# Patient Record
Sex: Female | Born: 1935
Health system: Southern US, Community
[De-identification: ages and names within clinical notes are randomized; demographics above are authoritative.]

## PROBLEM LIST (undated history)

## (undated) DIAGNOSIS — C50919 Malignant neoplasm of unspecified site of unspecified female breast: Secondary | ICD-10-CM

## (undated) DIAGNOSIS — I10 Essential (primary) hypertension: Secondary | ICD-10-CM

## (undated) DIAGNOSIS — E039 Hypothyroidism, unspecified: Secondary | ICD-10-CM

## (undated) DIAGNOSIS — E785 Hyperlipidemia, unspecified: Secondary | ICD-10-CM

## (undated) DIAGNOSIS — M1990 Unspecified osteoarthritis, unspecified site: Secondary | ICD-10-CM

## (undated) DIAGNOSIS — I219 Acute myocardial infarction, unspecified: Secondary | ICD-10-CM

## (undated) DIAGNOSIS — K5730 Diverticulosis of large intestine without perforation or abscess without bleeding: Secondary | ICD-10-CM

## (undated) DIAGNOSIS — K635 Polyp of colon: Secondary | ICD-10-CM

## (undated) DIAGNOSIS — I2510 Atherosclerotic heart disease of native coronary artery without angina pectoris: Secondary | ICD-10-CM

## (undated) DIAGNOSIS — D649 Anemia, unspecified: Secondary | ICD-10-CM

## (undated) HISTORY — DX: Atherosclerotic heart disease of native coronary artery without angina pectoris: I25.10

## (undated) HISTORY — DX: Anemia, unspecified: D64.9

## (undated) HISTORY — PX: HEMORRHOID SURGERY: SHX153

## (undated) HISTORY — PX: CHOLECYSTECTOMY: SHX55

## (undated) HISTORY — PX: TRANSTHORACIC ECHOCARDIOGRAM: SHX275

## (undated) HISTORY — DX: Hypothyroidism, unspecified: E03.9

## (undated) HISTORY — DX: Malignant neoplasm of unspecified site of unspecified female breast: C50.919

## (undated) HISTORY — PX: ABDOMINAL HYSTERECTOMY: SHX81

## (undated) HISTORY — DX: Hyperlipidemia, unspecified: E78.5

## (undated) HISTORY — DX: Polyp of colon: K63.5

## (undated) HISTORY — DX: Essential (primary) hypertension: I10

## (undated) HISTORY — DX: Acute myocardial infarction, unspecified: I21.9

## (undated) HISTORY — PX: COLONOSCOPY: SHX174

---

## 1971-06-12 DIAGNOSIS — I219 Acute myocardial infarction, unspecified: Secondary | ICD-10-CM

## 1971-06-12 HISTORY — DX: Acute myocardial infarction, unspecified: I21.9

## 2001-06-18 ENCOUNTER — Other Ambulatory Visit: Admission: RE | Admit: 2001-06-18 | Discharge: 2001-06-18 | Payer: Self-pay | Admitting: Family Medicine

## 2001-06-24 ENCOUNTER — Ambulatory Visit (HOSPITAL_COMMUNITY): Admission: RE | Admit: 2001-06-24 | Discharge: 2001-06-24 | Payer: Self-pay | Admitting: Family Medicine

## 2001-06-24 ENCOUNTER — Encounter: Payer: Self-pay | Admitting: Family Medicine

## 2001-07-08 ENCOUNTER — Encounter: Payer: Self-pay | Admitting: Family Medicine

## 2001-07-08 ENCOUNTER — Ambulatory Visit (HOSPITAL_COMMUNITY): Admission: RE | Admit: 2001-07-08 | Discharge: 2001-07-08 | Payer: Self-pay | Admitting: Family Medicine

## 2001-07-15 ENCOUNTER — Encounter: Payer: Self-pay | Admitting: Otolaryngology

## 2001-07-15 ENCOUNTER — Encounter: Admission: RE | Admit: 2001-07-15 | Discharge: 2001-07-15 | Payer: Self-pay | Admitting: Otolaryngology

## 2001-08-05 ENCOUNTER — Ambulatory Visit (HOSPITAL_COMMUNITY): Admission: RE | Admit: 2001-08-05 | Discharge: 2001-08-05 | Payer: Self-pay | Admitting: Cardiovascular Disease

## 2001-08-05 ENCOUNTER — Ambulatory Visit (HOSPITAL_COMMUNITY): Admission: RE | Admit: 2001-08-05 | Discharge: 2001-08-05 | Payer: Self-pay | Admitting: Cardiology

## 2001-08-05 ENCOUNTER — Encounter: Payer: Self-pay | Admitting: Cardiovascular Disease

## 2002-04-28 ENCOUNTER — Emergency Department (HOSPITAL_COMMUNITY): Admission: EM | Admit: 2002-04-28 | Discharge: 2002-04-28 | Payer: Self-pay | Admitting: *Deleted

## 2002-04-28 ENCOUNTER — Encounter: Payer: Self-pay | Admitting: *Deleted

## 2002-07-01 ENCOUNTER — Ambulatory Visit (HOSPITAL_COMMUNITY): Admission: RE | Admit: 2002-07-01 | Discharge: 2002-07-01 | Payer: Self-pay | Admitting: Internal Medicine

## 2002-07-01 ENCOUNTER — Encounter: Payer: Self-pay | Admitting: Internal Medicine

## 2002-07-16 ENCOUNTER — Ambulatory Visit (HOSPITAL_COMMUNITY): Admission: RE | Admit: 2002-07-16 | Discharge: 2002-07-16 | Payer: Self-pay | Admitting: Internal Medicine

## 2002-07-16 ENCOUNTER — Encounter: Payer: Self-pay | Admitting: Internal Medicine

## 2003-06-14 ENCOUNTER — Ambulatory Visit (HOSPITAL_COMMUNITY): Admission: RE | Admit: 2003-06-14 | Discharge: 2003-06-14 | Payer: Self-pay | Admitting: Internal Medicine

## 2003-07-07 ENCOUNTER — Inpatient Hospital Stay (HOSPITAL_COMMUNITY): Admission: RE | Admit: 2003-07-07 | Discharge: 2003-07-09 | Payer: Self-pay | Admitting: General Surgery

## 2004-04-12 ENCOUNTER — Ambulatory Visit (HOSPITAL_COMMUNITY): Admission: RE | Admit: 2004-04-12 | Discharge: 2004-04-12 | Payer: Self-pay | Admitting: Internal Medicine

## 2004-04-17 ENCOUNTER — Ambulatory Visit (HOSPITAL_COMMUNITY): Admission: RE | Admit: 2004-04-17 | Discharge: 2004-04-17 | Payer: Self-pay | Admitting: Internal Medicine

## 2004-05-11 ENCOUNTER — Ambulatory Visit (HOSPITAL_COMMUNITY): Admission: RE | Admit: 2004-05-11 | Discharge: 2004-05-11 | Payer: Self-pay | Admitting: General Surgery

## 2004-06-11 DIAGNOSIS — I2510 Atherosclerotic heart disease of native coronary artery without angina pectoris: Secondary | ICD-10-CM

## 2004-06-11 HISTORY — PX: CARDIAC CATHETERIZATION: SHX172

## 2004-06-11 HISTORY — DX: Atherosclerotic heart disease of native coronary artery without angina pectoris: I25.10

## 2004-07-18 ENCOUNTER — Observation Stay (HOSPITAL_COMMUNITY): Admission: RE | Admit: 2004-07-18 | Discharge: 2004-07-19 | Payer: Self-pay | Admitting: General Surgery

## 2005-01-12 ENCOUNTER — Ambulatory Visit (HOSPITAL_COMMUNITY): Admission: RE | Admit: 2005-01-12 | Discharge: 2005-01-12 | Payer: Self-pay | Admitting: Internal Medicine

## 2005-01-22 ENCOUNTER — Ambulatory Visit (HOSPITAL_COMMUNITY): Admission: RE | Admit: 2005-01-22 | Discharge: 2005-01-22 | Payer: Self-pay | Admitting: Cardiovascular Disease

## 2005-01-24 ENCOUNTER — Ambulatory Visit (HOSPITAL_COMMUNITY): Admission: RE | Admit: 2005-01-24 | Discharge: 2005-01-24 | Payer: Self-pay | Admitting: Cardiovascular Disease

## 2005-10-04 ENCOUNTER — Ambulatory Visit (HOSPITAL_COMMUNITY): Admission: RE | Admit: 2005-10-04 | Discharge: 2005-10-04 | Payer: Self-pay | Admitting: Internal Medicine

## 2006-06-11 HISTORY — PX: LAPAROSCOPIC LYSIS OF ADHESIONS: SHX5905

## 2006-10-16 ENCOUNTER — Ambulatory Visit (HOSPITAL_COMMUNITY): Admission: RE | Admit: 2006-10-16 | Discharge: 2006-10-16 | Payer: Self-pay | Admitting: Internal Medicine

## 2007-01-01 ENCOUNTER — Ambulatory Visit (HOSPITAL_COMMUNITY): Admission: RE | Admit: 2007-01-01 | Discharge: 2007-01-02 | Payer: Self-pay | Admitting: General Surgery

## 2007-06-16 ENCOUNTER — Ambulatory Visit (HOSPITAL_COMMUNITY): Admission: RE | Admit: 2007-06-16 | Discharge: 2007-06-16 | Payer: Self-pay | Admitting: Internal Medicine

## 2008-01-28 ENCOUNTER — Ambulatory Visit (HOSPITAL_COMMUNITY): Admission: RE | Admit: 2008-01-28 | Discharge: 2008-01-28 | Payer: Self-pay | Admitting: Internal Medicine

## 2008-02-03 ENCOUNTER — Ambulatory Visit (HOSPITAL_COMMUNITY): Admission: RE | Admit: 2008-02-03 | Discharge: 2008-02-03 | Payer: Self-pay | Admitting: Internal Medicine

## 2008-03-01 ENCOUNTER — Ambulatory Visit (HOSPITAL_COMMUNITY): Admission: RE | Admit: 2008-03-01 | Discharge: 2008-03-01 | Payer: Self-pay | Admitting: Internal Medicine

## 2008-06-21 ENCOUNTER — Ambulatory Visit (HOSPITAL_COMMUNITY): Admission: RE | Admit: 2008-06-21 | Discharge: 2008-06-21 | Payer: Self-pay | Admitting: Family Medicine

## 2009-02-16 ENCOUNTER — Ambulatory Visit (HOSPITAL_COMMUNITY): Admission: RE | Admit: 2009-02-16 | Discharge: 2009-02-16 | Payer: Self-pay | Admitting: Internal Medicine

## 2009-06-11 DIAGNOSIS — C50919 Malignant neoplasm of unspecified site of unspecified female breast: Secondary | ICD-10-CM

## 2009-06-11 HISTORY — PX: MASTECTOMY: SHX3

## 2009-06-11 HISTORY — DX: Malignant neoplasm of unspecified site of unspecified female breast: C50.919

## 2009-10-06 ENCOUNTER — Ambulatory Visit (HOSPITAL_COMMUNITY): Admission: RE | Admit: 2009-10-06 | Discharge: 2009-10-06 | Payer: Self-pay | Admitting: Family Medicine

## 2010-07-02 ENCOUNTER — Encounter: Payer: Self-pay | Admitting: Internal Medicine

## 2010-07-03 ENCOUNTER — Encounter: Payer: Self-pay | Admitting: Internal Medicine

## 2010-10-24 NOTE — Op Note (Signed)
NAMEMICHELENA, [name]                 ACCOUNT NO.:  [id number]   MEDICAL RECORD NO.:  [id number]          PATIENT TYPE:  OIB   LOCATION:  1529                         FACILITY:  [facility]   PHYSICIAN:  Adolph Pollack, M.D.DATE OF BIRTH:  August 05, 1932   DATE OF PROCEDURE:  01/01/2007  DATE OF DISCHARGE:                               OPERATIVE REPORT   PREOPERATIVE DIAGNOSIS:  Ventral incisional hernia.   POSTOPERATIVE DIAGNOSIS:  Ventral incisional hernia, extensive intra-  abdominal adhesions.   PROCEDURE:  Laparoscopic lysis of adhesions (2 hours) and repair of  ventral incisional hernia with mesh.   SURGEON:  Adolph Pollack, M.D.   ASSISTANT:  Karie Soda, MD   ANESTHESIA:  General.   INDICATIONS:  Angelica [name] is a 77 year old female who is undergoing work-  up for some pelvic floor issues and CT scan demonstrated ventral hernia  containing intestine.  She now presents for repair of this.  We have  gone over the procedure and risks preoperatively.   TECHNIQUE:  She is seen in the holding area and brought to the operating  room, placed supine on the operating table and general anesthetic was  administered.  Foley catheter was inserted and abdominal wall was  sterilely prepped and draped.  In the left lateral abdomen an incision  was made through the skin, subcutaneous tissue, fascial layers and the  peritoneal cavity was entered.  A pursestring suture of 0-0 Vicryl was  placed around the fascial edges.  A Hassan trocar was introduced into  the peritoneal cavity and pneumoperitoneum created by insufflation of  CO2 gas.  Laparoscope was introduced and extensive adhesions between the  omentum and abdominal wall were noted.  A 5 mm trocar was placed in the  left lower quadrant using cold scissors.  I lysed multiple adhesions to  abdominal wall from the left-side and approaching the midline.  At this  point I identified intestine which appeared to be up in the hernia.  There is  also small intestine densely adherent to the anterior abdominal  wall near the inferior aspect of the hernia.  Two 5 mm trocars were  replaced in the right mid lateral abdomen.  For two hours I subsequently  carefully lysed adhesions between the omentum and the abdominal wall and  the small intestine and the abdominal wall.  I was able to identify the  hernia sac and divided this and allow the transverse colon to drop back  into the anterior abdominal wall.  Last adhesions divided and small  bowel adhesions to the anterior abdominal wall allowing me to have an  adequate overlap space.  The adhesiolysis was performed for two hours.  I carefully inspected the bowel a number of times after this.  There is  no evidence of an enterotomy/bowel injury.  There was no significant  bleeding.   Following this I noted that the hernia defect just inferior to the  umbilicus and to the right of midline as well as a small defect superior  to this and inferior to it.  Using a spinal needle I marked the  edges of  the defect and measured 3-4 cm away from these.  The piece of mesh  needed was measured 15 x 13 cm and a brought a 20 x 15 cm piece of mesh  into the field which was Parietex with a nonadherent barrier.  I placed  four anchoring sutures of #1 Novofil in four quadrants of the mesh and  hydrated it and inserted into the peritoneal cavity.  The mesh was then  unrolled with a nonadherent barrier facing the viscera.  I then placed  four stab wounds around the four quadrant areas of the hernia and then  brought up each of the anchoring sutures across the fascial bridge, tied  these down anchoring the mesh to the anterior abdominal wall.  I then  further anchored the mesh to the anterior abdominal wall with a tacking  device with both an outer and inner rim circular layer of tacks.  This  provided more than adequate coverage of the hernia with adequate  overlap.   I once again inspected the area and no  bleeding was noted, no intestinal  injury was noted.  I irrigated out the abdominal cavity and evacuated  the fluid.  I then removed the [facility] trocar and closed the fascial  defect under laparoscopic vision by tightening up and tying down the  pursestring suture.  The remaining trocars were removed and the  pneumoperitoneum was released.   The skin incisions were closed with 4-0 Monocryl subcuticular stitches.  Steri-Strips and sterile dressings were applied.  She tolerated the  procedure well without any apparent complications and was taken to  recovery room in satisfactory condition.      Adolph Pollack, M.D.  Electronically Signed     TJR/MEDQ  D:  01/01/2007  T:  01/02/2007  Job:  [id number]   cc:   Bertram Millard. Dahlstedt, M.D.  Fax: [phone]   Madelin Rear. Sherwood Gambler, MD  Fax: [phone]

## 2010-10-27 NOTE — H&P (Signed)
NAME:  Angelica [name], Angelica [name]                 ACCOUNT NO.:  [id number]   MEDICAL RECORD NO.:  [id number]          PATIENT TYPE:  AMB   LOCATION:  DAY                           FACILITY:  [facility]   PHYSICIAN:  Leroy C. Katrinka Blazing, M.D.   DATE OF BIRTH:  1932/08/31   DATE OF ADMISSION:  DATE OF DISCHARGE:  LH                                HISTORY & PHYSICAL   HISTORY:  A 77 year old female with history of recurrent lower abdominal  pain with recurrent diarrhea.  She has had blood stains after bowel  movements.  She is scheduled for colonoscopy.   PAST HISTORY:  1.  Hypertension.  2.  Hyperlipidemia.  3.  Hypothyroidism.   SURGERY:  1.  Cholecystectomy.  2.  Bilateral breast biopsy.  3.  Hysterectomy.  4.  Partial thyroidectomy.   MEDICATIONS:  1.  Lipitor 10 mg q.h.s.  2.  Adalat 30 mg daily.  3.  Levoxyl 88 mcg daily.  4.  Atenolol 25 mg daily.  5.  Demadex 10 mg daily.  6.  KCl 10 mEq daily.   ALLERGIES:  1.  CIPRO.  2.  SULFA.   PHYSICAL EXAMINATION:  VITAL SIGNS:  Blood pressure 135/80, pulse 76,  respirations 20, weight 196 pounds.  HEENT:  Unremarkable.  NECK:  Supple without JVD or bruits.  CHEST:  Clear to auscultation.  HEART:  Regular rate and rhythm without murmur, gallop, or rub.  ABDOMEN:  Mild hypogastric tenderness.  No masses.  Normoactive bowel  sounds.  RECTAL:  Enlarged internal and external hemorrhoids.  No masses.  Stool is  guaiac positive.  EXTREMITIES:  No clubbing, cyanosis, or edema.  NEUROLOGIC:  No focal motor, sensory, or cerebellar deficit.   IMPRESSION:  1.  Chronic diarrhea.  2.  Internal and external hemorrhoids.  3.  Hypertension.  4.  Hyperlipidemia.  5.  Hypothyroidism.   PLAN:  Colonoscopy.     Lero   LCS/MEDQ  D:  05/10/2004  T:  05/10/2004  Job:  [id number]

## 2010-10-27 NOTE — Discharge Summary (Signed)
NAME:  Angelica [name], Angelica [name]                           ACCOUNT NO.:  [id number]   MEDICAL RECORD NO.:  [id number]                   PATIENT TYPE:  INP   LOCATION:  A329                                 FACILITY:  [facility]   PHYSICIAN:  Jerolyn Shin C. Katrinka Blazing, M.D.                DATE OF BIRTH:  06/24/1932   DATE OF ADMISSION:  07/06/2003  DATE OF DISCHARGE:  07/09/2003                                 DISCHARGE SUMMARY   DISCHARGE DIAGNOSES:  1. Cholelithiasis, cholecystitis.  2. Hypothyroidism.  3. Hypertension.   SPECIAL PROCEDURE:  Laparoscopic cholecystectomy on July 06, 2003.   DISPOSITION:  The patient is discharged home in stable and satisfactory  condition.   DISCHARGE MEDICATIONS:  1. Levaquin 500 mg daily.  2. Tylox 1 or 2 every 4 hours as needed for pain.  3. Lipitor 10 mg q.h.s.  4. Adalat __________ mg daily.  5. Levoxyl 88 mcg daily.  6. Atenolol 25 mg daily.  7. Demadex 10 mg daily.  8. Potassium chloride 10 mEq daily.   FOLLOW-UP:  The patient is scheduled to be seen in the office in 2 weeks.  She will have routine follow-up with Dr. Syliva Overman.   SUMMARY:  This is [name] 76 year old female with [name] history of right upper  quadrant with nausea, vomiting, and diarrhea.  She had intolerance to greasy  and fatty foods.  She had recurrent episodes of vomiting for the 2 days  prior to admission.  She had been symptomatic for over 5 years.  She was  noted to have gallstones in 2001 but delayed having surgery.  She consented  to surgery because of increasing pain.  The patient is followed primarily by  Dr. Syliva Overman.  Other problems include hypertension and  hypothyroidism.  On exam, abdominal exam was benign.  The patient was  admitted through day surgery and underwent laparoscopic cholecystectomy on  July 06, 2003, without complications.  She had mild ileus in the postoperative period  which delayed her discharge.  Her white count, however, remained normal.  She had  good return of bowel function by July 09, 2003, and it was  determined that she was stable enough to be discharged.  She was discharged  home in satisfactory condition.     ___________________________________________                                         Dirk Dress Katrinka Blazing, M.D.   LCS/MEDQ  D:  08/08/2003  T:  08/08/2003  Job:  [id number]   cc:   Milus Mallick. Lodema Hong, M.D.  [address]  [place], [place] [address]  Fax: [phone]

## 2010-10-27 NOTE — H&P (Signed)
NAME:  Angelica [name]                           ACCOUNT NO.:  [id number]   MEDICAL RECORD NO.:  [id number]                   PATIENT TYPE:  AMB   LOCATION:  DAY                                  FACILITY:  [facility]   PHYSICIAN:  Leroy C. Katrinka Blazing, M.D.                DATE OF BIRTH:  [birth year]   DATE OF ADMISSION:  DATE OF DISCHARGE:                                HISTORY & PHYSICAL   HISTORY OF PRESENT ILLNESS:  [age]-year-old female with history of  right upper quadrant pain with nausea, vomiting, increased diarrhea.  She  has intolerance to all greasy and fatty foods.  She has had recurrent  episodes of vomiting with her last episode of vomiting being 2 days prior to  admission.  The patient has been symptomatic for over 5 years.  She was  noted to have gallstones in 2001 but delayed having surgery.  She is now  having the surgery because of increasing symptoms.   PAST HISTORY:  She has hypertension, hypothyroidism and seasonal allergies.   PAST SURGICAL HISTORY:  Surgery includes hysterectomy, attempted thyroid  surgery; she had intraoperative cardiopulmonary problems which required  resuscitation; she never had thyroid surgery done.  She has had right and  left breast biopsy.   ALLERGIES:  Allergies are to CIPRO and SULFA.   MEDICATIONS:  1. Lipitor 10 mg nightly.  2. Adalat 30 mg daily.  3. Levoxyl 88 mcg daily.  4. Atenolol 25 mg daily.  5. Demadex 10 mg daily.  6. Potassium chloride 10 mEq daily.   PHYSICAL EXAMINATION:  VITAL SIGNS:  On examination, blood pressure 140/80,  pulse 68, respirations 20, weight 200 pounds.  HEENT:  Unremarkable.  NECK:  Neck is supple.  There is [name] well-healed incision.  Thyroid appears to  be normal without masses.  There is no adenopathy.  CHEST:  Chest clear to auscultation.  HEART:  Regular rate and rhythm without murmur, gallop or rub.  ABDOMEN:  Abdomen soft, nontender, no masses.  No right upper quadrant or  epigastric tenderness  noted.  EXTREMITIES:  Edema of the feet and ankles, 2+.  NEUROLOGIC:  No focal motor, sensory or cerebellar deficit.   IMPRESSION:  1. Cholelithiasis and cholecystitis.  2. Hypothyroidism.  3. Hypertension.  4. History of anesthetic complications, etiology unknown.   PLAN:  Laparoscopic cholecystectomy.     ___________________________________________                                         Dirk Dress Katrinka Blazing, M.D.   LCS/MEDQ  D:  07/06/2003  T:  07/06/2003  Job:  [id number]

## 2010-10-27 NOTE — Procedures (Signed)
[name], Angelica [name]                 ACCOUNT NO.:  [id number]   MEDICAL RECORD NO.:  [id number]          PATIENT TYPE:  OUT   LOCATION:  RAD                           FACILITY:  [facility]   PHYSICIAN:  Darlin Priestly, MD  DATE OF BIRTH:  Aug 23, 1932   DATE OF PROCEDURE:  01/12/2005  DATE OF DISCHARGE:                                  ECHOCARDIOGRAM   INDICATIONS FOR PROCEDURE:  Ms. Rahe is a 77 year old, female patient of  Dr. Sherwood Gambler with a history of hypertension, history of syncope, recent history  of CVA.  The patient now referred for 2D echocardiogram to evaluate LV  function, valve and structures.   M-MODE:  The aorta is within normal limits at 2.5 cm.   Left atrium is enlarged at 4.8 cm.  The patient in sinus rhythm during the  procedure.   IVS and LVPW are concentrically thickened at 1.4 cm and 1.4 cm respectively.   The aortic valve was minimally thickened with no evidence of significant  aortic stenosis or regurgitation.   There is mild thickening in the mitral valve leaflets with mild mitral  regurgitation.   Tricuspid valve with mild tricuspid regurgitation.   Mild pulmonic regurgitation.   Left ventricular internal dimensions are upper limits of normal at 5.1 cm  and 3.6 cm respectively.  There appears to be normal EF at approximately 50-  55% with no segmental wall motion abnormalities visualized.   Normal RV size and systolic function.   CONCLUSION:  1.  Concentric left ventricular hypertrophy with normal left ventricular      systolic function with estimated ejection fraction of 50-55%.  2.  Minimally thickened aortic valve with no evidence of significant aortic      stenosis or regurgitation.  3.  Mildly thickened mitral valve leaflets with mild mitral regurgitation.  4.  Normal tricuspid valve with mild tricuspid regurgitation.  5.  Mild pulmonic regurgitation.  6.  Mild left atrial enlargement.  7.  Normal left ventricular size and systolic function.  8.   No evidence of intracardiac mass or thrombus identified.      Darlin Priestly, MD  Electronically Signed     RHM/MEDQ  D:  01/12/2005  T:  01/12/2005  Job:  [id number]

## 2010-10-27 NOTE — Op Note (Signed)
NAMEMARGARETH, Angelica [name]                 ACCOUNT NO.:  [id number]   MEDICAL RECORD NO.:  [id number]          PATIENT TYPE:  OBV   LOCATION:  A337                          FACILITY:  [facility]   PHYSICIAN:  Dirk Dress. Katrinka Blazing, M.D.   DATE OF BIRTH:  May 05, 1933   DATE OF PROCEDURE:  07/18/2004  DATE OF DISCHARGE:  07/19/2004                                 OPERATIVE REPORT   PREOPERATIVE DIAGNOSIS:  Internal and external hemorrhoids.   POSTOPERATIVE DIAGNOSIS:  Internal and external hemorrhoids.   PROCEDURE:  Hemorrhoidectomy using the procedure for prolapsing hemorrhoids.   SURGEON:  Dirk Dress. Katrinka Blazing, M.D.   DESCRIPTION:  Under spinal anesthesia, the patient was placed in the prone  jackknife position.  The perianal area and anal canal was prepped and draped  in a sterile field.  Digital examination of anal canal was carried out. The  short anoscope was placed initially and the volume of the hemorrhoids was  assessed.  Local infiltration of the perianal area and the intersphincteric  area was locally infiltrated with 60 mL of 0.5% Marcaine with epinephrine.  The operating anoscope was introduced.  At about 8 cm from the anal verge, a  circumferential submucosal pursestring suture of 2-0 Prolene was placed.  The circular stapler was opened and then placed through the pursestring  suture.  The pursestring suture was then tied over the anvil of the stapler.  Being satisfied that there was complete at ring of tissue close to the  central portion of the handle, the stapler was closed.  It was held closed  for about two minutes.  It was then fired uneventfully.  It was opened and  then removed.  Inspection revealed a complete ring of tissue had been  removed.  Inspection of the anal canal revealed a complete circumferential  staple line about 3.5-4 cm above the dentate line.  The hemorrhoidal tissue  had been repositioned high in the anal canal.  There was no bleeding from  the staple line.  The procedure  was terminated.  A perineal pad was placed.  The patient was transferred to a stretcher and taken to the postanesthetic  care unit in satisfactory condition.      LCS/MEDQ  D:  08/27/2004  T:  08/28/2004  Job:  [id number]

## 2010-10-27 NOTE — Cardiovascular Report (Signed)
NAME:  Angelica [name], Angelica [name]                 ACCOUNT NO.:  [id number]   MEDICAL RECORD NO.:  [id number]          PATIENT TYPE:  OIB   LOCATION:  NA                           FACILITY:  [facility]   PHYSICIAN:  Nanetta Batty, M.D.   DATE OF BIRTH:  November 28, 1932   DATE OF PROCEDURE:  01/24/2005  DATE OF DISCHARGE:                              CARDIAC CATHETERIZATION   [name] is a pleasant 77 year old moderately overweight married African  American female, mother of two, grandmother of three, who was referred by  Dr. Effie Berkshire for evaluation of a positive Cardiolite stress test.  Her risk  factors include hypertension, hyperlipidemia, and family history.  She is a  retired Engineer, civil (consulting).  She was cathed back in the 80s.  She has noticed chest  discomfort occurring several times a week over the last several months,  occasionally awakening her from sleep.  Cardiolite stress test performed  January 17, 2005, showed anterior ischemia plus/minus breast attenuation  artifact.  She presents now for outpatient diagnostic coronary arteriography  to rule out ischemic etiology.   DESCRIPTION OF PROCEDURE:  The patient was brought to the second floor [facility] in the postabsorptive state.  She was  premedicated with p.o. Valium.  Her right groin was prepped and shaved in  the usual sterile fashion.  1% Xylocaine was used for local anesthesia.  A 6  French sheath was inserted into the right femoral artery using standard  Seldinger technique.  A 6 French right and left Judkins diagnostic catheters  as well as 6 French pigtail catheter were used for selective coronary  angiography, left ventriculography, distal abdominal aortography.  Visipaque  dye was used for the entirety of the case.  Retrograde aorta and left  ventricular pull back pressures were recorded.   HEMODYNAMIC DATA:  1.  Aortic systolic pressure 185, diastolic pressure 92.  2.  Left ventricular systolic pressure 183 and diastolic  pressure 9.   SELECTIVE CORONARY ANGIOGRAPHY:  1.  Left main normal.  2.  LAD normal.  3.  Left circumflex normal.  4.  Ramus intermedius branch normal.  5.  Right coronary artery was codominant with, at most, 40% stenosis in the      mid portion.   LEFT VENTRICULOGRAPHY:  RAO left ventriculogram was performed using 20 mL of  Visipaque dye at 10 mL per second.  The overall LVEF was greater than 50%  without focal wall motion abnormalities.   DISTAL ABDOMINAL AORTOGRAPHY:  Performed using 20 mL of Visipaque dye at 20  mL per second.  The renal arteries were widely patent.  The infrarenal  abdominal aorta and iliac bifurcation appears normal without changes.   IMPRESSION:  [name] has noncritical CAD with a less than 50% lesion in  the mid codominant right with normal LV function.  I believe her chest pain  is, most likely, noncardiac and the Cardiolite was false positive secondary  to breast attenuation artifact.  Continued medical therapy will be  recommended.   The sheath was removed and pressure was held on the groin to  achieve  hemostasis.  The patient left the lab in stable condition.  She will be  discharged home later today after remaining recumbent for five hours and I  will see her back in the office in approximately one week for follow up.  She left the lab in stable condition.      Nanetta Batty, M.D.  Electronically Signed     JB/MEDQ  D:  01/24/2005  T:  01/24/2005  Job:  [id number]

## 2010-10-27 NOTE — H&P (Signed)
NAMEZahara, Angelica [name]                 ACCOUNT NO.:  [id number]   MEDICAL RECORD NO.:  [id number]          PATIENT TYPE:  AMB   LOCATION:  DAY                           FACILITY:  [facility]   PHYSICIAN:  Leroy C. Katrinka Blazing, M.D.   DATE OF BIRTH:  1932-09-22   DATE OF ADMISSION:  DATE OF DISCHARGE:  LH                                HISTORY & PHYSICAL   HISTORY OF PRESENT ILLNESS:  A 77 year old female with a history of  recurrent rectal bleeding dating back to 2001.  She had a colonoscopy at  that time, and it showed internal and external hemorrhoids.  She has had  intermittent bleeding since then.  She presented with bloody diarrhea in  November of 2005.  It was noted on physical exam that she had large internal  and external hemorrhoids.  A colonoscopy revealed diverticulosis and  internal and external hemorrhoids.  The patient was advised to have surgery.  She has finally consented, and will have a hemorrhoidectomy.   PAST HISTORY:  1.  History of hypertension.  2.  Hypothyroidism.  3.  Hyperlipidemia.   SURGICAL HISTORY:  1.  Partial thyroidectomy.  2.  Hysterectomy.  3.  Bilateral breast biopsy.  4.  Cholecystectomy.   MEDICATIONS:  The most recent medications are not known, but as of December  2005, her medications were -  1.  Lipitor 10 mg q.h.s.  2.  Levoxyl 88 mcg daily.  3.  Adalat 20 mg daily.  4.  Atenolol 25 mg daily.  5.  Demadex 10 mg daily.  6.  Potassium chloride 10 mEq daily.   ALLERGIES:  1.  SULFA.  2.  CIPRO.   PHYSICAL EXAMINATION:  GENERAL:  The patient is in no acute distress.  VITAL SIGNS:  Blood pressure 140/80, pulse 92, respirations 20, weight 190  pounds.  HEENT:  Unremarkable.  NECK:  Supple without JVD, bruit, adenopathy.  She has diffuse thyromegaly.  CHEST:  Clear to auscultation.  HEART:  Regular rate and rhythm.  No murmur, gallop, or rub.  ABDOMEN:  Soft, nontender.  No masses.  Normoactive bowel sounds.  RECTAL:  Large internal and external  hemorrhoids.  No anorectal masses.  Stool was guaiac positive.  EXTREMITIES:  No joint deformity.  No clubbing, cyanosis, or edema.  NEUROLOGIC:  Cranial nerves II-XII are intact.  Deep tendon reflexes are  symmetric.  No focal motor, sensory, or cerebellar deficit.   IMPRESSION:  1.  Internal and external hemorrhoids.  2.  Hypertension.  3.  Hypothyroidism.  4.  Hyperlipidemia.   PLAN:  Hemorrhoidectomy.      LCS/MEDQ  D:  07/17/2004  T:  07/18/2004  Job:  [id number]

## 2010-10-27 NOTE — Op Note (Signed)
NAME:  Angelica [name], Angelica [name]                           ACCOUNT NO.:  [id number]   MEDICAL RECORD NO.:  [id number]                   PATIENT TYPE:  AMB   LOCATION:  DAY                                  FACILITY:  [facility]   PHYSICIAN:  Leroy C. Katrinka Blazing, M.D.                DATE OF BIRTH:  1932-09-20   DATE OF PROCEDURE:  DATE OF DISCHARGE:                                 OPERATIVE REPORT   PREOPERATIVE DIAGNOSES:  Cholelithiasis, cholecystitis.   POSTOPERATIVE DIAGNOSES:  Cholelithiasis, cholecystitis.   PROCEDURE:  Laparoscopic cholecystectomy.   SURGEON:  Dirk Dress. Katrinka Blazing, M.D.   DESCRIPTION OF PROCEDURE:  Under general endotracheal anesthesia, the  patient's abdomen was prepped and draped in [name] sterile field.  [name]  supraumbilical incision was made. [name] Veress needle was inserted uneventfully.  The abdomen was insufflated with 2.5 liters of CO2.  Using [name] Visiport guide,  [name] 10 mm port was placed.  The laparoscope was placed and gallbladder was  visualized.  Under videoscopic guidance, [name] 10 mm port and two 5 mm ports  were placed.  These were placed in the right subcostal region.  The  gallbladder was grasped and positioned.  The cystic artery was isolated,  dissected, clipped with three clips and divided. The cystic duct was  isolated, clipped close to the gallbladder with five clips and divided.  Using electrocautery, the gallbladder was then separated from the  infrahepatic bed without difficulty.  It was placed in an EndoCatch device  and retrieved. Irrigation was carried out.  There was no evidence of bile  leak.  The patient tolerated the procedure well.  Copious irrigation was  carried out.  CO2 was allowed to escape from the abdomen and the ports were  removed.  The fascia of the two larger incisions were closed with #0 Dexon.  The skin was closed with staples.  The patient tolerated the procedure well.  Sterile dressings were placed. She was awakened from anesthesia uneventfully  and  transferred to [name] bed and taken to the post anesthesia care unit for  further monitoring.      ___________________________________________                                            Dirk Dress. Katrinka Blazing, M.D.   LCS/MEDQ  D:  07/06/2003  T:  07/06/2003  Job:  [id number]

## 2011-03-07 ENCOUNTER — Other Ambulatory Visit (HOSPITAL_COMMUNITY): Payer: Self-pay | Admitting: Internal Medicine

## 2011-03-07 DIAGNOSIS — Z139 Encounter for screening, unspecified: Secondary | ICD-10-CM

## 2011-03-14 ENCOUNTER — Ambulatory Visit (INDEPENDENT_AMBULATORY_CARE_PROVIDER_SITE_OTHER): Payer: Medicare Other | Admitting: Gastroenterology

## 2011-03-14 ENCOUNTER — Encounter: Payer: Self-pay | Admitting: Gastroenterology

## 2011-03-14 VITALS — BP 135/70 | HR 67 | Temp 98.1°F | Ht 60.0 in | Wt 168.2 lb

## 2011-03-14 DIAGNOSIS — R1032 Left lower quadrant pain: Secondary | ICD-10-CM | POA: Insufficient documentation

## 2011-03-14 DIAGNOSIS — C50919 Malignant neoplasm of unspecified site of unspecified female breast: Secondary | ICD-10-CM | POA: Insufficient documentation

## 2011-03-14 DIAGNOSIS — K5730 Diverticulosis of large intestine without perforation or abscess without bleeding: Secondary | ICD-10-CM | POA: Insufficient documentation

## 2011-03-14 DIAGNOSIS — K625 Hemorrhage of anus and rectum: Secondary | ICD-10-CM

## 2011-03-14 DIAGNOSIS — Z860100 Personal history of colon polyps, unspecified: Secondary | ICD-10-CM | POA: Insufficient documentation

## 2011-03-14 DIAGNOSIS — K5732 Diverticulitis of large intestine without perforation or abscess without bleeding: Secondary | ICD-10-CM

## 2011-03-14 DIAGNOSIS — R197 Diarrhea, unspecified: Secondary | ICD-10-CM

## 2011-03-14 NOTE — Progress Notes (Signed)
Primary Care Physician:  Cassell Smiles., MD  Primary Gastroenterologist:  Jonette Odell, MD   Chief Complaint  Patient presents with  . Colonoscopy    HPI:  Angelica [name] is a 77 y.o. female here for consideration of a colonoscopy. She states that Dr. Cleone Slim and Dr. Sherwood Gambler both want her to have one. She's been putting it off for several months. She states she has recurrent episodes of diverticulitis, the last one was in April. She has been treated with multiple rounds of antibiotics, empirically. Treated with antibiotics four times over the past 1-2 years. About five episodes all together. Never been hospitalized for it.   LLQ pain begins and will last for several days until she takes antibiotics. Seems to happen more with increased activity and walking[place]. Once LLQ begins she usually develops diarrhea. Afraid to travel. BRBPR. Used to be just a spot on tissue. Now more. No recent CT, last one 5-6 years ago. Lactose intolerant.   Pain back today. Just started this morning. No fever. BM daily with intermittent diarrhea. No constipation. Some nocturnal diarrhea. No melena. Occasional heartburn. No dysphagia. No odynophagia. Weight down some, has to have synthroid adjusted. Appointment to see Dr. Patrecia Pace soon.   H/O colon polyps (Dr. Elpidio Anis).  Current Outpatient Prescriptions  Medication Sig Dispense Refill  . allopurinol (ZYLOPRIM) 300 MG tablet Take 300 mg by mouth daily.        Marland Kitchen amLODipine (NORVASC) 2.5 MG tablet Take 2.5 mg by mouth daily.        Marland Kitchen aspirin 81 MG tablet Take 81 mg by mouth daily.        Marland Kitchen atorvastatin (LIPITOR) 10 MG tablet Take 10 mg by mouth daily.        . enalapril-hydrochlorothiazide (VASERETIC) 10-25 MG per tablet Take 1 tablet by mouth daily.        . furosemide (LASIX) 20 MG tablet Take 20 mg by mouth 2 (two) times daily.        Marland Kitchen levothyroxine (SYNTHROID, LEVOTHROID) 50 MCG tablet Take 50 mcg by mouth daily.        .  metoprolol (LOPRESSOR) 50 MG tablet Take 50 mg by mouth 2 (two) times daily.          Allergies as of 03/14/2011 - Review Complete 03/14/2011  Allergen Reaction Noted  . Ciprofloxacin Nausea And Vomiting 03/14/2011  . Cleocin  03/14/2011  . Flagyl (metronidazole hcl) Nausea And Vomiting 03/14/2011  . Sulfa antibiotics Nausea Only 03/14/2011    Past Medical History  Diagnosis Date  . MI (myocardial infarction)   . HTN (hypertension)   . Hypothyroid   . Hyperlipidemia   . Anemia   . Gout   . Breast cancer 2011    no adjuvent therapy, Dr. Cleone Slim  . Colon polyps     Previous colonoscopy by Dr. Elpidio Anis    Past Surgical History  Procedure Date  . Abdominal hysterectomy   . Cholecystectomy   . Hemorrhoid surgery   . Mastectomy 2011    left  . Colonoscopy      ?3 years ago. Per pt, diverticulosis, hemorrhoids    Family History  Problem Relation Age of Onset  . Prostate cancer Father   . Breast cancer Sister   . Colon cancer Neg Hx     History   Social History  . Marital Status: Married    Spouse Name: N/A    Number of Children:  2  . Years of Education: N/A   Occupational History  . retired    Social History Main Topics  . Smoking status: Never Smoker   . Smokeless tobacco: Not on file  . Alcohol Use: No  . Drug Use: No  . Sexually Active: Not on file   Other Topics Concern  . Not on file   Social History Narrative  . No narrative on file      ROS:  General: Negative for anorexia, fever, chills, fatigue, weakness. Eyes: Negative for vision changes.  ENT: Negative for hoarseness, difficulty swallowing , nasal congestion. CV: Negative for chest pain, angina, palpitations, dyspnea on exertion, peripheral edema.  Respiratory: Negative for dyspnea at rest, dyspnea on exertion, cough, sputum, wheezing.  GI: See history of present illness. GU:  Negative for dysuria, hematuria, urinary incontinence, urinary frequency, nocturnal urination.  MS: Negative  for joint pain, low back pain.  Derm: Negative for rash or itching.  Neuro: Negative for weakness, abnormal sensation, seizure, frequent headaches, memory loss, confusion.  Psych: Negative for anxiety, depression, suicidal ideation, hallucinations.  Endo: See HPI.  Heme: Negative for bruising or bleeding. Allergy: Negative for rash or hives.    Physical Examination:  BP 135/70  Pulse 67  Temp(Src) 98.1 F (36.7 C) (Temporal)  Ht 5' (1.524 m)  Wt 168 lb 3.2 oz (76.295 kg)  BMI 32.85 kg/m2   General: Well-nourished, well-developed in no acute distress.  Head: Normocephalic, atraumatic.   Eyes: Conjunctiva pink, no icterus. Mouth: Oropharyngeal mucosa moist and pink , no lesions erythema or exudate. Neck: Supple without thyromegaly, masses, or lymphadenopathy.  Lungs: Clear to auscultation bilaterally.  Heart: Regular rate and rhythm, no murmurs rubs or gallops.  Abdomen: Bowel sounds are normal, Minimal LLQ tenderness, nondistended, no hepatosplenomegaly or masses, no abdominal bruits or hernia , no rebound or guarding.   Rectal: Defer to time of procedure. Extremities: No lower extremity edema. No clubbing or deformities.  Neuro: Alert and oriented x 4 , grossly normal neurologically.  Skin: Warm and dry, no rash or jaundice.   Psych: Alert and cooperative, normal mood and affect.

## 2011-03-14 NOTE — Progress Notes (Signed)
Cc to PCP 

## 2011-03-14 NOTE — Assessment & Plan Note (Signed)
History of recurrent left lower quadrant abdominal pain associated with diarrhea and rectal bleeding. She has a history of multiple bouts of diverticulitis treated empirically with antibiotics. She denies any CT evidence of diverticulitis and has never been hospitalized for it. Last colonoscopy several years ago. Prior colonoscopy history of colon polyps. Personal history of breast cancer last year treated with mastectomy.  She may have recurrent diverticulitis however cannot rule out other etiology such as IBD, IBS with hemorrhoid bleeding, microscopic colitis, malignancy. Recommend colonoscopy.  I have discussed the risks, alternatives, benefits with regards to but not limited to the risk of reaction to medication, bleeding, infection, perforation and the patient is agreeable to proceed. Written consent to be obtained.  She started having more left lower quadrant pain today. I have advised her to call us tomorrow if pain persist and/or progresses. If it does we will pursue a CT of the abdomen and pelvis with IV and oral contrast in order to try to document any possibilities of diverticulitis. She understands that if her abdominal pain persist she needs to let us know as we would not want to pursue colonoscopy during active diverticulitis.

## 2011-03-14 NOTE — Patient Instructions (Addendum)
Please call us in the morning if you continue to have left-sided abdominal pain. We have scheduled you for a colonoscopy with Dr. Darrick Penna. Please see separate instructions. We will request a copy of your recent lab work from Dr. Sherwood Gambler.

## 2011-03-15 ENCOUNTER — Telehealth: Payer: Self-pay | Admitting: Gastroenterology

## 2011-03-15 DIAGNOSIS — R1032 Left lower quadrant pain: Secondary | ICD-10-CM

## 2011-03-15 NOTE — Telephone Encounter (Signed)
Pt is scheduled for 03/19/11 @ 1:45- she is aware

## 2011-03-15 NOTE — Telephone Encounter (Signed)
Patient an called and stated she is still having a lot of pain in the left side of her abdomin and lower abdomin and having diarrhea. Please advise!

## 2011-03-15 NOTE — Telephone Encounter (Signed)
Called and spoke with pt. She said her abdominal pain on the left side was worse yesterday afternoon and the diarrhea was worse yesterday afternoon. She is still having some pain this morning, but nothing like it was yesterday. Also, some loose stool, but not much today. She wanted to let Tana Coast, PA know, and find out what she advises.

## 2011-03-15 NOTE — Telephone Encounter (Signed)
Pt aware CT will be scheduled.

## 2011-03-15 NOTE — Telephone Encounter (Signed)
Let's go ahead and get at CT of abd/pelvis with IV and oral contrast for LLQ pain and h/o diverticulitis.

## 2011-03-16 ENCOUNTER — Ambulatory Visit (HOSPITAL_COMMUNITY)
Admission: RE | Admit: 2011-03-16 | Discharge: 2011-03-16 | Disposition: A | Discharge status: Home / Self Care | Payer: Medicare Other | Source: Ambulatory Visit | Attending: Internal Medicine | Admitting: Internal Medicine

## 2011-03-16 DIAGNOSIS — Z780 Asymptomatic menopausal state: Secondary | ICD-10-CM | POA: Insufficient documentation

## 2011-03-16 DIAGNOSIS — M899 Disorder of bone, unspecified: Secondary | ICD-10-CM | POA: Insufficient documentation

## 2011-03-16 DIAGNOSIS — Z13820 Encounter for screening for osteoporosis: Secondary | ICD-10-CM | POA: Insufficient documentation

## 2011-03-16 DIAGNOSIS — M949 Disorder of cartilage, unspecified: Secondary | ICD-10-CM | POA: Insufficient documentation

## 2011-03-16 DIAGNOSIS — Z139 Encounter for screening, unspecified: Secondary | ICD-10-CM

## 2011-03-19 ENCOUNTER — Ambulatory Visit (HOSPITAL_COMMUNITY): Payer: Self-pay

## 2011-03-19 ENCOUNTER — Ambulatory Visit (HOSPITAL_COMMUNITY)
Admission: RE | Admit: 2011-03-19 | Discharge: 2011-03-19 | Disposition: A | Discharge status: Home / Self Care | Payer: Medicare Other | Source: Ambulatory Visit | Attending: Gastroenterology | Admitting: Gastroenterology

## 2011-03-19 DIAGNOSIS — K5730 Diverticulosis of large intestine without perforation or abscess without bleeding: Secondary | ICD-10-CM | POA: Insufficient documentation

## 2011-03-19 DIAGNOSIS — R1032 Left lower quadrant pain: Secondary | ICD-10-CM

## 2011-03-19 MED ORDER — IOHEXOL 300 MG/ML  SOLN: 100.0000 mL | Freq: Once | INTRAMUSCULAR | Status: AC | PRN

## 2011-03-20 NOTE — Progress Notes (Signed)
REVIEWED. AGREE. 

## 2011-03-20 NOTE — Progress Notes (Signed)
NOTED

## 2011-03-20 NOTE — Progress Notes (Signed)
Quick Note:  Please let pt know. No CT evidence of diverticulitis. Proceed with scheduled colonoscopy. ______

## 2011-03-20 NOTE — Progress Notes (Signed)
Quick Note:  Pt informed ______ 

## 2011-03-26 LAB — CBC
HCT: 37.8
Hemoglobin: 12.8
MCHC: 34
MCV: 91.9
RBC: 4.11
WBC: 9.3

## 2011-03-26 LAB — DIFFERENTIAL
Basophils Relative: 0
Eosinophils Absolute: 0.2
Monocytes Absolute: 1 — ABNORMAL HIGH
Monocytes Relative: 11

## 2011-03-26 LAB — COMPREHENSIVE METABOLIC PANEL
AST: 40 — ABNORMAL HIGH
BUN: 24 — ABNORMAL HIGH
CO2: 33 — ABNORMAL HIGH
Calcium: 9.7
Chloride: 98
Creatinine, Ser: 1.26 — ABNORMAL HIGH
Glucose, Bld: 112 — ABNORMAL HIGH

## 2011-04-05 MED ORDER — SODIUM CHLORIDE 0.45 % IV SOLN
Freq: Once | INTRAVENOUS | Status: AC
  Administered 2011-04-06: 09:00:00 via INTRAVENOUS

## 2011-04-06 ENCOUNTER — Ambulatory Visit (HOSPITAL_COMMUNITY)
Admission: RE | Admit: 2011-04-06 | Discharge: 2011-04-06 | Disposition: A | Discharge status: Home / Self Care | Payer: Medicare Other | Source: Ambulatory Visit | Attending: Gastroenterology | Admitting: Gastroenterology

## 2011-04-06 ENCOUNTER — Other Ambulatory Visit: Payer: Self-pay | Admitting: Gastroenterology

## 2011-04-06 ENCOUNTER — Encounter (HOSPITAL_COMMUNITY): Payer: Self-pay | Admitting: *Deleted

## 2011-04-06 ENCOUNTER — Encounter (HOSPITAL_COMMUNITY)
Admission: RE | Disposition: A | Discharge status: Home / Self Care | Payer: Self-pay | Source: Ambulatory Visit | Attending: Gastroenterology

## 2011-04-06 DIAGNOSIS — K648 Other hemorrhoids: Secondary | ICD-10-CM

## 2011-04-06 DIAGNOSIS — D126 Benign neoplasm of colon, unspecified: Secondary | ICD-10-CM

## 2011-04-06 DIAGNOSIS — K5730 Diverticulosis of large intestine without perforation or abscess without bleeding: Secondary | ICD-10-CM

## 2011-04-06 DIAGNOSIS — R1032 Left lower quadrant pain: Secondary | ICD-10-CM

## 2011-04-06 DIAGNOSIS — R197 Diarrhea, unspecified: Secondary | ICD-10-CM

## 2011-04-06 DIAGNOSIS — Z86010 Personal history of colonic polyps: Secondary | ICD-10-CM

## 2011-04-06 DIAGNOSIS — K625 Hemorrhage of anus and rectum: Secondary | ICD-10-CM

## 2011-04-06 HISTORY — PX: COLONOSCOPY: SHX5424

## 2011-04-06 SURGERY — COLONOSCOPY
Anesthesia: Moderate Sedation

## 2011-04-06 MED ORDER — MEPERIDINE HCL 100 MG/ML IJ SOLN
INTRAMUSCULAR | Status: AC
  Filled 2011-04-06: qty 1

## 2011-04-06 MED ORDER — MEPERIDINE HCL 100 MG/ML IJ SOLN
INTRAMUSCULAR | Status: DC | PRN
  Administered 2011-04-06: 25 mg via INTRAVENOUS

## 2011-04-06 MED ORDER — MIDAZOLAM HCL 5 MG/5ML IJ SOLN
INTRAMUSCULAR | Status: DC | PRN
  Administered 2011-04-06 (×4): 1 mg via INTRAVENOUS

## 2011-04-06 MED ORDER — MIDAZOLAM HCL 5 MG/5ML IJ SOLN
INTRAMUSCULAR | Status: AC
  Filled 2011-04-06: qty 10

## 2011-04-06 NOTE — Interval H&P Note (Signed)
History and Physical Interval Note:   04/06/2011   9:15 AM   Angelica [name]  has presented today for surgery, with the diagnosis of RECTAL BLEEDING DIARRHEA  AND LLQ PAIN  The various methods of treatment have been discussed with the patient and family. After consideration of risks, benefits and other options for treatment, the patient has consented to  Procedure(s): COLONOSCOPY as a surgical intervention .  The patients' history has been reviewed, patient examined, no change in status, stable for surgery.  I have reviewed the patients' chart and labs.  Questions were answered to the patient's satisfaction.     [name]  MD  THE PATIENT WAS EXAMINED AND THERE IS NO CHANGE IN THE PATIENT'S CONDITION SINCE THE ORIGINAL H&P WAS COMPLETED.

## 2011-04-06 NOTE — H&P (Signed)
Reason for Visit     Colonoscopy        Vitals - Last Recorded       BP Pulse Temp(Src) Ht Wt BMI    135/70  67  98.1 F (36.7 C) (Temporal)  5' (1.524 m)  168 lb 3.2 oz (76.295 kg)  32.85 kg/m2       Progress Notes     Tana Coast, PA  03/14/2011 10:52 AM  Signed Primary Care Physician:  Cassell Smiles., MD   Primary Gastroenterologist:  Jonette Kandis, MD      Chief Complaint   Patient presents with   .  Colonoscopy      HPI:  Angelica [name] is a 77 y.o. female here for consideration of a colonoscopy. She states that Dr. Cleone Slim and Dr. Sherwood Gambler both want her to have one. She's been putting it off for several months. She states she has recurrent episodes of diverticulitis, the last one was in April. She has been treated with multiple rounds of antibiotics, empirically. Treated with antibiotics four times over the past 1-2 years. About five episodes all together. Never been hospitalized for it.    LLQ pain begins and will last for several days until she takes antibiotics. Seems to happen more with increased activity and walking. [place] and is very [place]. Once LLQ begins she usually develops diarrhea. Afraid to travel. BRBPR. Used to be just a spot on tissue. Now more. No recent CT, last one 5-6 years ago. Lactose intolerant.    Pain back today. Just started this morning. No fever. BM daily with intermittent diarrhea. No constipation. Some nocturnal diarrhea. No melena. Occasional heartburn. No dysphagia. No odynophagia. Weight down some, has to have synthroid adjusted. Appointment to see Dr. Patrecia Pace soon.    H/O colon polyps (Dr. Elpidio Anis).    Current Outpatient Prescriptions   Medication  Sig  Dispense  Refill   .  allopurinol (ZYLOPRIM) 300 MG tablet  Take 300 mg by mouth daily.           Marland Kitchen  amLODipine (NORVASC) 2.5 MG tablet  Take 2.5 mg by mouth daily.           Marland Kitchen  aspirin 81 MG tablet  Take 81 mg by mouth daily.           Marland Kitchen  atorvastatin (LIPITOR) 10 MG  tablet  Take 10 mg by mouth daily.           .  enalapril-hydrochlorothiazide (VASERETIC) 10-25 MG per tablet  Take 1 tablet by mouth daily.           .  furosemide (LASIX) 20 MG tablet  Take 20 mg by mouth 2 (two) times daily.           Marland Kitchen  levothyroxine (SYNTHROID, LEVOTHROID) 50 MCG tablet  Take 50 mcg by mouth daily.           .  metoprolol (LOPRESSOR) 50 MG tablet  Take 50 mg by mouth 2 (two) times daily.               Allergies as of 03/14/2011 - Review Complete 03/14/2011   Allergen  Reaction  Noted   .  Ciprofloxacin  Nausea And Vomiting  03/14/2011   .  Cleocin    03/14/2011   .  Flagyl (metronidazole hcl)  Nausea And Vomiting  03/14/2011   .  Sulfa antibiotics  Nausea Only  03/14/2011  Past Medical History   Diagnosis  Date   .  MI (myocardial infarction)     .  HTN (hypertension)     .  Hypothyroid     .  Hyperlipidemia     .  Anemia     .  Gout     .  Breast cancer  2011       no adjuvent therapy, Dr. Cleone Slim   .  Colon polyps         Previous colonoscopy by Dr. Elpidio Anis       Past Surgical History   Procedure  Date   .  Abdominal hysterectomy     .  Cholecystectomy     .  Hemorrhoid surgery     .  Mastectomy  2011       left   .  Colonoscopy          ?3 years ago. Per pt, diverticulosis, hemorrhoids       Family History   Problem  Relation  Age of Onset   .  Prostate cancer  Father     .  Breast cancer  Sister     .  Colon cancer  Neg Hx         History       Social History   .  Marital Status:  Married       Spouse Name:  N/A       Number of Children:  2   .  Years of Education:  N/A       Occupational History   .  retired         Social History Main Topics   .  Smoking status:  Never Smoker    .  Smokeless tobacco:  Not on file   .  Alcohol Use:  No   .  Drug Use:  No   .  Sexually Active:  Not on file       Other Topics  Concern   .  Not on file       Social History Narrative   .  No narrative on file         ROS:   General: Negative for anorexia, fever, chills, fatigue, weakness. Eyes: Negative for vision changes.   ENT: Negative for hoarseness, difficulty swallowing , nasal congestion. CV: Negative for chest pain, angina, palpitations, dyspnea on exertion, peripheral edema.   Respiratory: Negative for dyspnea at rest, dyspnea on exertion, cough, sputum, wheezing.   GI: See history of present illness. GU:  Negative for dysuria, hematuria, urinary incontinence, urinary frequency, nocturnal urination.   MS: Negative for joint pain, low back pain.   Derm: Negative for rash or itching.   Neuro: Negative for weakness, abnormal sensation, seizure, frequent headaches, memory loss, confusion.   Psych: Negative for anxiety, depression, suicidal ideation, hallucinations.   Endo: See HPI.   Heme: Negative for bruising or bleeding. Allergy: Negative for rash or hives.     Physical Examination:   BP 135/70  Pulse 67  Temp(Src) 98.1 F (36.7 C) (Temporal)  Ht 5' (1.524 m)  Wt 168 lb 3.2 oz (76.295 kg)  BMI 32.85 kg/m2    General: Well-nourished, well-developed in no acute distress.   Head: Normocephalic, atraumatic.    Eyes: Conjunctiva pink, no icterus. Mouth: Oropharyngeal mucosa moist and pink , no lesions erythema or exudate. Neck: Supple without thyromegaly, masses, or lymphadenopathy.   Lungs: Clear to auscultation bilaterally.  Heart: Regular rate and rhythm, no murmurs rubs or gallops.   Abdomen: Bowel sounds are normal, Minimal LLQ tenderness, nondistended, no hepatosplenomegaly or masses, no abdominal bruits or hernia , no rebound or guarding.   Rectal: Defer to time of procedure. Extremities: No lower extremity edema. No clubbing or deformities.   Neuro: Alert and oriented x 4 , grossly normal neurologically.   Skin: Warm and dry, no rash or jaundice.    Psych: Alert and cooperative, normal mood and affect.   Glendora Score  03/14/2011 11:42 AM  Signed Cc to PCP  Jonette Bleu, MD  03/20/2011  9:58 AM  Signed REVIEWED. AGREE.      LLQ pain - Tana Coast, PA  03/14/2011 10:52 AM  Signed History of recurrent left lower quadrant abdominal pain associated with diarrhea and rectal bleeding. She has a history of multiple bouts of diverticulitis treated empirically with antibiotics. She denies any CT evidence of diverticulitis and has never been hospitalized for it. Last colonoscopy several years ago. Prior colonoscopy history of colon polyps. Personal history of breast cancer last year treated with mastectomy.   She may have recurrent diverticulitis however cannot rule out other etiology such as IBD, IBS with hemorrhoid bleeding, microscopic colitis, malignancy. Recommend colonoscopy.  I have discussed the risks, alternatives, benefits with regards to but not limited to the risk of reaction to medication, bleeding, infection, perforation and the patient is agreeable to proceed. Written consent to be obtained.   She started having more left lower quadrant pain today. I have advised her to call us tomorrow if pain persist and/or progresses. If it does we will pursue a CT of the abdomen and pelvis with IV and oral contrast in order to try to document any possibilities of diverticulitis. She understands that if her abdominal pain persist she needs to let us know as we would not want to pursue colonoscopy during active diverticulitis.

## 2011-04-09 ENCOUNTER — Telehealth: Payer: Self-pay

## 2011-04-09 NOTE — Telephone Encounter (Signed)
Called pt. Having problems with pain in her across the bottom & left side of her abdomen. Went all night and then saw Dr. Sherwood Gambler on SAT. Pt was told she had diverticulitis. Explained diverticulitis is not usually related to having a colonoscopy and I did not appreciate any inflammation on her exam. I would follow Dr. Sharyon Medicus recommendations. Pt's Sx improved after Cephalexin. She had simple adenomas removed from her colon. High fiber diet after she completes her Abx.

## 2011-04-09 NOTE — Telephone Encounter (Signed)
Pt called and said she tried to call over the week-end. We were closed and she went to see Dr. Sherwood Gambler, PCP. He gave her antibiotics, Cephalexin 500 mg to take one tablet qid  For 7 days. (she said she was diagnosed with diverticulitis when Dr. Darrick Penna did procedure)

## 2011-04-11 ENCOUNTER — Encounter (HOSPITAL_COMMUNITY): Payer: Self-pay | Admitting: Gastroenterology

## 2011-05-22 LAB — CREATININE, SERUM: Creat: 0.76 mg/dL (ref 0.50–1.10)

## 2011-06-26 ENCOUNTER — Encounter (HOSPITAL_COMMUNITY): Payer: Self-pay | Admitting: Pharmacy Technician

## 2011-06-26 DIAGNOSIS — H25019 Cortical age-related cataract, unspecified eye: Secondary | ICD-10-CM | POA: Diagnosis not present

## 2011-06-26 DIAGNOSIS — H2510 Age-related nuclear cataract, unspecified eye: Secondary | ICD-10-CM | POA: Diagnosis not present

## 2011-06-26 DIAGNOSIS — H538 Other visual disturbances: Secondary | ICD-10-CM | POA: Diagnosis not present

## 2011-06-28 ENCOUNTER — Other Ambulatory Visit: Payer: Self-pay

## 2011-06-28 ENCOUNTER — Encounter (HOSPITAL_COMMUNITY)
Admission: RE | Admit: 2011-06-28 | Discharge: 2011-06-28 | Disposition: A | Discharge status: Home / Self Care | Payer: Medicare Other | Source: Ambulatory Visit | Attending: Ophthalmology | Admitting: Ophthalmology

## 2011-06-28 ENCOUNTER — Encounter (HOSPITAL_COMMUNITY): Payer: Self-pay

## 2011-06-28 DIAGNOSIS — I10 Essential (primary) hypertension: Secondary | ICD-10-CM | POA: Diagnosis not present

## 2011-06-28 DIAGNOSIS — Z79899 Other long term (current) drug therapy: Secondary | ICD-10-CM | POA: Diagnosis not present

## 2011-06-28 DIAGNOSIS — Z01812 Encounter for preprocedural laboratory examination: Secondary | ICD-10-CM | POA: Diagnosis not present

## 2011-06-28 DIAGNOSIS — H2510 Age-related nuclear cataract, unspecified eye: Secondary | ICD-10-CM | POA: Diagnosis not present

## 2011-06-28 HISTORY — DX: Unspecified osteoarthritis, unspecified site: M19.90

## 2011-06-28 HISTORY — DX: Diverticulosis of large intestine without perforation or abscess without bleeding: K57.30

## 2011-06-28 LAB — BASIC METABOLIC PANEL
BUN: 20 mg/dL (ref 6–23)
Chloride: 98 mEq/L (ref 96–112)
GFR calc Af Amer: >90 mL/min (ref >90)
Potassium: 4.3 mEq/L (ref 3.5–5.1)
Sodium: 139 mEq/L (ref 135–145)

## 2011-06-28 LAB — HEMOGLOBIN AND HEMATOCRIT, BLOOD: Hemoglobin: 12.8 g/dL (ref 12.0–15.0)

## 2011-06-28 MED ORDER — CYCLOPENTOLATE-PHENYLEPHRINE 0.2-1 % OP SOLN
OPHTHALMIC | Status: AC
  Filled 2011-06-28: qty 2

## 2011-06-28 NOTE — Patient Instructions (Addendum)
[address] SANSKRITI GREENLAW  06/28/2011   Your procedure is scheduled on:  Monday, 07/02/11  Report to [facility] at 1000 AM.  Call this number if you have problems the morning of surgery: [phone]   Remember:   Do not eat food:After Midnight.  May have clear liquids:until Midnight .  Clear liquids include soda, tea, black coffee, apple or grape juice, broth.  Take these medicines the morning of surgery with A SIP OF WATER: norvasc (amlodipine), vaserteric (enalapril-hydrochlorothiazide), levothyroxine, metoprolol   Do not wear jewelry, make-up or nail polish.  Do not wear lotions, powders, or perfumes. You may wear deodorant.  Do not bring valuables to the hospital.  Contacts, dentures or bridgework may not be worn into surgery.     Patients discharged the day of surgery will not be allowed to drive home.  Name and phone number of your driver: driver  Special Instructions: Use eye drops as instructed.   Please read over the following fact sheets that you were given: Care and Recovery After Surgery    Cataract A cataract is a clouding of the lens of the eye. It is most often related to aging. A cataract is not a "film" over the surface of the eye. The lens is inside the eye and changes size of the pupil. The lens can enlarge to let more light enter the eye in dark environments and contract the size of the pupil to let in bright light. The lens is the part of the eye that helps focus light on the retina. The retina is the eye's light-sensitive layer. It is in the back of the eye that sends visual signals to the brain. In a normal eye, light passes through the lens and gets focused on the retina. To help produce a sharp image, the lens must remain clear. When a lens becomes cloudy, vision is compromised by the degree and nature of the clouding. Certain cataracts make people more near-sighted as they develop, others increase glare, and all reduce vision to some degree or another. A cataract that is so  dense that it becomes milky white and a white opacity can be seen through the pupil. When the white color is seen, it is called a "mature" or "hyper-mature cataract." Such cataracts cause total blindness in the affected eye. The cataract must be removed to prevent damage to the eye itself. Some types of cataracts can cause a secondary disease of the eye, such as certain types of glaucoma. In the early stages, better lighting and eyeglasses may lessen vision problems caused by cataracts. At a certain point, surgery may be needed to improve vision. CAUSES   Aging. However, cataracts may occur at any age, even in newborns.   Certain drugs.   Trauma to the eye.   Certain diseases (such as diabetes).   Inherited or acquired medical syndromes.  SYMPTOMS   Gradual, progressive drop in vision in the affected eye. Cataracts may develop at different rates in each eye. Cataracts may even be in just one eye with the other unaffected.   Cataracts due to trauma may develop quickly, sometimes over a matter or days or even hours. The result is severe and rapid visual loss.  DIAGNOSIS  To detect a cataract, an eye doctor examines the lens. A well developed cataract can be diagnosed without dilating the pupil. Early cataracts and others of a specific nature are best diagnosed with an exam of the eyes with the pupils dilated by drops. TREATMENT  For an early cataract, vision may improve by using different eyeglasses or stronger lighting.   If the above measures do not help, surgery is the only effective treatment. This treatment removes the cloudy lens and replaces it with a substitute lens (Intraocular lens, or IOL). Newly developed IOL technology allows the implanted lens to improve vision both at a distance and up close. Discuss with your eye surgeon about the possibility of still needing glasses. Also discuss how visual coordination between both eyes will be affected.  A cataract needs to be removed only  when vision loss interferes with your everyday activities such as driving, reading or watching TV. You and your eye doctor can make that decision together. In most cases, waiting until you are ready to have cataract surgery will not harm your eye. If you have cataracts in both eyes, only one should be removed at a time. This allows the operated eye to heal and be out of danger from serious problems (such as infection or poor wound healing) before having the other eye undergo surgery.  Sometimes, a cataract should be removed even if it does not cause problems with your vision. For example, a cataract should be removed if it prevents examination or treatment of another eye problem. Just as you cannot see out of the affected eye well, your doctor cannot see into your eye well through a cataract. The vast majority of people who have cataract surgery have better vision afterward. CATARACT REMOVAL There are two primary ways to remove a cataract. Your doctor can explain the differences and help determine which is best for you:  Phacoemulsification (small incision cataract surgery). This involves making a small cut (incision) on the edge of the clear, dome-shaped surface that covers the front of the eye (the cornea). An injection behind the eye or eye drops are given to make this a painless procedure. The doctor then inserts a tiny probe into the eye. This device emits ultrasound waves that soften and break up the cloudy center of the lens so it can be removed by suction. Most cataract surgery is done this way. The cuts are usually so small and performed in such a manner that often no sutures are needed to keep it closed.   Extracapsular surgery. Your doctor makes a slightly longer incision on the side of the cornea. The doctor removes the hard center of the lens. The remainder of the lens is then removed by suction. In some cases, extremely fine sutures are needed which the doctor may, or may not remove in the  office after the surgery.  When an IOL is implanted, it needs no care. It becomes a permanent part of your eye and cannot be seen or felt.  Some people cannot have an IOL. They may have problems during surgery, or maybe they have another eye disease. For these people, a soft contact lens may be suggested. If an IOL or contact lens cannot be used, very powerful and thick glasses are required after surgery. Since vision is very different through such thick glasses, it is important to have your doctor discuss the impact on your vision after any cataract surgery where there is no plan to implant an IOL. The normal lens of the eye is covered by a clear capsule. Both phacoemulsification and extracapsular surgery require that the back surface of this lens capsule be left in place. This helps support IOLs and prevents the IOL from dislocating and falling back into the deeper interior of the eye.  Right after surgery, and often permanently this "posterior capsule" remains clear. In some cases however, it can become cloudy, presenting the same type of visual compromise that the original cataract did since light is again obstructed as it passes through the clear IOL. This condition is often referred to as an "after-cataract." Fortunately, after-cataracts are easily treated using a painless and very fast laser treatment that is performed without anesthesia or incisions. It is done in a matter of minutes in an outpatient environment. Visual improvement is often immediate.  HOME CARE INSTRUCTIONS   Your surgeon will discuss pre and post operative care with you prior to surgery. The majority of people are able to do almost all normal activities right away. Although, it is often advised to avoid strenuous activity for a period of time.   Postoperative drops and careful avoidance of infection will be needed. Many surgeons suggest the use of a protective shield during the first few days after surgery.   There is a very small  incidence of complication from modern cataract surgery, but it can happen. Infection that spreads to the inside of the eye (endophthalmitis) can result in total visual loss and even loss of the eye itself. In extremely rare instances, the inflammation of endophthalmitis can spread to both eyes (sympathetic ophthalmia). Appropriate post-operative care under the close observation of your surgeon is essential to a successful outcome.  SEEK IMMEDIATE MEDICAL CARE IF:   You have any sudden drop of vision in the operated eye.   You have pain in the operated eye.   You see a large number of floating dots in the field of vision in the operated eye.   You see flashing lights, or if a portion of your side vision in any direction appears black (like a curtain being drawn into your field of vision) in the operated eye.  Document Released: 05/28/2005 Document Revised: 02/07/2011 Document Reviewed: 07/14/2007 [facility] Patient Information 2012 [place], [place].

## 2011-07-02 ENCOUNTER — Ambulatory Visit (HOSPITAL_COMMUNITY): Payer: Medicare Other | Admitting: Anesthesiology

## 2011-07-02 ENCOUNTER — Encounter (HOSPITAL_COMMUNITY)
Admission: RE | Disposition: A | Discharge status: Home / Self Care | Payer: Self-pay | Source: Ambulatory Visit | Attending: Ophthalmology

## 2011-07-02 ENCOUNTER — Ambulatory Visit (HOSPITAL_COMMUNITY)
Admission: RE | Admit: 2011-07-02 | Discharge: 2011-07-02 | Disposition: A | Discharge status: Home / Self Care | Payer: Medicare Other | Source: Ambulatory Visit | Attending: Ophthalmology | Admitting: Ophthalmology

## 2011-07-02 ENCOUNTER — Encounter (HOSPITAL_COMMUNITY): Payer: Self-pay | Admitting: Anesthesiology

## 2011-07-02 ENCOUNTER — Encounter (HOSPITAL_COMMUNITY): Payer: Self-pay | Admitting: *Deleted

## 2011-07-02 DIAGNOSIS — H538 Other visual disturbances: Secondary | ICD-10-CM | POA: Diagnosis not present

## 2011-07-02 DIAGNOSIS — Z01812 Encounter for preprocedural laboratory examination: Secondary | ICD-10-CM | POA: Diagnosis not present

## 2011-07-02 DIAGNOSIS — H269 Unspecified cataract: Secondary | ICD-10-CM | POA: Diagnosis not present

## 2011-07-02 DIAGNOSIS — I10 Essential (primary) hypertension: Secondary | ICD-10-CM | POA: Diagnosis not present

## 2011-07-02 DIAGNOSIS — H2510 Age-related nuclear cataract, unspecified eye: Secondary | ICD-10-CM | POA: Insufficient documentation

## 2011-07-02 DIAGNOSIS — Z79899 Other long term (current) drug therapy: Secondary | ICD-10-CM | POA: Diagnosis not present

## 2011-07-02 HISTORY — PX: CATARACT EXTRACTION W/PHACO: SHX586

## 2011-07-02 SURGERY — PHACOEMULSIFICATION, CATARACT, WITH IOL INSERTION
Anesthesia: Monitor Anesthesia Care | Site: Eye | Laterality: Left | Wound class: Clean

## 2011-07-02 MED ORDER — LIDOCAINE 3.5 % OP GEL OPTIME - NO CHARGE
OPHTHALMIC | Status: DC | PRN
  Administered 2011-07-02: 1 [drp] via OPHTHALMIC

## 2011-07-02 MED ORDER — EPINEPHRINE HCL 1 MG/ML IJ SOLN
INTRAMUSCULAR | Status: AC
  Filled 2011-07-02: qty 1

## 2011-07-02 MED ORDER — MIDAZOLAM HCL 2 MG/2ML IJ SOLN
1.0000 mg | INTRAMUSCULAR | Status: DC | PRN
  Administered 2011-07-02: 2 mg via INTRAVENOUS

## 2011-07-02 MED ORDER — LACTATED RINGERS IV SOLN
INTRAVENOUS | Status: DC
  Administered 2011-07-02: 1000 mL via INTRAVENOUS

## 2011-07-02 MED ORDER — EPINEPHRINE HCL 1 MG/ML IJ SOLN
INTRAOCULAR | Status: DC | PRN
  Administered 2011-07-02: 11:00:00

## 2011-07-02 MED ORDER — CYCLOPENTOLATE-PHENYLEPHRINE 0.2-1 % OP SOLN
1.0000 [drp] | Freq: Once | OPHTHALMIC | Status: AC
  Administered 2011-07-02: 1 [drp] via OPHTHALMIC

## 2011-07-02 MED ORDER — LIDOCAINE HCL 3.5 % OP GEL: 1.0000 "application " | Freq: Once | OPHTHALMIC | Status: DC

## 2011-07-02 MED ORDER — BSS IO SOLN
INTRAOCULAR | Status: DC | PRN
  Administered 2011-07-02: 15 mL via INTRAOCULAR

## 2011-07-02 MED ORDER — LIDOCAINE HCL 3.5 % OP GEL
OPHTHALMIC | Status: AC
  Filled 2011-07-02: qty 5

## 2011-07-02 MED ORDER — TETRACAINE HCL 0.5 % OP SOLN
OPHTHALMIC | Status: AC
  Administered 2011-07-02: 1 [drp] via OPHTHALMIC
  Filled 2011-07-02: qty 2

## 2011-07-02 MED ORDER — TETRACAINE 0.5 % OP SOLN OPTIME - NO CHARGE
OPHTHALMIC | Status: DC | PRN
  Administered 2011-07-02: 2 [drp] via OPHTHALMIC

## 2011-07-02 MED ORDER — GATIFLOXACIN 0.5 % OP SOLN
1.0000 [drp] | Freq: Once | OPHTHALMIC | Status: AC
  Administered 2011-07-02: 1 [drp] via OPHTHALMIC

## 2011-07-02 MED ORDER — NA HYALUR & NA CHOND-NA HYALUR 0.55-0.5 ML IO KIT
PACK | INTRAOCULAR | Status: DC | PRN
  Administered 2011-07-02: 1 via OPHTHALMIC

## 2011-07-02 MED ORDER — GATIFLOXACIN 0.5 % OP SOLN OPTIME - NO CHARGE
OPHTHALMIC | Status: DC | PRN
  Administered 2011-07-02: 1 [drp] via OPHTHALMIC

## 2011-07-02 MED ORDER — TETRACAINE HCL 0.5 % OP SOLN
1.0000 [drp] | Freq: Once | OPHTHALMIC | Status: AC
  Administered 2011-07-02: 1 [drp] via OPHTHALMIC

## 2011-07-02 MED ORDER — KETOROLAC TROMETHAMINE 0.5 % OP SOLN
1.0000 [drp] | Freq: Once | OPHTHALMIC | Status: AC
  Administered 2011-07-02: 1 [drp] via OPHTHALMIC

## 2011-07-02 MED ORDER — MIDAZOLAM HCL 2 MG/2ML IJ SOLN
INTRAMUSCULAR | Status: AC
  Administered 2011-07-02: 2 mg via INTRAVENOUS
  Filled 2011-07-02: qty 2

## 2011-07-02 SURGICAL SUPPLY — 27 items
CAPSULAR TENSION RING-AMO (OPHTHALMIC RELATED) IMPLANT
CLOTH BEACON ORANGE TIMEOUT ST (SAFETY) ×1 IMPLANT
GLOVE BIO SURGEON STRL SZ7.5 (GLOVE) IMPLANT
GLOVE BIOGEL M 6.5 STRL (GLOVE) IMPLANT
GLOVE BIOGEL PI IND STRL 6.5 (GLOVE) IMPLANT
GLOVE BIOGEL PI IND STRL 7.0 (GLOVE) IMPLANT
GLOVE BIOGEL PI INDICATOR 6.5 (GLOVE) ×1
GLOVE BIOGEL PI INDICATOR 7.0 (GLOVE)
GLOVE ECLIPSE 6.5 STRL STRAW (GLOVE) IMPLANT
GLOVE ECLIPSE 7.5 STRL STRAW (GLOVE) IMPLANT
GLOVE EXAM NITRILE LRG STRL (GLOVE) IMPLANT
GLOVE EXAM NITRILE MD LF STRL (GLOVE) ×1 IMPLANT
GLOVE SKINSENSE NS SZ6.5 (GLOVE)
GLOVE SKINSENSE NS SZ7.0 (GLOVE)
GLOVE SKINSENSE STRL SZ6.5 (GLOVE) IMPLANT
GLOVE SKINSENSE STRL SZ7.0 (GLOVE) IMPLANT
INST SET CATARACT ~~LOC~~ (KITS) ×2 IMPLANT
KIT VITRECTOMY (OPHTHALMIC RELATED) IMPLANT
LENS IOL ACRYSOF IQ TORIC 15.0 ×1 IMPLANT
PAD ARMBOARD 7.5X6 YLW CONV (MISCELLANEOUS) ×1 IMPLANT
PROC W NO LENS (INTRAOCULAR LENS)
PROC W SPEC LENS (INTRAOCULAR LENS) ×2
PROCESS W NO LENS (INTRAOCULAR LENS) IMPLANT
PROCESS W SPEC LENS (INTRAOCULAR LENS) IMPLANT
RING MALYGIN (MISCELLANEOUS) IMPLANT
VISCOELASTIC ADDITIONAL (OPHTHALMIC RELATED) IMPLANT
WATER STERILE IRR 250ML POUR (IV SOLUTION) ×1 IMPLANT

## 2011-07-02 NOTE — H&P (Signed)
Pt was interviewed and examined without changes.

## 2011-07-02 NOTE — Transfer of Care (Signed)
Immediate Anesthesia Transfer of Care Note  Patient: Angelica [name]  Procedure(s) Performed:  CATARACT EXTRACTION PHACO AND INTRAOCULAR LENS PLACEMENT (IOC) - CDE:3.45  Patient Location: Shortstay  Anesthesia Type: MAC  Level of Consciousness: awake  Airway & Oxygen Therapy: Patient Spontanous Breathing   Post-op Assessment: Report given to PACU RN, Post -op Vital signs reviewed and stable and Patient moving all extremities  Post vital signs: Reviewed and stable  Complications: No apparent anesthesia complications

## 2011-07-02 NOTE — Brief Op Note (Signed)
Error in requested lens correct lens: 20.0 diopter Alson [id number]  Exp 04/2016 CDE:3.45

## 2011-07-02 NOTE — Anesthesia Postprocedure Evaluation (Signed)
  Anesthesia Post-op Note  Patient: Angelica [name]  Procedure(s) Performed:  CATARACT EXTRACTION PHACO AND INTRAOCULAR LENS PLACEMENT (IOC) - CDE:3.45  Patient Location:  Short Stay  Anesthesia Type: MAC  Level of Consciousness: awake  Airway and Oxygen Therapy: Patient Spontanous Breathing  Post-op Pain: none  Post-op Assessment: Post-op Vital signs reviewed, Patient's Cardiovascular Status Stable, Respiratory Function Stable, Patent Airway, No signs of Nausea or vomiting and Pain level controlled  Post-op Vital Signs: Reviewed and stable  Complications: No apparent anesthesia complications

## 2011-07-02 NOTE — Op Note (Signed)
See scanned note.

## 2011-07-02 NOTE — Anesthesia Preprocedure Evaluation (Addendum)
Anesthesia Evaluation  Patient identified by MRN, date of birth, ID band Patient awake    Reviewed: Allergy & Precautions, H&P , NPO status , Patient's Chart, lab work & pertinent test results, reviewed documented beta blocker date and time   History of Anesthesia Complications Negative for: history of anesthetic complications  Airway Mallampati: III      Dental  (+) Teeth Intact   Pulmonary neg pulmonary ROS,  clear to auscultation        Cardiovascular hypertension, Pt. on medications + CAD (no CP now, no syncope) and + Past MI Regular Normal    Neuro/Psych    GI/Hepatic   Endo/Other    Renal/GU      Musculoskeletal   Abdominal   Peds  Hematology   Anesthesia Other Findings   Reproductive/Obstetrics                           Anesthesia Physical Anesthesia Plan  ASA: III  Anesthesia Plan: MAC   Post-op Pain Management:    Induction: Intravenous  Airway Management Planned: Nasal Cannula  Additional Equipment:   Intra-op Plan:   Post-operative Plan:   Informed Consent: I have reviewed the patients History and Physical, chart, labs and discussed the procedure including the risks, benefits and alternatives for the proposed anesthesia with the patient or authorized representative who has indicated his/her understanding and acceptance.     Plan Discussed with:   Anesthesia Plan Comments:         Anesthesia Quick Evaluation

## 2011-07-02 NOTE — Anesthesia Procedure Notes (Signed)
Procedure Name: MAC Date/Time: 07/02/2011 11:25 AM Performed by: Minerva Areola Pre-anesthesia Checklist: Patient identified, Patient being monitored, Timeout performed, Suction available and Emergency Drugs available Patient Re-evaluated:Patient Re-evaluated prior to inductionOxygen Delivery Method: Nasal Cannula

## 2011-07-02 NOTE — Brief Op Note (Signed)
07/02/2011  1:57 PM  PATIENT:  Terrilee Croak Gabrys  78 y.o. female  PRE-OPERATIVE DIAGNOSIS:  nuclear cataract left eye  POST-OPERATIVE DIAGNOSIS:  nuclear cataract left eye  PROCEDURE:  Procedure(s): CATARACT EXTRACTION PHACO AND INTRAOCULAR LENS PLACEMENT (IOC)  SURGEON:  Surgeon(s): Susa Simmonds, MD  ASSISTANTS: Trenton Founds, CST   ANESTHESIA STAFF: Minerva Areola, CRNA - CRNA Laurene Footman, MD - Anesthesiologist  ANESTHESIA:   topical/MAC  REQUESTED LENS POWER: 21.0  LENS IMPLANT INFORMATION: Alcon [id number] s/n [id number] exp 12/16  CUMULATIVE DISSIPATED ENERGY:3.61  INDICATIONS:see H&P  OP FINDINGS:dense NS  COMPLICATIONS:None  DICTATION #: see scanned note  PLAN OF CARE: see H&P  PATIENT DISPOSITION:  Short Stay

## 2011-07-05 ENCOUNTER — Encounter (HOSPITAL_COMMUNITY): Payer: Self-pay | Admitting: Ophthalmology

## 2011-10-04 DIAGNOSIS — Z9010 Acquired absence of unspecified breast and nipple: Secondary | ICD-10-CM | POA: Diagnosis not present

## 2011-10-04 DIAGNOSIS — Z1231 Encounter for screening mammogram for malignant neoplasm of breast: Secondary | ICD-10-CM | POA: Diagnosis not present

## 2011-10-22 ENCOUNTER — Encounter (HOSPITAL_COMMUNITY): Payer: Self-pay | Admitting: *Deleted

## 2011-10-22 ENCOUNTER — Emergency Department (HOSPITAL_COMMUNITY): Payer: Medicare Other

## 2011-10-22 ENCOUNTER — Other Ambulatory Visit: Payer: Self-pay

## 2011-10-22 ENCOUNTER — Observation Stay (HOSPITAL_COMMUNITY)
Admission: EM | Admit: 2011-10-22 | Discharge: 2011-10-23 | Disposition: A | Discharge status: Home / Self Care | Payer: Medicare Other | Attending: Internal Medicine | Admitting: Internal Medicine

## 2011-10-22 DIAGNOSIS — R079 Chest pain, unspecified: Secondary | ICD-10-CM

## 2011-10-22 DIAGNOSIS — E785 Hyperlipidemia, unspecified: Secondary | ICD-10-CM | POA: Diagnosis not present

## 2011-10-22 DIAGNOSIS — I119 Hypertensive heart disease without heart failure: Secondary | ICD-10-CM | POA: Diagnosis present

## 2011-10-22 DIAGNOSIS — I10 Essential (primary) hypertension: Secondary | ICD-10-CM

## 2011-10-22 DIAGNOSIS — Z860100 Personal history of colon polyps, unspecified: Secondary | ICD-10-CM

## 2011-10-22 DIAGNOSIS — R0602 Shortness of breath: Secondary | ICD-10-CM | POA: Insufficient documentation

## 2011-10-22 DIAGNOSIS — E876 Hypokalemia: Secondary | ICD-10-CM

## 2011-10-22 DIAGNOSIS — R1032 Left lower quadrant pain: Secondary | ICD-10-CM

## 2011-10-22 DIAGNOSIS — Z79899 Other long term (current) drug therapy: Secondary | ICD-10-CM | POA: Insufficient documentation

## 2011-10-22 DIAGNOSIS — K625 Hemorrhage of anus and rectum: Secondary | ICD-10-CM

## 2011-10-22 DIAGNOSIS — E039 Hypothyroidism, unspecified: Secondary | ICD-10-CM

## 2011-10-22 DIAGNOSIS — R197 Diarrhea, unspecified: Secondary | ICD-10-CM

## 2011-10-22 DIAGNOSIS — C50919 Malignant neoplasm of unspecified site of unspecified female breast: Secondary | ICD-10-CM | POA: Diagnosis not present

## 2011-10-22 DIAGNOSIS — E032 Hypothyroidism due to medicaments and other exogenous substances: Secondary | ICD-10-CM

## 2011-10-22 DIAGNOSIS — Z853 Personal history of malignant neoplasm of breast: Secondary | ICD-10-CM | POA: Diagnosis not present

## 2011-10-22 DIAGNOSIS — K5730 Diverticulosis of large intestine without perforation or abscess without bleeding: Secondary | ICD-10-CM

## 2011-10-22 DIAGNOSIS — Z6832 Body mass index (BMI) 32.0-32.9, adult: Secondary | ICD-10-CM | POA: Diagnosis not present

## 2011-10-22 DIAGNOSIS — K5732 Diverticulitis of large intestine without perforation or abscess without bleeding: Secondary | ICD-10-CM

## 2011-10-22 LAB — BASIC METABOLIC PANEL
BUN: 19 mg/dL (ref 6–23)
CO2: 33 mEq/L — ABNORMAL HIGH (ref 19–32)
Chloride: 95 mEq/L — ABNORMAL LOW (ref 96–112)
Creatinine, Ser: 0.78 mg/dL (ref 0.50–1.10)
Glucose, Bld: 101 mg/dL — ABNORMAL HIGH (ref 70–99)

## 2011-10-22 LAB — CBC
HCT: 40.7 % (ref 36.0–46.0)
Hemoglobin: 13.2 g/dL (ref 12.0–15.0)
MCV: 97.1 fL (ref 78.0–100.0)
WBC: 7.8 10*3/uL (ref 4.0–10.5)

## 2011-10-22 LAB — HEPATIC FUNCTION PANEL
ALT: 14 U/L (ref 0–35)
AST: 20 U/L (ref 0–37)
Albumin: 4.3 g/dL (ref 3.5–5.2)
Alkaline Phosphatase: 54 U/L (ref 39–117)
Total Protein: 8.2 g/dL (ref 6.0–8.3)

## 2011-10-22 LAB — DIFFERENTIAL
Eosinophils Relative: 2 % (ref 0–5)
Lymphocytes Relative: 33 % (ref 12–46)
Lymphs Abs: 2.5 10*3/uL (ref 0.7–4.0)
Monocytes Absolute: 0.4 10*3/uL (ref 0.1–1.0)
Monocytes Relative: 5 % (ref 3–12)
Neutro Abs: 4.7 10*3/uL (ref 1.7–7.7)

## 2011-10-22 LAB — CARDIAC PANEL(CRET KIN+CKTOT+MB+TROPI)
CK, MB: 2.5 ng/mL (ref 0.3–4.0)
Relative Index: 1.3 (ref 0.0–2.5)
Troponin I: <0.3 ng/mL (ref <0.30)

## 2011-10-22 LAB — POCT I-STAT TROPONIN I: Troponin i, poc: 0.01 ng/mL (ref 0.00–0.08)

## 2011-10-22 MED ORDER — ACETAMINOPHEN 325 MG PO TABS
650.0000 mg | ORAL_TABLET | Freq: Four times a day (QID) | ORAL | Status: DC | PRN
  Administered 2011-10-23: 650 mg via ORAL
  Filled 2011-10-22: qty 2

## 2011-10-22 MED ORDER — SODIUM CHLORIDE 0.9 % IJ SOLN
3.0000 mL | Freq: Two times a day (BID) | INTRAMUSCULAR | Status: DC
  Administered 2011-10-22 – 2011-10-23 (×2): 3 mL via INTRAVENOUS
  Filled 2011-10-22 (×2): qty 3

## 2011-10-22 MED ORDER — HYDROCHLOROTHIAZIDE 25 MG PO TABS
25.0000 mg | ORAL_TABLET | Freq: Every day | ORAL | Status: DC
  Administered 2011-10-23: 25 mg via ORAL
  Filled 2011-10-22: qty 1

## 2011-10-22 MED ORDER — ENALAPRIL MALEATE 5 MG PO TABS
10.0000 mg | ORAL_TABLET | Freq: Every day | ORAL | Status: DC
  Administered 2011-10-23: 10 mg via ORAL
  Filled 2011-10-22: qty 2

## 2011-10-22 MED ORDER — MORPHINE SULFATE 2 MG/ML IJ SOLN: 1.0000 mg | INTRAMUSCULAR | Status: DC | PRN

## 2011-10-22 MED ORDER — ASPIRIN 81 MG PO CHEW
81.0000 mg | CHEWABLE_TABLET | Freq: Every day | ORAL | Status: DC
  Administered 2011-10-23: 81 mg via ORAL
  Filled 2011-10-22: qty 1

## 2011-10-22 MED ORDER — ALLOPURINOL 300 MG PO TABS
300.0000 mg | ORAL_TABLET | Freq: Every day | ORAL | Status: DC
  Administered 2011-10-23: 300 mg via ORAL
  Filled 2011-10-22: qty 1

## 2011-10-22 MED ORDER — METOPROLOL SUCCINATE ER 50 MG PO TB24
50.0000 mg | ORAL_TABLET | Freq: Every day | ORAL | Status: DC
  Administered 2011-10-23: 50 mg via ORAL
  Filled 2011-10-22: qty 1

## 2011-10-22 MED ORDER — LEVOTHYROXINE SODIUM 50 MCG PO TABS
50.0000 ug | ORAL_TABLET | Freq: Every day | ORAL | Status: DC
  Administered 2011-10-23: 50 ug via ORAL
  Filled 2011-10-22: qty 1

## 2011-10-22 MED ORDER — ACETAMINOPHEN 650 MG RE SUPP: 650.0000 mg | Freq: Four times a day (QID) | RECTAL | Status: DC | PRN

## 2011-10-22 MED ORDER — ENALAPRIL-HYDROCHLOROTHIAZIDE 10-25 MG PO TABS: 1.0000 | ORAL_TABLET | Freq: Every day | ORAL | Status: DC

## 2011-10-22 MED ORDER — ALUM & MAG HYDROXIDE-SIMETH 200-200-20 MG/5ML PO SUSP: 30.0000 mL | Freq: Four times a day (QID) | ORAL | Status: DC | PRN

## 2011-10-22 MED ORDER — ENOXAPARIN SODIUM 40 MG/0.4ML ~~LOC~~ SOLN
22.0000 mg | SUBCUTANEOUS | Status: DC
  Administered 2011-10-22: 20 mg via SUBCUTANEOUS
  Filled 2011-10-22: qty 0.4

## 2011-10-22 MED ORDER — ATORVASTATIN CALCIUM 10 MG PO TABS
10.0000 mg | ORAL_TABLET | Freq: Every day | ORAL | Status: DC
  Administered 2011-10-22: 10 mg via ORAL
  Filled 2011-10-22: qty 1

## 2011-10-22 MED ORDER — AMLODIPINE BESYLATE 5 MG PO TABS
2.5000 mg | ORAL_TABLET | Freq: Every day | ORAL | Status: DC
  Administered 2011-10-22 – 2011-10-23 (×2): 2.5 mg via ORAL
  Filled 2011-10-22 (×2): qty 1

## 2011-10-22 NOTE — ED Notes (Signed)
Patient ambulatory to restroom, family member with patient. No other needs voiced at this time.

## 2011-10-22 NOTE — ED Provider Notes (Signed)
History  This chart was scribed for Geoffery Lyons, MD by Cherlynn Perches. The patient was seen in room APA04/APA04. Patient's care was started at 1253.  CSN: [id number]  Arrival date & time 10/22/11  1253   First MD Initiated Contact with Patient 10/22/11 1501      Chief Complaint  Patient presents with  . Chest Pain    (Consider location/radiation/quality/duration/timing/severity/associated sxs/prior treatment) The history is provided by the patient. No language interpreter was used.    Angelica [name] is a 78 y.o. female with a h/o HTN, hyperlipidemia, and MI who presents to the Emergency Department complaining of 2-3 weeks of waxing and waning, moderate chest pain localized to the left side of the chest and radiating to the left upper back with associated shortness of breath. Pt states that pain is usually mild during the day, but gets worse at night. Pt reports that pain is sometimes so severe that it wakes her up. Pt visited Dr. Sherwood Gambler today. He performed an EKG and pt was referred to the ED for an evaluation of an abnormal result. Pt denies nausea, diaphoresis, fever, and cough. Pt denies smoking and alcohol use.   Past Medical History  Diagnosis Date  . HTN (hypertension)   . Hypothyroid   . Hyperlipidemia   . Anemia   . Gout   . Colon polyps     Previous colonoscopy by Dr. Elpidio Anis  . Breast cancer 2011    left; no adjuvent therapy, Dr. Cleone Slim  . MI (myocardial infarction)   . Arthritis   . Chronic bronchitis   . Diverticulosis of colon   . Hemorrhoids     Past Surgical History  Procedure Date  . Abdominal hysterectomy   . Cholecystectomy   . Hemorrhoid surgery   . Colonoscopy      ?3 years ago. Per pt, diverticulosis, hemorrhoids  . Colonoscopy 04/06/2011    Procedure: COLONOSCOPY;  Surgeon: Arlyce Harman, MD;  Location: [facility] SUITE;  Service: Endoscopy;  Laterality: N/A;  9:30  . Mastectomy 2011    left  . Cataract extraction w/phaco 07/02/2011   Procedure: CATARACT EXTRACTION PHACO AND INTRAOCULAR LENS PLACEMENT (IOC);  Surgeon: Susa Simmonds, MD;  Location: [facility] ORS;  Service: Ophthalmology;  Laterality: Left;  CDE:3.45    Family History  Problem Relation Age of Onset  . Prostate cancer Father   . Breast cancer Sister   . Colon cancer Neg Hx   . Anesthesia problems Neg Hx   . Hypotension Neg Hx   . Malignant hyperthermia Neg Hx   . Pseudochol deficiency Neg Hx     History  Substance Use Topics  . Smoking status: Never Smoker   . Smokeless tobacco: Not on file  . Alcohol Use: No    OB History    Grav Para Term Preterm Abortions TAB SAB Ect Mult Living                  Review of Systems  Constitutional: Negative for fever, chills and diaphoresis.  HENT: Negative for ear pain, sore throat and neck pain.   Respiratory: Positive for shortness of breath. Negative for cough.   Cardiovascular: Positive for chest pain. Negative for leg swelling.  Gastrointestinal: Negative for nausea, vomiting and diarrhea.  All other systems reviewed and are negative.    Allergies  Ciprofloxacin; Clindamycin hcl; Flagyl; Shellfish-derived products; and Sulfa antibiotics  Home Medications   Current Outpatient Rx  Name Route Sig Dispense Refill  .  ALLOPURINOL 300 MG PO TABS Oral Take 300 mg by mouth daily.      Marland Kitchen AMLODIPINE BESYLATE 2.5 MG PO TABS Oral Take 2.5 mg by mouth daily.      . ASPIRIN 81 MG PO TABS Oral Take 81 mg by mouth daily.      . ATORVASTATIN CALCIUM 10 MG PO TABS Oral Take 10 mg by mouth at bedtime.     . ENALAPRIL-HYDROCHLOROTHIAZIDE 10-25 MG PO TABS Oral Take 1 tablet by mouth daily.      . FUROSEMIDE 20 MG PO TABS Oral Take 20 mg by mouth daily.     Marland Kitchen LEVOTHYROXINE SODIUM 50 MCG PO TABS Oral Take 50 mcg by mouth daily.     Marland Kitchen METOPROLOL TARTRATE 50 MG PO TABS Oral Take 50 mg by mouth daily.     Carma Leaven M PLUS PO TABS Oral Take 1 tablet by mouth daily.        Triage Vitals: BP 161/82  Pulse 75  Temp(Src)  97.5 F (36.4 C) (Oral)  Resp 18  Ht 5' (1.524 m)  Wt 173 lb (78.472 kg)  BMI 33.79 kg/m2  SpO2 98%  Physical Exam  Nursing note and vitals reviewed. Constitutional: She is oriented to person, place, and time. She appears well-developed and well-nourished.  HENT:  Head: Normocephalic and atraumatic.  Eyes: Conjunctivae and EOM are normal. Pupils are equal, round, and reactive to light. No scleral icterus.  Neck: Normal range of motion. Neck supple.  Cardiovascular: Normal rate, regular rhythm and normal heart sounds.   No murmur heard. Pulmonary/Chest: Effort normal and breath sounds normal. No respiratory distress. She has no wheezes.       Clear and equal bilaterally   Abdominal: Soft. She exhibits no distension. There is no tenderness.  Genitourinary:       No CVA tenderness  Musculoskeletal: Normal range of motion. She exhibits no edema.  Neurological: She is alert and oriented to person, place, and time. Coordination normal.  Skin: Skin is warm and dry. No rash noted. No erythema.  Psychiatric: She has a normal mood and affect. Her behavior is normal.    ED Course  Procedures (including critical care time)  DIAGNOSTIC STUDIES: Oxygen Saturation is 98% on room air, normal by my interpretation.    COORDINATION OF CARE: 3:12 PM - Patient and Family understand and agree with initial ED impression and plan with expectations set for ED visit.      Labs Reviewed  CBC  DIFFERENTIAL  BASIC METABOLIC PANEL   No results found.   No diagnosis found.   Date: 10/22/2011  Rate: 71  Rhythm: normal sinus rhythm  QRS Axis: normal  Intervals: normal  ST/T Wave abnormalities: nonspecific T wave changes  Conduction Disutrbances:none  Narrative Interpretation:   Old EKG Reviewed: changes noted    MDM  The patient was sent here from Dr. Sharyon Medicus office for evaluation of chest discomfort and ekg changes. The symptoms sound somewhat atypical, however there are T wave  inversions in V2-V4 that are new when compared with her prior ekg.  I will consult internal medicine for admission.     I personally performed the services described in this documentation, which was scribed in my presence. The recorded information has been reviewed and considered.     Geoffery Lyons, MD 10/22/11 [phone]

## 2011-10-22 NOTE — H&P (Addendum)
Hospital Admission Note Date: 10/22/2011  Patient name: Angelica [name] Medical record number: [id number] Date of birth: 01-08-1933 Age: 78 y.o. Gender: female PCP: Cassell Smiles., MD, MD  Attending physician: Christiane Ha, MD  Chief Complaint: chest pain  History of Present Illness:  Angelica [name] is an 78 y.o. female who was sent to the emergency room by her primary care physician with chest pain. She has had intermittent substernal and left-sided chest pain on and off for about a month now. Sometimes associated with diaphoresis. Sometimes associated with dyspnea. It has woken her up from sleep. Sometimes exertional. Sometimes postprandial. She's had no vomiting. No cough. She also notes that her legs seem a little bit sore. She had similar symptoms and had a cardiac catheterization in 2006 which showed insignificant coronary artery disease and normal ejection fraction. She had had a false positive stress test do to breast attenuation. She has a history of breast cancer and is post left mastectomy. No previous history of thromboembolic disease. She takes an aspirin a day. She has been evaluated by [facility] vascular in the past. She has a history of hyperlipidemia and hypertension. Her pain at its worse has been 5 or 6. She had about a 1/10 episode of pain today.  Past Medical History  Diagnosis Date  . HTN (hypertension)   . Hypothyroid   . Hyperlipidemia   . Anemia   . Gout   . Colon polyps     Previous colonoscopy by Dr. Elpidio Anis  . Breast cancer 2011    left; no adjuvent therapy, Dr. Cleone Slim  . MI (myocardial infarction)   . Arthritis   . Chronic bronchitis   . Diverticulosis of colon   . Hemorrhoids     Meds: Prescriptions prior to admission  Medication Sig Dispense Refill  . allopurinol (ZYLOPRIM) 300 MG tablet Take 300 mg by mouth daily.        Marland Kitchen amLODipine (NORVASC) 2.5 MG tablet Take 2.5 mg by mouth daily.        Marland Kitchen aspirin 81 MG tablet Take 81 mg by  mouth daily.        Marland Kitchen atorvastatin (LIPITOR) 10 MG tablet Take 10 mg by mouth at bedtime.       . enalapril-hydrochlorothiazide (VASERETIC) 10-25 MG per tablet Take 1 tablet by mouth daily.        . furosemide (LASIX) 20 MG tablet Take 20 mg by mouth daily.       Marland Kitchen levothyroxine (SYNTHROID, LEVOTHROID) 50 MCG tablet Take 50 mcg by mouth daily.       . metoprolol succinate (TOPROL-XL) 50 MG 24 hr tablet Take 50 mg by mouth daily. Take with or immediately following a meal.      . Multiple Vitamins-Minerals (MULTIVITAMINS THER. W/MINERALS) TABS Take 1 tablet by mouth daily.          Allergies: Bee venom; Ciprofloxacin; Clindamycin hcl; Flagyl; Shellfish-derived products; and Sulfa antibiotics History   Social History  . Marital Status: Married    Spouse Name: N/A    Number of Children: 2  . Years of Education: N/A   Occupational History  . retired    Social History Main Topics  . Smoking status: Never Smoker   . Smokeless tobacco: Not on file  . Alcohol Use: No  . Drug Use: No  . Sexually Active: Not on file   Other Topics Concern  . Not on file   Social History Narrative  . No  narrative on file   Family History  Problem Relation Age of Onset  . Prostate cancer Father   . Breast cancer Sister   . Colon cancer Neg Hx   . Anesthesia problems Neg Hx   . Hypotension Neg Hx   . Malignant hyperthermia Neg Hx   . Pseudochol deficiency Neg Hx    Past Surgical History  Procedure Date  . Abdominal hysterectomy   . Cholecystectomy   . Hemorrhoid surgery   . Colonoscopy      ?3 years ago. Per pt, diverticulosis, hemorrhoids  . Colonoscopy 04/06/2011    Procedure: COLONOSCOPY;  Surgeon: Arlyce Harman, MD;  Location: [facility] END[facility];  Service: Endoscopy;  Laterality: N/A;  9:30  . Mastectomy 2011    left  . Cataract extraction w/phaco 07/02/2011    Procedure: CATARACT EXTRACTION PHACO AND INTRAOCULAR LENS PLACEMENT (IOC);  Surgeon: Susa Simmonds, MD;  Location: [facility] ORS;   Service: Ophthalmology;  Laterality: Left;  CDE:3.45    Review of Systems: Systems reviewed and as per HPI, otherwise negative.  Physical Exam: Blood pressure 136/65, pulse 75, temperature 97.8 F (36.6 C), temperature source Oral, resp. rate 17, height 5' (1.524 m), weight 78.472 kg (173 lb), SpO2 99.00%. BP 136/65  Pulse 75  Temp(Src) 97.8 F (36.6 C) (Oral)  Resp 17  Ht 5' (1.524 m)  Wt 78.472 kg (173 lb)  BMI 33.79 kg/m2  SpO2 99%  General Appearance:    Alert, cooperative, no distress, appears stated age  Head:    Normocephalic, without obvious abnormality, atraumatic  Eyes:    PERRL, conjunctiva/corneas clear, EOM's intact, fundi    benign, both eyes  Ears:    Normal TM's and external ear canals, both ears  Nose:   Nares normal, septum midline, mucosa normal, no drainage    or sinus tenderness  Throat:   Lips, mucosa, and tongue normal; teeth and gums normal  Neck:   Supple, symmetrical, trachea midline, no adenopathy;    thyroid:  no enlargement/tenderness/nodules; no carotid   bruit or JVD  Back:     Symmetric, no curvature, ROM normal, no CVA tenderness  Lungs:     Clear to auscultation bilaterally, respirations unlabored  Chest Wall:    No tenderness or deformity   Heart:    Regular rate and rhythm, S1 and S2 normal, no murmur, rub   or gallop  Breast Exam:    left mastectomy noted   Abdomen:     Soft, non-tender, bowel sounds active all four quadrants,    no masses, no organomegaly  Genitalia:   deferred   Rectal:   deferred   Extremities:   Extremities normal, atraumatic, no cyanosis or edema  Pulses:   2+ and symmetric all extremities  Skin:   Skin color, texture, turgor normal, no rashes or lesions  Lymph nodes:   Cervical, supraclavicular, and axillary nodes normal  Neurologic:   CNII-XII intact, normal strength, sensation and reflexes    throughout    Psychiatric: Normal affect.  Lab results: Basic Metabolic Panel:  Basename 10/22/11 1525  NA 140  K  3.2*  CL 95*  CO2 33*  GLUCOSE 101*  BUN 19  CREATININE 0.78  CALCIUM 10.1  MG --  PHOS --   Liver Function Tests: No results found for this basename: AST:2,ALT:2,ALKPHOS:2,BILITOT:2,PROT:2,ALBUMIN:2 in the last 72 hours No results found for this basename: LIPASE:2,AMYLASE:2 in the last 72 hours No results found for this basename: AMMONIA:2 in the last 72  hours CBC:  Basename 10/22/11 1525  WBC 7.8  NEUTROABS 4.7  HGB 13.2  HCT 40.7  MCV 97.1  PLT 165   EKG showed normal sinus rhythm with flipped T waves and anteriorly its, slightly more pronounced than previous.  Imaging results:  Dg Chest 2 View  10/22/2011  *RADIOLOGY REPORT*  Clinical Data: Chest pain 2-3 weeks.  History breast cancer and hypertension.  CHEST - 2 VIEW  Comparison: 12/30/2006.  Findings: There are no infiltrates or edematous changes.  There is mild cardiomegaly.  There is no evidence for mediastinal or hilar adenopathy.  The osseous structures are unremarkable.  IMPRESSION: Mild cardiomegaly.  No acute findings.  Original Report Authenticated By: Rolla Plate, M.D.    Assessment & Plan: Principal Problem:  *Chest pain Active Problems:  Benign hypertension  Breast cancer  Hypokalemia  Hypothyroidism  Hyperlipidemia  Admit to telemetry. Observation status. Rule out MI. Check d-dimer and if positive consider CT angiogram of the chest. Continue aspirin and other outpatient medications. Replete potassium. Check echocardiogram for wall motion abnormalities or change in systolic function.  Meggan Dhaliwal L 10/22/2011, 5:56 PM

## 2011-10-22 NOTE — ED Notes (Signed)
Pt says she has chest pain at night.  Seen at Dr Ignacia Palma office  And had abn EKG. Sent here for eval.    No pain at present, feels sl sob

## 2011-10-23 DIAGNOSIS — E032 Hypothyroidism due to medicaments and other exogenous substances: Secondary | ICD-10-CM

## 2011-10-23 DIAGNOSIS — E785 Hyperlipidemia, unspecified: Secondary | ICD-10-CM | POA: Diagnosis not present

## 2011-10-23 DIAGNOSIS — E876 Hypokalemia: Secondary | ICD-10-CM

## 2011-10-23 DIAGNOSIS — I10 Essential (primary) hypertension: Secondary | ICD-10-CM

## 2011-10-23 DIAGNOSIS — R0602 Shortness of breath: Secondary | ICD-10-CM | POA: Diagnosis not present

## 2011-10-23 DIAGNOSIS — R079 Chest pain, unspecified: Secondary | ICD-10-CM

## 2011-10-23 LAB — CARDIAC PANEL(CRET KIN+CKTOT+MB+TROPI): CK, MB: 2.4 ng/mL (ref 0.3–4.0)

## 2011-10-23 MED ORDER — OMEPRAZOLE 20 MG PO CPDR: 20.0000 mg | DELAYED_RELEASE_CAPSULE | Freq: Every day | ORAL | Status: DC

## 2011-10-23 MED ORDER — KETOROLAC TROMETHAMINE 30 MG/ML IJ SOLN
30.0000 mg | Freq: Once | INTRAMUSCULAR | Status: AC
  Administered 2011-10-23: 30 mg via INTRAVENOUS
  Filled 2011-10-23: qty 1

## 2011-10-23 MED ORDER — ACETAMINOPHEN 325 MG PO TABS: 650.0000 mg | ORAL_TABLET | Freq: Four times a day (QID) | ORAL | Status: AC | PRN

## 2011-10-23 NOTE — Discharge Summary (Signed)
Physician Discharge Summary  Patient ID: Angelica [name] MRN: [id number] DOB/AGE: 1933-02-27 78 y.o.  Admit date: 10/22/2011 Discharge date: 10/23/2011  Discharge Diagnoses:  Principal Problem:  *Chest pain Active Problems:  Benign hypertension  Breast cancer  Hypokalemia  Hypothyroidism  Hyperlipidemia   Medication List  As of 10/23/2011  9:44 AM   TAKE these medications         acetaminophen 325 MG tablet   Commonly known as: TYLENOL   Take 2 tablets (650 mg total) by mouth every 6 (six) hours as needed (or Fever >/= 101).      allopurinol 300 MG tablet   Commonly known as: ZYLOPRIM   Take 300 mg by mouth daily.      amLODipine 2.5 MG tablet   Commonly known as: NORVASC   Take 2.5 mg by mouth daily.      aspirin 81 MG tablet   Take 81 mg by mouth daily.      atorvastatin 10 MG tablet   Commonly known as: LIPITOR   Take 10 mg by mouth at bedtime.      enalapril-hydrochlorothiazide 10-25 MG per tablet   Commonly known as: VASERETIC   Take 1 tablet by mouth daily.      furosemide 20 MG tablet   Commonly known as: LASIX   Take 20 mg by mouth daily.      levothyroxine 50 MCG tablet   Commonly known as: SYNTHROID, LEVOTHROID   Take 50 mcg by mouth daily.      metoprolol succinate 50 MG 24 hr tablet   Commonly known as: TOPROL-XL   Take 50 mg by mouth daily. Take with or immediately following a meal.      multivitamins ther. w/minerals Tabs   Take 1 tablet by mouth daily.      omeprazole 20 MG capsule   Commonly known as: PRILOSEC   Take 1 capsule (20 mg total) by mouth daily.            Discharge Orders    Future Orders Please Complete By Expires   Diet - low sodium heart healthy      Increase activity slowly         Follow-up Information    Follow up with [facility] in 1 week.         Disposition: 01-Home or Self Care  Discharged Condition: stable  Consults:  none  Labs:   Results for orders placed during the hospital  encounter of 10/22/11 (from the past 48 hour(s))  CBC     Status: Normal   Collection Time   10/22/11  3:25 PM      Component Value Range Comment   WBC 7.8  4.0 - 10.5 (K/uL)    RBC 4.19  3.87 - 5.11 (MIL/uL)    Hemoglobin 13.2  12.0 - 15.0 (g/dL)    HCT 40.9  81.1 - 91.4 (%)    MCV 97.1  78.0 - 100.0 (fL)    MCH 31.5  26.0 - 34.0 (pg)    MCHC 32.4  30.0 - 36.0 (g/dL)    RDW 78.2  95.6 - 21.3 (%)    Platelets 165  150 - 400 (K/uL)   DIFFERENTIAL     Status: Normal   Collection Time   10/22/11  3:25 PM      Component Value Range Comment   Neutrophils Relative 61  43 - 77 (%)    Neutro Abs 4.7  1.7 - 7.7 (K/uL)  Lymphocytes Relative 33  12 - 46 (%)    Lymphs Abs 2.5  0.7 - 4.0 (K/uL)    Monocytes Relative 5  3 - 12 (%)    Monocytes Absolute 0.4  0.1 - 1.0 (K/uL)    Eosinophils Relative 2  0 - 5 (%)    Eosinophils Absolute 0.1  0.0 - 0.7 (K/uL)    Basophils Relative 1  0 - 1 (%)    Basophils Absolute 0.1  0.0 - 0.1 (K/uL)   BASIC METABOLIC PANEL     Status: Abnormal   Collection Time   10/22/11  3:25 PM      Component Value Range Comment   Sodium 140  135 - 145 (mEq/L)    Potassium 3.2 (*) 3.5 - 5.1 (mEq/L)    Chloride 95 (*) 96 - 112 (mEq/L)    CO2 33 (*) 19 - 32 (mEq/L)    Glucose, Bld 101 (*) 70 - 99 (mg/dL)    BUN 19  6 - 23 (mg/dL)    Creatinine, Ser 1.61  0.50 - 1.10 (mg/dL)    Calcium 09.6  8.4 - 10.5 (mg/dL)    GFR calc non Af Amer 79 (*) >90 (mL/min)    GFR calc Af Amer >90  >90 (mL/min)   POCT I-STAT TROPONIN I     Status: Normal   Collection Time   10/22/11  4:01 PM      Component Value Range Comment   Troponin i, poc 0.01  0.00 - 0.08 (ng/mL)    Comment 3            D-DIMER, QUANTITATIVE     Status: Normal   Collection Time   10/22/11  5:15 PM      Component Value Range Comment   D-Dimer, Quant 0.35  0.00 - 0.48 (ug/mL-FEU)   HEPATIC FUNCTION PANEL     Status: Normal   Collection Time   10/22/11  5:15 PM      Component Value Range Comment   Total Protein  8.2  6.0 - 8.3 (g/dL)    Albumin 4.3  3.5 - 5.2 (g/dL)    AST 20  0 - 37 (U/L)    ALT 14  0 - 35 (U/L)    Alkaline Phosphatase 54  39 - 117 (U/L)    Total Bilirubin 1.1  0.3 - 1.2 (mg/dL)    Bilirubin, Direct 0.2  0.0 - 0.3 (mg/dL)    Indirect Bilirubin 0.9  0.3 - 0.9 (mg/dL)   CARDIAC PANEL(CRET KIN+CKTOT+MB+TROPI)     Status: Abnormal   Collection Time   10/22/11  6:33 PM      Component Value Range Comment   Total CK 195 (*) 7 - 177 (U/L)    CK, MB 2.5  0.3 - 4.0 (ng/mL)    Troponin I <0.30  <0.30 (ng/mL)    Relative Index 1.3  0.0 - 2.5    CARDIAC PANEL(CRET KIN+CKTOT+MB+TROPI)     Status: Abnormal   Collection Time   10/22/11 11:42 PM      Component Value Range Comment   Total CK 196 (*) 7 - 177 (U/L)    CK, MB 2.4  0.3 - 4.0 (ng/mL)    Troponin I <0.30  <0.30 (ng/mL)    Relative Index 1.2  0.0 - 2.5      Diagnostics:  Dg Chest 2 View  10/22/2011  *RADIOLOGY REPORT*  Clinical Data: Chest pain 2-3 weeks.  History breast cancer and hypertension.  CHEST -  2 VIEW  Comparison: 12/30/2006.  Findings: There are no infiltrates or edematous changes.  There is mild cardiomegaly.  There is no evidence for mediastinal or hilar adenopathy.  The osseous structures are unremarkable.  IMPRESSION: Mild cardiomegaly.  No acute findings.  Original Report Authenticated By: Rolla Plate, M.D.   Procedures: none  EKG: Normal sinus rhythm with flipped T waves anteriorly, more pronounced than previous  Full Code   Hospital Course: See H&P for complete admission details. The patient is a 78 year old black female with intermittent chest pain for about a month. Sometimes associated with eating, waking her from sleep, sometimes exertional, sometimes while playing the piano. She had no reproducible tenderness. She had had a cardiac catheterization in 2006 which showed insignificant coronary artery disease. Patient was admitted overnight to observation on telemetry. She has had no further chest pain. She  reports mainly some neck pain/spasm and leg pain. She has ruled out for MI. Her d-dimer is normal. Her pain may be musculoskeletal versus gastrointestinal. I will place her on Prilosec to see if this helps. She does not want to stay for any further workup. She can followup with [facility] who did the cardiac catheterization previously. I've asked the secretary to arrange a followup visit.  Discharge Exam:  Blood pressure 103/64, pulse 73, temperature 97.9 F (36.6 C), temperature source Oral, resp. rate 20, height 5' (1.524 m), weight 76.6 kg (168 lb 14 oz), SpO2 97.00%.  Unchanged from 10/22/11   Signed: Crista Curb L 10/23/2011, 9:44 AM

## 2011-10-23 NOTE — Progress Notes (Signed)
UR Chart Review Completed  

## 2011-10-29 DIAGNOSIS — E039 Hypothyroidism, unspecified: Secondary | ICD-10-CM | POA: Diagnosis not present

## 2011-10-29 DIAGNOSIS — R5381 Other malaise: Secondary | ICD-10-CM | POA: Diagnosis not present

## 2011-10-29 DIAGNOSIS — R5383 Other fatigue: Secondary | ICD-10-CM | POA: Diagnosis not present

## 2011-10-29 DIAGNOSIS — E782 Mixed hyperlipidemia: Secondary | ICD-10-CM | POA: Diagnosis not present

## 2011-10-29 DIAGNOSIS — R079 Chest pain, unspecified: Secondary | ICD-10-CM | POA: Diagnosis not present

## 2011-10-29 DIAGNOSIS — I10 Essential (primary) hypertension: Secondary | ICD-10-CM | POA: Diagnosis not present

## 2011-11-02 ENCOUNTER — Encounter: Payer: Medicare Other | Admitting: Hematology and Oncology

## 2011-11-02 DIAGNOSIS — E785 Hyperlipidemia, unspecified: Secondary | ICD-10-CM

## 2011-11-02 DIAGNOSIS — I10 Essential (primary) hypertension: Secondary | ICD-10-CM

## 2011-11-02 DIAGNOSIS — C50919 Malignant neoplasm of unspecified site of unspecified female breast: Secondary | ICD-10-CM

## 2011-11-02 DIAGNOSIS — E039 Hypothyroidism, unspecified: Secondary | ICD-10-CM

## 2011-11-10 HISTORY — PX: NM MYOVIEW LTD: HXRAD82

## 2011-11-21 DIAGNOSIS — R0602 Shortness of breath: Secondary | ICD-10-CM | POA: Diagnosis not present

## 2011-11-21 DIAGNOSIS — I2510 Atherosclerotic heart disease of native coronary artery without angina pectoris: Secondary | ICD-10-CM | POA: Diagnosis not present

## 2011-11-21 DIAGNOSIS — I10 Essential (primary) hypertension: Secondary | ICD-10-CM | POA: Diagnosis not present

## 2011-11-21 DIAGNOSIS — R079 Chest pain, unspecified: Secondary | ICD-10-CM | POA: Diagnosis not present

## 2011-11-23 DIAGNOSIS — R011 Cardiac murmur, unspecified: Secondary | ICD-10-CM | POA: Diagnosis not present

## 2011-11-26 DIAGNOSIS — I10 Essential (primary) hypertension: Secondary | ICD-10-CM | POA: Diagnosis not present

## 2011-11-26 DIAGNOSIS — Z79899 Other long term (current) drug therapy: Secondary | ICD-10-CM | POA: Diagnosis not present

## 2011-11-26 DIAGNOSIS — E878 Other disorders of electrolyte and fluid balance, not elsewhere classified: Secondary | ICD-10-CM | POA: Diagnosis not present

## 2011-11-26 DIAGNOSIS — I739 Peripheral vascular disease, unspecified: Secondary | ICD-10-CM | POA: Diagnosis not present

## 2011-11-26 DIAGNOSIS — E781 Pure hyperglyceridemia: Secondary | ICD-10-CM | POA: Diagnosis not present

## 2011-11-26 DIAGNOSIS — R079 Chest pain, unspecified: Secondary | ICD-10-CM | POA: Diagnosis not present

## 2011-11-27 DIAGNOSIS — Z853 Personal history of malignant neoplasm of breast: Secondary | ICD-10-CM | POA: Diagnosis not present

## 2011-11-27 DIAGNOSIS — Z9010 Acquired absence of unspecified breast and nipple: Secondary | ICD-10-CM | POA: Diagnosis not present

## 2011-11-27 DIAGNOSIS — N63 Unspecified lump in unspecified breast: Secondary | ICD-10-CM | POA: Diagnosis not present

## 2011-12-25 DIAGNOSIS — I70219 Atherosclerosis of native arteries of extremities with intermittent claudication, unspecified extremity: Secondary | ICD-10-CM | POA: Diagnosis not present

## 2012-02-04 DIAGNOSIS — T8529XA Other mechanical complication of intraocular lens, initial encounter: Secondary | ICD-10-CM | POA: Diagnosis not present

## 2012-02-04 DIAGNOSIS — Z961 Presence of intraocular lens: Secondary | ICD-10-CM | POA: Diagnosis not present

## 2012-02-04 DIAGNOSIS — H2589 Other age-related cataract: Secondary | ICD-10-CM | POA: Diagnosis not present

## 2012-02-04 DIAGNOSIS — H5200 Hypermetropia, unspecified eye: Secondary | ICD-10-CM | POA: Diagnosis not present

## 2012-02-21 DIAGNOSIS — L259 Unspecified contact dermatitis, unspecified cause: Secondary | ICD-10-CM | POA: Diagnosis not present

## 2012-02-21 DIAGNOSIS — R21 Rash and other nonspecific skin eruption: Secondary | ICD-10-CM | POA: Diagnosis not present

## 2012-03-12 DIAGNOSIS — Z23 Encounter for immunization: Secondary | ICD-10-CM | POA: Diagnosis not present

## 2012-03-27 DIAGNOSIS — Z961 Presence of intraocular lens: Secondary | ICD-10-CM | POA: Diagnosis not present

## 2012-03-27 DIAGNOSIS — T8529XA Other mechanical complication of intraocular lens, initial encounter: Secondary | ICD-10-CM | POA: Diagnosis not present

## 2012-03-27 DIAGNOSIS — H2589 Other age-related cataract: Secondary | ICD-10-CM | POA: Diagnosis not present

## 2012-04-03 ENCOUNTER — Encounter (HOSPITAL_COMMUNITY): Payer: Self-pay | Admitting: Pharmacy Technician

## 2012-04-10 ENCOUNTER — Encounter (HOSPITAL_COMMUNITY)
Admission: RE | Admit: 2012-04-10 | Discharge: 2012-04-10 | Disposition: A | Discharge status: Home / Self Care | Payer: Medicare Other | Source: Ambulatory Visit | Attending: Ophthalmology | Admitting: Ophthalmology

## 2012-04-10 ENCOUNTER — Encounter (HOSPITAL_COMMUNITY): Payer: Self-pay

## 2012-04-10 DIAGNOSIS — Z01812 Encounter for preprocedural laboratory examination: Secondary | ICD-10-CM | POA: Diagnosis not present

## 2012-04-10 DIAGNOSIS — H2589 Other age-related cataract: Secondary | ICD-10-CM | POA: Diagnosis not present

## 2012-04-10 DIAGNOSIS — I10 Essential (primary) hypertension: Secondary | ICD-10-CM | POA: Diagnosis not present

## 2012-04-10 LAB — BASIC METABOLIC PANEL
BUN: 24 mg/dL — ABNORMAL HIGH (ref 6–23)
GFR calc non Af Amer: 55 mL/min — ABNORMAL LOW (ref >90)
Glucose, Bld: 89 mg/dL (ref 70–99)
Potassium: 4.4 mEq/L (ref 3.5–5.1)

## 2012-04-10 NOTE — Patient Instructions (Signed)
20 Angelica [name]  04/10/2012   Your procedure is scheduled on:  04-17-2012  Report to [facility] at 0700 AM.  Call this number if you have problems the morning of surgery: [phone]   Remember:   Do not eat food:After Midnight.    Clear liquids include soda, tea, black coffee, apple or grape juice, broth.  Take these medicines the morning of surgery with A SIP OF WATER: Norvasc, Vasotec, Synthroid, and metoprolol   Do not wear jewelry, make-up or nail polish.  Do not wear lotions, powders, or perfumes. You may wear deodorant.  Do not shave 48 hours prior to surgery. Men may shave face and neck.  Do not bring valuables to the hospital.  Contacts, dentures or bridgework may not be worn into surgery.  Leave suitcase in the car. After surgery it may be brought to your room.  For patients admitted to the hospital, checkout time is 11:00 AM the day of discharge.   Patients discharged the day of surgery will not be allowed to drive home.  Name and phone number of your driver: husband and daughter  Special Instructions: N/A   Please read over the following fact sheets that you were given: Care and Recovery After Surgery  Cataract A cataract is a clouding of the lens of the eye. When a lens becomes cloudy, vision is reduced based on the degree and nature of the clouding. Many cataracts reduce vision to some degree. Some cataracts make people more near-sighted as they develop. Other cataracts increase glare. Cataracts that are ignored and become worse can sometimes look white. The white color can be seen through the pupil. CAUSES   Aging. However, cataracts may occur at any age, even in newborns.  Certain drugs.  Trauma to the eye.  Certain diseases such as diabetes.  Specific eye diseases such as chronic inflammation inside the eye or a sudden attack of a rare form of glaucoma.  Inherited or acquired medical problems. SYMPTOMS   Gradual, progressive drop in vision in the affected  eye.  Severe, rapid visual loss. This most often happens when trauma is the cause. DIAGNOSIS  To detect a cataract, an eye doctor examines the lens. Cataracts are best diagnosed with an exam of the eyes with the pupils enlarged (dilated) by drops.  TREATMENT  For an early cataract, vision may improve by using different eyeglasses or stronger lighting. If that does not help your vision, surgery is the only effective treatment. A cataract needs to be surgically removed when vision loss interferes with your everyday activities, such as driving, reading, or watching TV. A cataract may also have to be removed if it prevents examination or treatment of another eye problem. Surgery removes the cloudy lens and usually replaces it with a substitute lens (intraocular lens, IOL).  At a time when both you and your doctor agree, the cataract will be surgically removed. If you have cataracts in both eyes, only one is usually removed at a time. This allows the operated eye to heal and be out of danger from any possible problems after surgery (such as infection or poor wound healing). In rare cases, a cataract may be doing damage to your eye. In these cases, your caregiver may advise surgical removal right away. The vast majority of people who have cataract surgery have better vision afterward. HOME CARE INSTRUCTIONS  If you are not planning surgery, you may be asked to do the following:  Use different eyeglasses.  Use stronger or  brighter lighting.  Ask your eye doctor about reducing your medicine dose or changing medicines if it is thought that a medicine caused your cataract. Changing medicines does not make the cataract go away on its own.  Become familiar with your surroundings. Poor vision can lead to injury. Avoid bumping into things on the affected side. You are at a higher risk for tripping or falling.  Exercise extreme care when driving or operating machinery.  Wear sunglasses if you are sensitive to  bright light or experiencing problems with glare. SEEK IMMEDIATE MEDICAL CARE IF:   You have a worsening or sudden vision loss.  You notice redness, swelling, or increasing pain in the eye.  You have a fever. Document Released: 05/28/2005 Document Revised: 08/20/2011 Document Reviewed: 01/19/2011 [facility] Patient Information 2013 [place], [place].

## 2012-04-16 MED ORDER — CYCLOPENTOLATE-PHENYLEPHRINE 0.2-1 % OP SOLN
OPHTHALMIC | Status: AC
  Filled 2012-04-16: qty 2

## 2012-04-16 MED ORDER — LIDOCAINE HCL 3.5 % OP GEL
OPHTHALMIC | Status: AC
  Filled 2012-04-16: qty 5

## 2012-04-16 MED ORDER — LIDOCAINE HCL (PF) 1 % IJ SOLN
INTRAMUSCULAR | Status: AC
  Filled 2012-04-16: qty 2

## 2012-04-16 MED ORDER — PHENYLEPHRINE HCL 2.5 % OP SOLN
OPHTHALMIC | Status: AC
  Filled 2012-04-16: qty 2

## 2012-04-16 MED ORDER — TETRACAINE HCL 0.5 % OP SOLN
OPHTHALMIC | Status: AC
  Filled 2012-04-16: qty 2

## 2012-04-16 MED ORDER — NEOMYCIN-POLYMYXIN-DEXAMETH 3.5-10000-0.1 OP OINT
TOPICAL_OINTMENT | OPHTHALMIC | Status: AC
  Filled 2012-04-16: qty 3.5

## 2012-04-17 ENCOUNTER — Encounter (HOSPITAL_COMMUNITY)
Admission: RE | Disposition: A | Discharge status: Home / Self Care | Payer: Self-pay | Source: Ambulatory Visit | Attending: Ophthalmology

## 2012-04-17 ENCOUNTER — Ambulatory Visit (HOSPITAL_COMMUNITY)
Admission: RE | Admit: 2012-04-17 | Discharge: 2012-04-17 | Disposition: A | Discharge status: Home / Self Care | Payer: Medicare Other | Source: Ambulatory Visit | Attending: Ophthalmology | Admitting: Ophthalmology

## 2012-04-17 ENCOUNTER — Encounter (HOSPITAL_COMMUNITY): Payer: Self-pay | Admitting: Anesthesiology

## 2012-04-17 ENCOUNTER — Ambulatory Visit (HOSPITAL_COMMUNITY): Payer: Medicare Other | Admitting: Anesthesiology

## 2012-04-17 ENCOUNTER — Encounter (HOSPITAL_COMMUNITY): Payer: Self-pay | Admitting: *Deleted

## 2012-04-17 DIAGNOSIS — I10 Essential (primary) hypertension: Secondary | ICD-10-CM | POA: Diagnosis not present

## 2012-04-17 DIAGNOSIS — H269 Unspecified cataract: Secondary | ICD-10-CM | POA: Diagnosis not present

## 2012-04-17 DIAGNOSIS — H2589 Other age-related cataract: Secondary | ICD-10-CM | POA: Insufficient documentation

## 2012-04-17 DIAGNOSIS — Z01812 Encounter for preprocedural laboratory examination: Secondary | ICD-10-CM | POA: Diagnosis not present

## 2012-04-17 HISTORY — PX: CATARACT EXTRACTION W/PHACO: SHX586

## 2012-04-17 SURGERY — PHACOEMULSIFICATION, CATARACT, WITH IOL INSERTION
Anesthesia: Monitor Anesthesia Care | Site: Eye | Laterality: Right | Wound class: Clean

## 2012-04-17 MED ORDER — LIDOCAINE HCL (PF) 1 % IJ SOLN
INTRAOCULAR | Status: DC | PRN
  Administered 2012-04-17: 09:00:00 via OPHTHALMIC

## 2012-04-17 MED ORDER — PHENYLEPHRINE HCL 2.5 % OP SOLN
1.0000 [drp] | OPHTHALMIC | Status: AC
  Administered 2012-04-17 (×3): 1 [drp] via OPHTHALMIC

## 2012-04-17 MED ORDER — CYCLOPENTOLATE-PHENYLEPHRINE 0.2-1 % OP SOLN
1.0000 [drp] | OPHTHALMIC | Status: AC
  Administered 2012-04-17 (×3): 1 [drp] via OPHTHALMIC

## 2012-04-17 MED ORDER — PROVISC 10 MG/ML IO SOLN
INTRAOCULAR | Status: DC | PRN
  Administered 2012-04-17: 8.5 mg via INTRAOCULAR

## 2012-04-17 MED ORDER — BSS IO SOLN
INTRAOCULAR | Status: DC | PRN
  Administered 2012-04-17: 15 mL via INTRAOCULAR

## 2012-04-17 MED ORDER — TETRACAINE HCL 0.5 % OP SOLN
1.0000 [drp] | OPHTHALMIC | Status: AC
  Administered 2012-04-17 (×3): 1 [drp] via OPHTHALMIC

## 2012-04-17 MED ORDER — FENTANYL CITRATE 0.05 MG/ML IJ SOLN: 25.0000 ug | INTRAMUSCULAR | Status: DC | PRN

## 2012-04-17 MED ORDER — LIDOCAINE HCL 3.5 % OP GEL
1.0000 "application " | Freq: Once | OPHTHALMIC | Status: AC
  Administered 2012-04-17: 1 via OPHTHALMIC

## 2012-04-17 MED ORDER — EPINEPHRINE HCL 1 MG/ML IJ SOLN
INTRAOCULAR | Status: DC | PRN
  Administered 2012-04-17: 09:00:00

## 2012-04-17 MED ORDER — POVIDONE-IODINE 5 % OP SOLN
OPHTHALMIC | Status: DC | PRN
  Administered 2012-04-17: 1 via OPHTHALMIC

## 2012-04-17 MED ORDER — LACTATED RINGERS IV SOLN
INTRAVENOUS | Status: DC
  Administered 2012-04-17: 1000 mL via INTRAVENOUS

## 2012-04-17 MED ORDER — LIDOCAINE 3.5 % OP GEL OPTIME - NO CHARGE
OPHTHALMIC | Status: DC | PRN
  Administered 2012-04-17: 1 [drp] via OPHTHALMIC

## 2012-04-17 MED ORDER — CYCLOPENTOLATE HCL 1 % OP SOLN: 1.0000 [drp] | OPHTHALMIC | Status: AC

## 2012-04-17 MED ORDER — ONDANSETRON HCL 4 MG/2ML IJ SOLN: 4.0000 mg | Freq: Once | INTRAMUSCULAR | Status: DC | PRN

## 2012-04-17 MED ORDER — MIDAZOLAM HCL 2 MG/2ML IJ SOLN
INTRAMUSCULAR | Status: AC
  Filled 2012-04-17: qty 2

## 2012-04-17 MED ORDER — MIDAZOLAM HCL 2 MG/2ML IJ SOLN
1.0000 mg | INTRAMUSCULAR | Status: DC | PRN
  Administered 2012-04-17: 2 mg via INTRAVENOUS

## 2012-04-17 MED ORDER — NEOMYCIN-POLYMYXIN-DEXAMETH 0.1 % OP OINT
TOPICAL_OINTMENT | OPHTHALMIC | Status: DC | PRN
  Administered 2012-04-17: 1 via OPHTHALMIC

## 2012-04-17 MED ORDER — EPINEPHRINE HCL 1 MG/ML IJ SOLN
INTRAMUSCULAR | Status: AC
  Filled 2012-04-17: qty 1

## 2012-04-17 SURGICAL SUPPLY — 32 items

## 2012-04-17 NOTE — Anesthesia Postprocedure Evaluation (Signed)
  Anesthesia Post-op Note  Patient: Angelica [name]  Procedure(s) Performed: Procedure(s) (LRB) with comments: CATARACT EXTRACTION PHACO AND INTRAOCULAR LENS PLACEMENT (IOC) (Right) - CDE:13.40  Patient Location: Short Stay  Anesthesia Type:MAC  Level of Consciousness: awake, alert , oriented and patient cooperative  Airway and Oxygen Therapy: Patient Spontanous Breathing  Post-op Pain: none  Post-op Assessment: Post-op Vital signs reviewed, Patient's Cardiovascular Status Stable, Respiratory Function Stable, Patent Airway and Pain level controlled  Post-op Vital Signs: Reviewed and stable  Complications: No apparent anesthesia complications

## 2012-04-17 NOTE — Anesthesia Preprocedure Evaluation (Signed)
Anesthesia Evaluation  Patient identified by MRN, date of birth, ID band Patient awake    Reviewed: Allergy & Precautions, H&P , NPO status , Patient's Chart, lab work & pertinent test results, reviewed documented beta blocker date and time   History of Anesthesia Complications Negative for: history of anesthetic complications  Airway Mallampati: III      Dental  (+) Teeth Intact   Pulmonary neg pulmonary ROS,  breath sounds clear to auscultation        Cardiovascular hypertension, Pt. on medications + CAD (no CP now, no syncope) and + Past MI Rhythm:Regular Rate:Normal     Neuro/Psych    GI/Hepatic   Endo/Other  Hypothyroidism   Renal/GU      Musculoskeletal   Abdominal   Peds  Hematology   Anesthesia Other Findings   Reproductive/Obstetrics                           Anesthesia Physical Anesthesia Plan  ASA: III  Anesthesia Plan: MAC   Post-op Pain Management:    Induction: Intravenous  Airway Management Planned: Nasal Cannula  Additional Equipment:   Intra-op Plan:   Post-operative Plan:   Informed Consent: I have reviewed the patients History and Physical, chart, labs and discussed the procedure including the risks, benefits and alternatives for the proposed anesthesia with the patient or authorized representative who has indicated his/her understanding and acceptance.     Plan Discussed with:   Anesthesia Plan Comments:         Anesthesia Quick Evaluation

## 2012-04-17 NOTE — Transfer of Care (Signed)
Immediate Anesthesia Transfer of Care Note  Patient: Angelica [name]  Procedure(s) Performed: Procedure(s) (LRB) with comments: CATARACT EXTRACTION PHACO AND INTRAOCULAR LENS PLACEMENT (IOC) (Right) - CDE:13.40  Patient Location: PACU and Short Stay  Anesthesia Type:MAC  Level of Consciousness: awake, alert , oriented and patient cooperative  Airway & Oxygen Therapy: Patient Spontanous Breathing  Post-op Assessment: Report given to PACU RN and Post -op Vital signs reviewed and stable  Post vital signs: Reviewed and stable  Complications: No apparent anesthesia complications

## 2012-04-17 NOTE — Brief Op Note (Signed)
Pre-Op Dx: Cataract OD Post-Op Dx: Cataract OD Surgeon: Dax Murguia Anesthesia: Topical with MAC Surgery: Cataract Extraction with Intraocular lens Implant OD Implant: B&L enVista Specimen: None Complications: None 

## 2012-04-17 NOTE — H&P (Signed)
I have reviewed the H&P, the patient was re-examined, and I have identified no interval changes in medical condition and plan of care since the history and physical of record  

## 2012-04-18 NOTE — Op Note (Signed)
[name], Angelica [name]                 ACCOUNT NO.:  [id number]  MEDICAL RECORD NO.:  [id number]  LOCATION:  [facility]                          FACILITY:  [facility]  PHYSICIAN:  Susanne Greenhouse, MD       DATE OF BIRTH:  [birth year]  DATE OF PROCEDURE:  04/17/2012 DATE OF DISCHARGE:  04/17/2012                              OPERATIVE REPORT   PREOPERATIVE DIAGNOSIS:  Combined cataract, right eye, diagnosis code 366.19.  POSTOPERATIVE DIAGNOSIS:  Combined cataract, right eye, diagnosis code 366.19.  OPERATION PERFORMED:  Phacoemulsification with posterior chamber intraocular lens implantation, right eye.  SURGEON:  Bonne Dolores. Macaila Tahir, MD  ANESTHESIA:  Topical with IV sedation.  OPERATIVE SUMMARY: In the preoperative area, dilating drops were placed into the right eye. The patient was then brought into the operating room where she was placed under general anesthesia.  The eye was then prepped and draped. Beginning with a 75 blade, a paracentesis port was made at the surgeon's 2 o'clock position.  The anterior chamber was then filled with a 1% nonpreserved lidocaine solution with epinephrine.  This was followed by Viscoat to deepen the chamber.  A small fornix-based peritomy was performed superiorly.  Next, a single iris hook was placed through the limbus superiorly.  A 2.4-mm keratome blade was then used to make a clear corneal incision over the iris hook.  A bent cystotome needle and Utrata forceps were used to create a continuous tear capsulotomy. Hydrodissection was performed using balanced salt solution on a fine cannula.  The lens nucleus was then removed using phacoemulsification in a quadrant cracking technique.  The cortical material was then removed with irrigation and aspiration.  The capsular bag and anterior chamber were refilled with Provisc.  The wound was widened to approximately 3 mm and a posterior chamber intraocular lens was placed into the capsular bag without difficulty using an  [employer] lens injecting system.  A single 10-0 nylon suture was then used to close the incision as well as stromal hydration.  The Provisc was removed from the anterior chamber and capsular bag with irrigation and aspiration.  At this point, the wounds were tested for leak, which were negative.  The anterior chamber remained deep and stable.  The patient tolerated the procedure well.  There were no operative complications, and she awoke from general anesthesia without problem.  No surgical specimens.  Prosthetic device used is a [employer] enVista posterior chamber lens, model #MX60, power of 20.0, serial number is [id number].          ______________________________ Susanne Greenhouse, MD     KEH/MEDQ  D:  04/17/2012  T:  04/18/2012  Job:  [id number]

## 2012-04-21 ENCOUNTER — Encounter (HOSPITAL_COMMUNITY): Payer: Self-pay | Admitting: Ophthalmology

## 2012-05-01 DIAGNOSIS — C50219 Malignant neoplasm of upper-inner quadrant of unspecified female breast: Secondary | ICD-10-CM

## 2012-05-01 DIAGNOSIS — I890 Lymphedema, not elsewhere classified: Secondary | ICD-10-CM

## 2012-05-06 DIAGNOSIS — I890 Lymphedema, not elsewhere classified: Secondary | ICD-10-CM | POA: Diagnosis not present

## 2012-05-06 DIAGNOSIS — C50919 Malignant neoplasm of unspecified site of unspecified female breast: Secondary | ICD-10-CM | POA: Diagnosis not present

## 2012-05-06 DIAGNOSIS — IMO0001 Reserved for inherently not codable concepts without codable children: Secondary | ICD-10-CM | POA: Diagnosis not present

## 2012-05-13 DIAGNOSIS — IMO0001 Reserved for inherently not codable concepts without codable children: Secondary | ICD-10-CM | POA: Diagnosis not present

## 2012-05-13 DIAGNOSIS — I890 Lymphedema, not elsewhere classified: Secondary | ICD-10-CM | POA: Diagnosis not present

## 2012-05-19 DIAGNOSIS — E063 Autoimmune thyroiditis: Secondary | ICD-10-CM | POA: Diagnosis not present

## 2012-05-19 DIAGNOSIS — E039 Hypothyroidism, unspecified: Secondary | ICD-10-CM | POA: Diagnosis not present

## 2012-05-23 DIAGNOSIS — E039 Hypothyroidism, unspecified: Secondary | ICD-10-CM | POA: Diagnosis not present

## 2012-05-23 DIAGNOSIS — I10 Essential (primary) hypertension: Secondary | ICD-10-CM | POA: Diagnosis not present

## 2012-05-23 DIAGNOSIS — E063 Autoimmune thyroiditis: Secondary | ICD-10-CM | POA: Diagnosis not present

## 2012-05-23 DIAGNOSIS — E042 Nontoxic multinodular goiter: Secondary | ICD-10-CM | POA: Diagnosis not present

## 2012-06-13 DIAGNOSIS — I890 Lymphedema, not elsewhere classified: Secondary | ICD-10-CM | POA: Diagnosis not present

## 2012-06-13 DIAGNOSIS — Z5189 Encounter for other specified aftercare: Secondary | ICD-10-CM | POA: Diagnosis not present

## 2012-06-14 DIAGNOSIS — Z6833 Body mass index (BMI) 33.0-33.9, adult: Secondary | ICD-10-CM | POA: Diagnosis not present

## 2012-06-14 DIAGNOSIS — L500 Allergic urticaria: Secondary | ICD-10-CM | POA: Diagnosis not present

## 2012-06-17 DIAGNOSIS — I890 Lymphedema, not elsewhere classified: Secondary | ICD-10-CM | POA: Diagnosis not present

## 2012-06-17 DIAGNOSIS — Z5189 Encounter for other specified aftercare: Secondary | ICD-10-CM | POA: Diagnosis not present

## 2012-06-18 DIAGNOSIS — I890 Lymphedema, not elsewhere classified: Secondary | ICD-10-CM | POA: Diagnosis not present

## 2012-06-18 DIAGNOSIS — Z5189 Encounter for other specified aftercare: Secondary | ICD-10-CM | POA: Diagnosis not present

## 2012-06-20 DIAGNOSIS — Z5189 Encounter for other specified aftercare: Secondary | ICD-10-CM | POA: Diagnosis not present

## 2012-06-20 DIAGNOSIS — I890 Lymphedema, not elsewhere classified: Secondary | ICD-10-CM | POA: Diagnosis not present

## 2012-07-03 DIAGNOSIS — I10 Essential (primary) hypertension: Secondary | ICD-10-CM | POA: Diagnosis not present

## 2012-07-03 DIAGNOSIS — R5383 Other fatigue: Secondary | ICD-10-CM | POA: Diagnosis not present

## 2012-07-03 DIAGNOSIS — Z79899 Other long term (current) drug therapy: Secondary | ICD-10-CM | POA: Diagnosis not present

## 2012-07-03 DIAGNOSIS — E782 Mixed hyperlipidemia: Secondary | ICD-10-CM | POA: Diagnosis not present

## 2012-07-03 DIAGNOSIS — E039 Hypothyroidism, unspecified: Secondary | ICD-10-CM | POA: Diagnosis not present

## 2012-07-03 DIAGNOSIS — R5381 Other malaise: Secondary | ICD-10-CM | POA: Diagnosis not present

## 2012-10-15 DIAGNOSIS — R928 Other abnormal and inconclusive findings on diagnostic imaging of breast: Secondary | ICD-10-CM | POA: Diagnosis not present

## 2012-10-15 DIAGNOSIS — Z1231 Encounter for screening mammogram for malignant neoplasm of breast: Secondary | ICD-10-CM | POA: Diagnosis not present

## 2012-11-17 ENCOUNTER — Encounter: Payer: Medicare Other | Admitting: Internal Medicine

## 2012-11-17 DIAGNOSIS — I890 Lymphedema, not elsewhere classified: Secondary | ICD-10-CM

## 2012-11-17 DIAGNOSIS — C50919 Malignant neoplasm of unspecified site of unspecified female breast: Secondary | ICD-10-CM

## 2012-11-25 DIAGNOSIS — E785 Hyperlipidemia, unspecified: Secondary | ICD-10-CM | POA: Diagnosis not present

## 2012-11-25 DIAGNOSIS — Z0000 Encounter for general adult medical examination without abnormal findings: Secondary | ICD-10-CM | POA: Diagnosis not present

## 2012-11-25 DIAGNOSIS — Z6834 Body mass index (BMI) 34.0-34.9, adult: Secondary | ICD-10-CM | POA: Diagnosis not present

## 2012-11-25 DIAGNOSIS — E039 Hypothyroidism, unspecified: Secondary | ICD-10-CM | POA: Diagnosis not present

## 2012-11-25 DIAGNOSIS — Z79899 Other long term (current) drug therapy: Secondary | ICD-10-CM | POA: Diagnosis not present

## 2012-12-11 ENCOUNTER — Ambulatory Visit (INDEPENDENT_AMBULATORY_CARE_PROVIDER_SITE_OTHER): Payer: Medicare Other | Admitting: Otolaryngology

## 2012-12-18 DIAGNOSIS — I10 Essential (primary) hypertension: Secondary | ICD-10-CM | POA: Diagnosis not present

## 2012-12-18 DIAGNOSIS — E782 Mixed hyperlipidemia: Secondary | ICD-10-CM | POA: Diagnosis not present

## 2012-12-20 ENCOUNTER — Encounter: Payer: Self-pay | Admitting: Cardiovascular Disease

## 2013-02-11 ENCOUNTER — Other Ambulatory Visit: Payer: Self-pay

## 2013-02-11 MED ORDER — PANTOPRAZOLE SODIUM 40 MG PO TBEC: 40.0000 mg | DELAYED_RELEASE_TABLET | Freq: Every day | ORAL | Status: DC

## 2013-02-11 NOTE — Telephone Encounter (Signed)
Rx was sent to pharmacy electronically via [employer].  

## 2013-02-20 DIAGNOSIS — M2550 Pain in unspecified joint: Secondary | ICD-10-CM | POA: Diagnosis not present

## 2013-02-20 DIAGNOSIS — Z23 Encounter for immunization: Secondary | ICD-10-CM | POA: Diagnosis not present

## 2013-02-20 DIAGNOSIS — E039 Hypothyroidism, unspecified: Secondary | ICD-10-CM | POA: Diagnosis not present

## 2013-02-20 DIAGNOSIS — E785 Hyperlipidemia, unspecified: Secondary | ICD-10-CM | POA: Diagnosis not present

## 2013-02-20 DIAGNOSIS — Z6833 Body mass index (BMI) 33.0-33.9, adult: Secondary | ICD-10-CM | POA: Diagnosis not present

## 2013-02-20 DIAGNOSIS — I10 Essential (primary) hypertension: Secondary | ICD-10-CM | POA: Diagnosis not present

## 2013-03-19 ENCOUNTER — Encounter (INDEPENDENT_AMBULATORY_CARE_PROVIDER_SITE_OTHER): Payer: Self-pay

## 2013-03-19 ENCOUNTER — Ambulatory Visit (INDEPENDENT_AMBULATORY_CARE_PROVIDER_SITE_OTHER): Payer: Medicare Other | Admitting: Otolaryngology

## 2013-03-19 DIAGNOSIS — H903 Sensorineural hearing loss, bilateral: Secondary | ICD-10-CM

## 2013-03-19 DIAGNOSIS — H908 Mixed conductive and sensorineural hearing loss, unspecified: Secondary | ICD-10-CM | POA: Diagnosis not present

## 2013-03-20 ENCOUNTER — Other Ambulatory Visit (INDEPENDENT_AMBULATORY_CARE_PROVIDER_SITE_OTHER): Payer: Self-pay | Admitting: Otolaryngology

## 2013-03-20 DIAGNOSIS — H918X9 Other specified hearing loss, unspecified ear: Secondary | ICD-10-CM

## 2013-03-30 ENCOUNTER — Ambulatory Visit
Admission: RE | Admit: 2013-03-30 | Discharge: 2013-03-30 | Disposition: A | Discharge status: Home / Self Care | Payer: Medicare Other | Source: Ambulatory Visit | Attending: Otolaryngology | Admitting: Otolaryngology

## 2013-03-30 DIAGNOSIS — H918X9 Other specified hearing loss, unspecified ear: Secondary | ICD-10-CM

## 2013-03-30 DIAGNOSIS — R269 Unspecified abnormalities of gait and mobility: Secondary | ICD-10-CM | POA: Diagnosis not present

## 2013-03-30 DIAGNOSIS — H9190 Unspecified hearing loss, unspecified ear: Secondary | ICD-10-CM | POA: Diagnosis not present

## 2013-03-30 MED ORDER — GADOBENATE DIMEGLUMINE 529 MG/ML IV SOLN
16.0000 mL | Freq: Once | INTRAVENOUS | Status: AC | PRN
  Administered 2013-03-30: 16 mL via INTRAVENOUS

## 2013-05-05 DIAGNOSIS — H905 Unspecified sensorineural hearing loss: Secondary | ICD-10-CM | POA: Diagnosis not present

## 2013-05-12 ENCOUNTER — Other Ambulatory Visit (HOSPITAL_COMMUNITY): Payer: Self-pay | Admitting: Internal Medicine

## 2013-05-12 DIAGNOSIS — I10 Essential (primary) hypertension: Secondary | ICD-10-CM | POA: Diagnosis not present

## 2013-05-12 DIAGNOSIS — E039 Hypothyroidism, unspecified: Secondary | ICD-10-CM | POA: Diagnosis not present

## 2013-05-12 DIAGNOSIS — J0100 Acute maxillary sinusitis, unspecified: Secondary | ICD-10-CM | POA: Diagnosis not present

## 2013-05-12 DIAGNOSIS — Z0000 Encounter for general adult medical examination without abnormal findings: Secondary | ICD-10-CM

## 2013-05-12 DIAGNOSIS — Z6833 Body mass index (BMI) 33.0-33.9, adult: Secondary | ICD-10-CM | POA: Diagnosis not present

## 2013-05-12 DIAGNOSIS — J209 Acute bronchitis, unspecified: Secondary | ICD-10-CM | POA: Diagnosis not present

## 2013-05-15 DIAGNOSIS — E7800 Pure hypercholesterolemia, unspecified: Secondary | ICD-10-CM | POA: Diagnosis not present

## 2013-05-15 DIAGNOSIS — I10 Essential (primary) hypertension: Secondary | ICD-10-CM | POA: Diagnosis not present

## 2013-05-15 DIAGNOSIS — E063 Autoimmune thyroiditis: Secondary | ICD-10-CM | POA: Diagnosis not present

## 2013-05-15 DIAGNOSIS — N6019 Diffuse cystic mastopathy of unspecified breast: Secondary | ICD-10-CM | POA: Diagnosis not present

## 2013-05-15 DIAGNOSIS — R609 Edema, unspecified: Secondary | ICD-10-CM | POA: Diagnosis not present

## 2013-05-15 DIAGNOSIS — E039 Hypothyroidism, unspecified: Secondary | ICD-10-CM | POA: Diagnosis not present

## 2013-05-22 DIAGNOSIS — E063 Autoimmune thyroiditis: Secondary | ICD-10-CM | POA: Diagnosis not present

## 2013-05-22 DIAGNOSIS — E7800 Pure hypercholesterolemia, unspecified: Secondary | ICD-10-CM | POA: Diagnosis not present

## 2013-05-22 DIAGNOSIS — N951 Menopausal and female climacteric states: Secondary | ICD-10-CM | POA: Diagnosis not present

## 2013-05-22 DIAGNOSIS — E039 Hypothyroidism, unspecified: Secondary | ICD-10-CM | POA: Diagnosis not present

## 2013-05-22 DIAGNOSIS — E785 Hyperlipidemia, unspecified: Secondary | ICD-10-CM | POA: Diagnosis not present

## 2013-05-22 DIAGNOSIS — E042 Nontoxic multinodular goiter: Secondary | ICD-10-CM | POA: Diagnosis not present

## 2013-05-25 ENCOUNTER — Other Ambulatory Visit (HOSPITAL_COMMUNITY): Payer: Self-pay | Admitting: Internal Medicine

## 2013-05-25 DIAGNOSIS — M899 Disorder of bone, unspecified: Secondary | ICD-10-CM

## 2013-05-27 ENCOUNTER — Ambulatory Visit (HOSPITAL_COMMUNITY)
Admission: RE | Admit: 2013-05-27 | Discharge: 2013-05-27 | Disposition: A | Discharge status: Home / Self Care | Payer: Medicare Other | Source: Ambulatory Visit | Attending: Internal Medicine | Admitting: Internal Medicine

## 2013-05-27 DIAGNOSIS — M899 Disorder of bone, unspecified: Secondary | ICD-10-CM

## 2013-06-18 ENCOUNTER — Ambulatory Visit: Payer: Medicare Other | Admitting: Cardiology

## 2013-06-19 ENCOUNTER — Encounter: Payer: Self-pay | Admitting: Cardiology

## 2013-06-19 ENCOUNTER — Ambulatory Visit (INDEPENDENT_AMBULATORY_CARE_PROVIDER_SITE_OTHER): Payer: Medicare Other | Admitting: Cardiology

## 2013-06-19 VITALS — BP 156/93 | HR 78 | Ht 60.0 in | Wt 175.4 lb

## 2013-06-19 DIAGNOSIS — R609 Edema, unspecified: Secondary | ICD-10-CM

## 2013-06-19 DIAGNOSIS — E785 Hyperlipidemia, unspecified: Secondary | ICD-10-CM

## 2013-06-19 DIAGNOSIS — I2510 Atherosclerotic heart disease of native coronary artery without angina pectoris: Secondary | ICD-10-CM | POA: Diagnosis not present

## 2013-06-19 DIAGNOSIS — E669 Obesity, unspecified: Secondary | ICD-10-CM | POA: Diagnosis not present

## 2013-06-19 DIAGNOSIS — I10 Essential (primary) hypertension: Secondary | ICD-10-CM | POA: Diagnosis not present

## 2013-06-19 DIAGNOSIS — R079 Chest pain, unspecified: Secondary | ICD-10-CM

## 2013-06-19 DIAGNOSIS — R600 Localized edema: Secondary | ICD-10-CM

## 2013-06-19 NOTE — Patient Instructions (Signed)
Dr Ellyn Hack wants you to follow-up in 6 months.  You will receive a reminder letter in the mail two months in advance. If you don't receive a letter, please call our office to schedule the follow-up appointment.

## 2013-06-21 ENCOUNTER — Encounter: Payer: Self-pay | Admitting: Cardiology

## 2013-06-21 DIAGNOSIS — I2510 Atherosclerotic heart disease of native coronary artery without angina pectoris: Secondary | ICD-10-CM | POA: Insufficient documentation

## 2013-06-21 DIAGNOSIS — E669 Obesity, unspecified: Secondary | ICD-10-CM | POA: Insufficient documentation

## 2013-06-21 DIAGNOSIS — R600 Localized edema: Secondary | ICD-10-CM | POA: Insufficient documentation

## 2013-06-21 NOTE — Assessment & Plan Note (Signed)
Nonobstructive coronary disease in thousand 6. No active symptoms and negative Myoview. On aspirin statin, beta blocker and ACE inhibitor.

## 2013-06-21 NOTE — Progress Notes (Signed)
PATIENT: MICHAELIA PERMANN MRN: [id number] DOB: [birth year] PCP: [name]., MD  Clinic Note: Chief Complaint  Patient presents with  . ROV 6 months RAW-DH    C/o chest pain-just about daily since Christmas, occas shortness of breath, leg pain, rt ankle edema-r/t gout, and a little lightheadedness.   HPI: BERTINA OGLE is a 80 y.o. female with a PMH below who presents today for six-month followup and to establish new cardiology care at the retirement of Dr. Rollene Fare.  She mostly comes for evaluation along with her husband. She had heart catheterization in 2006 showed really minimal CAD. She also mildly in 2013 that was negative for ischemia along with an echocardiogram that was normal. She apparently had a cardiac arrest episode during partial thyroidectomy in the past.  Of note, she is just come from the audiologist where she had a hearing screen for hearing aids, and is quite worked up about having to have hearing aids.  Interval History: She comes in today doing relatively well. She has consistent left-sided chest discomfort immediately since Christmas time. It is associated occasionally with shortness of breath it usually is just occurring when she's at rest and not necessarily with exertion. The pain is along the left parasternal region as well as up underneath her left breast. She says that she will usually rub it and it feels better. She does get somewhat short of breath when she is walking back and forth on her driveway, but not routinely. She notes mild occasional palpitations but nothing consistent with a rapid irregular heartbeat/rate to suggest an arrhythmia.  No dyspnea at rest, and the chest discomfort is not exacerbated with exertion.  The remainder of cardiac review of systems is as follows: Cardiovascular ROS: positive for - chest pain, dyspnea on exertion, edema and Mild palpitations negative for - irregular heartbeat, loss of consciousness, murmur, orthopnea, paroxysmal  nocturnal dyspnea, rapid heart rate or shortness of breath : Additional cardiac review of systems: Lightheadedness - no, dizziness - no, syncope/near-syncope - no; TIA/amaurosis fugax - no Melena - no, hematochezia no; hematuria - no; nosebleeds - no; claudication - no  Past Medical History  Diagnosis Date  . HTN (hypertension)   . Hypothyroid   . Hyperlipidemia   . Anemia   . Gout   . Colon polyps     Previous colonoscopy by Dr. Irving Shows  . Breast cancer 2011    left; no adjuvent therapy, Dr. Sonny Dandy  . Arthritis   . Chronic bronchitis   . Diverticulosis of colon   . Hemorrhoids   . MI (myocardial infarction) 1973    By report.  . Coronary artery disease, non-occlusive 2006    Roughly 40% RCA.; Negative Myoview in June 2013    Prior Cardiac Evaluation and Past Surgical History: Past Surgical History  Procedure Laterality Date  . Abdominal hysterectomy      TAH  . Cholecystectomy    . Hemorrhoid surgery    . Colonoscopy       ?3 years ago. Per pt, diverticulosis, hemorrhoids  . Colonoscopy  04/06/2011    Procedure: COLONOSCOPY;  Surgeon: Dorothyann Peng, MD;  Location: [facility];  Service: Endoscopy;  Laterality: N/A;  9:30  . Mastectomy  2011    left  . Cataract extraction w/phaco  07/02/2011    Procedure: CATARACT EXTRACTION PHACO AND INTRAOCULAR LENS PLACEMENT (IOC);  Surgeon: Williams Che, MD;  Location: [facility];  Service: Ophthalmology;  Laterality: Left;  CDE:3.45  .  Cataract extraction w/phaco  04/17/2012    Procedure: CATARACT EXTRACTION PHACO AND INTRAOCULAR LENS PLACEMENT (IOC);  Surgeon: Tonny Branch, MD;  Location: [facility];  Service: Ophthalmology;  Laterality: Right;  CDE:13.40  . Cardiac catheterization  2006    Roughly 40% RCA lesion, otherwise normal.  . Nm myoview ltd  June 2013    No ischemia or infarction. Normal EF  . Transthoracic echocardiogram  June 2013    Normal EF, no significant valve disease.  . Laparoscopic lysis of adhesions  2008     Dr. Zella Richer with repair of incisional hernia noted -     Allergies  Allergen Reactions  . Bee Venom Anaphylaxis  . Ciprofloxacin Nausea And Vomiting  . Clindamycin Hcl     Does not know  . Flagyl [Metronidazole Hcl] Nausea And Vomiting  . Shellfish-Derived Products     Rash developed so patient has steered clear of any since last year  . Sulfa Antibiotics Nausea Only    Current Outpatient Prescriptions  Medication Sig Dispense Refill  . allopurinol (ZYLOPRIM) 300 MG tablet Take 300 mg by mouth daily.        Marland Kitchen amLODipine (NORVASC) 2.5 MG tablet Take 2.5 mg by mouth daily.        Marland Kitchen aspirin EC 81 MG tablet Take 81 mg by mouth daily.      Marland Kitchen atorvastatin (LIPITOR) 20 MG tablet Take 20 mg by mouth daily.      . enalapril (VASOTEC) 10 MG tablet Take 10 mg by mouth daily.      . furosemide (LASIX) 20 MG tablet Take 20 mg by mouth daily.       Marland Kitchen levothyroxine (SYNTHROID, LEVOTHROID) 50 MCG tablet Take 50 mcg by mouth daily.       . metoprolol succinate (TOPROL-XL) 50 MG 24 hr tablet Take 50 mg by mouth daily. Take with or immediately following a meal.      . Multiple Vitamins-Minerals (MULTIVITAMINS THER. W/MINERALS) TABS Take 1 tablet by mouth daily.        . pantoprazole (PROTONIX) 40 MG tablet Take 1 tablet (40 mg total) by mouth daily.  30 tablet  10  . potassium chloride SA (K-DUR,KLOR-CON) 20 MEQ tablet Take 20 mEq by mouth 2 (two) times daily.      Marland Kitchen Propylene Glycol (SYSTANE BALANCE) 0.6 % SOLN Place 1 drop into both eyes 2 (two) times daily.       No current facility-administered medications for this visit.    History   Social History Narrative   She is a very pleasant married African American woman with 2 children and 3 grandchildren. She does with her husband who is retired Social research officer, government. [place] now [place] in the [place] area with a roughly 1/4 mile driveway. She usually walks a mile about twice a day, one by herself and 1 with her husband. She also enjoys doing  gardening.    family history includes Breast cancer in her sister; Prostate cancer in her father. There is no history of Colon cancer, Anesthesia problems, Hypotension, Malignant hyperthermia, or Pseudochol deficiency.  ROS: A comprehensive Review of Systems - Negative except Symptoms noted in history of present illness as well as right ankle pain and swelling that may be well related to gout. edema is usually controlled with Lasix.  PHYSICAL EXAM BP 156/93  Pulse 78  Ht 5' (1.524 m)  Wt 175 lb 6.4 oz (79.561 kg)  BMI 34.26 kg/m2 General appearance: alert, cooperative,  appears stated age, mild distress and mildly obese Neck: no adenopathy, no carotid bruit, no JVD and supple, symmetrical, trachea midline Lungs: clear to auscultation bilaterally, normal percussion bilaterally and Nonlabored, good air movement Heart: normal apical impulse, regular rate and rhythm, S1, S2 normal and Soft 1/6 SEM at RUSB radiating to carotids; very diffuse tenderness along the left costochondral margin post parasternal and inframammary. Reproducible musculoskeletal pain Abdomen: soft, non-tender; bowel sounds normal; no masses,  no organomegaly Extremities: extremities normal, atraumatic, no cyanosis or edema and Mild increased swelling the right ankle but not warm to touch. Pulses: 2+ and symmetric - difficult to palpate radial pulses bilaterally - abnormal location; PT DP pulses normal bilaterally. Neurologic: Grossly normal  LGX:QJJHERDEY today: Yes Rate: 78 , Rhythm: NSR with PVCs. Otherwise normal  Recent Labs: None recently  ASSESSMENT / PLAN: Overall relatively stable cardiac standpoint. Blood pressure is a bit elevated today, but she is upset about her hearing test. Previous evaluations have been normal. I will not make any adjustments.  Chest pain with minimal risk for cardiac etiology Clearly reproducible chest pain on exam. This is most likely costochondritis type symptoms. It is recommended  that she treat this with Tylenol or ibuprofen.  Coronary artery disease, non-occlusive Nonobstructive coronary disease in thousand 6. No active symptoms and negative Myoview. On aspirin statin, beta blocker and ACE inhibitor.  Bilateral lower extremity edema Well controlled with Lasix.  Benign hypertension Mildly elevated today. She seems to be a little bit "out of sorts "with this being the second medical examinations today. She says at home her blood pressures are much better than this.. I am inclined to believe that it would not adjust medications at this time.  Obesity (BMI 30-39.9) She sees her doing relatively well with her exercising, the main thing is that she probably needs to watch her dietary intake. We talked about adjustments.  Hyperlipidemia On statin. Followed by PCP.   Orders Placed This Encounter  Procedures  . EKG 12-Lead   No orders of the defined types were placed in this encounter.    Followup: 6 months  Lavern Maslow W. Ellyn Hack, M.D., M.S. [facility] [address] [place], [place]  [address]  [phone] Pager # [phone]

## 2013-06-21 NOTE — Assessment & Plan Note (Signed)
Mildly elevated today. She seems to be a little bit "out of sorts "with this being the second medical examinations today. She says at home her blood pressures are much better than this.. I am inclined to believe that it would not adjust medications at this time.

## 2013-06-21 NOTE — Assessment & Plan Note (Signed)
On statin. Followed by PCP 

## 2013-06-21 NOTE — Assessment & Plan Note (Signed)
Well controlled with Lasix

## 2013-06-21 NOTE — Assessment & Plan Note (Signed)
She sees her doing relatively well with her exercising, the main thing is that she probably needs to watch her dietary intake. We talked about adjustments.

## 2013-06-21 NOTE — Assessment & Plan Note (Signed)
Clearly reproducible chest pain on exam. This is most likely costochondritis type symptoms. It is recommended that she treat this with Tylenol or ibuprofen.

## 2013-07-02 ENCOUNTER — Other Ambulatory Visit: Payer: Self-pay | Admitting: *Deleted

## 2013-07-02 MED ORDER — ATORVASTATIN CALCIUM 20 MG PO TABS: 20.0000 mg | ORAL_TABLET | Freq: Every day | ORAL | Status: DC

## 2013-07-06 ENCOUNTER — Encounter (HOSPITAL_COMMUNITY): Payer: Medicare Other | Attending: Hematology and Oncology

## 2013-07-06 ENCOUNTER — Encounter (HOSPITAL_COMMUNITY): Payer: Self-pay

## 2013-07-06 ENCOUNTER — Encounter (HOSPITAL_COMMUNITY): Payer: Medicare Other

## 2013-07-06 VITALS — BP 134/84 | HR 76 | Temp 97.6°F | Resp 18 | Ht 60.5 in | Wt 174.2 lb

## 2013-07-06 DIAGNOSIS — Z853 Personal history of malignant neoplasm of breast: Secondary | ICD-10-CM | POA: Insufficient documentation

## 2013-07-06 DIAGNOSIS — C50919 Malignant neoplasm of unspecified site of unspecified female breast: Secondary | ICD-10-CM | POA: Diagnosis not present

## 2013-07-06 DIAGNOSIS — E559 Vitamin D deficiency, unspecified: Secondary | ICD-10-CM

## 2013-07-06 DIAGNOSIS — Z09 Encounter for follow-up examination after completed treatment for conditions other than malignant neoplasm: Secondary | ICD-10-CM | POA: Insufficient documentation

## 2013-07-06 DIAGNOSIS — I2510 Atherosclerotic heart disease of native coronary artery without angina pectoris: Secondary | ICD-10-CM | POA: Insufficient documentation

## 2013-07-06 DIAGNOSIS — I252 Old myocardial infarction: Secondary | ICD-10-CM | POA: Insufficient documentation

## 2013-07-06 DIAGNOSIS — I10 Essential (primary) hypertension: Secondary | ICD-10-CM | POA: Diagnosis not present

## 2013-07-06 LAB — COMPREHENSIVE METABOLIC PANEL
ALBUMIN: 3.9 g/dL (ref 3.5–5.2)
ALK PHOS: 76 U/L (ref 39–117)
ALT: 14 U/L (ref 0–35)
AST: 17 U/L (ref 0–37)
BUN: 18 mg/dL (ref 6–23)
CO2: 29 mEq/L (ref 19–32)
Calcium: 9.2 mg/dL (ref 8.4–10.5)
Chloride: 98 mEq/L (ref 96–112)
Creatinine, Ser: 0.87 mg/dL (ref 0.50–1.10)
GFR calc Af Amer: 72 mL/min — ABNORMAL LOW (ref >90)
GFR calc non Af Amer: 62 mL/min — ABNORMAL LOW (ref >90)
Glucose, Bld: 101 mg/dL — ABNORMAL HIGH (ref 70–99)
POTASSIUM: 3.7 meq/L (ref 3.7–5.3)
SODIUM: 140 meq/L (ref 137–147)
TOTAL PROTEIN: 8 g/dL (ref 6.0–8.3)
Total Bilirubin: 1 mg/dL (ref 0.3–1.2)

## 2013-07-06 LAB — CBC WITH DIFFERENTIAL/PLATELET
BASOS PCT: 1 % (ref 0–1)
Basophils Absolute: 0.1 10*3/uL (ref 0.0–0.1)
EOS ABS: 0.1 10*3/uL (ref 0.0–0.7)
Eosinophils Relative: 1 % (ref 0–5)
HCT: 40.4 % (ref 36.0–46.0)
Hemoglobin: 13.2 g/dL (ref 12.0–15.0)
Lymphocytes Relative: 29 % (ref 12–46)
Lymphs Abs: 2.2 10*3/uL (ref 0.7–4.0)
MCH: 31.5 pg (ref 26.0–34.0)
MCHC: 32.7 g/dL (ref 30.0–36.0)
MCV: 96.4 fL (ref 78.0–100.0)
MONOS PCT: 6 % (ref 3–12)
Monocytes Absolute: 0.4 10*3/uL (ref 0.1–1.0)
NEUTROS PCT: 64 % (ref 43–77)
Neutro Abs: 4.8 10*3/uL (ref 1.7–7.7)
PLATELETS: 194 10*3/uL (ref 150–400)
RBC: 4.19 MIL/uL (ref 3.87–5.11)
RDW: 14.2 % (ref 11.5–15.5)
WBC: 7.5 10*3/uL (ref 4.0–10.5)

## 2013-07-06 NOTE — Progress Notes (Signed)
Angelica [name] presented for labwork. Labs per MD order drawn via Peripheral Line 23 gauge needle inserted in right AC  Good blood return present. Procedure without incident.  Needle removed intact. Patient tolerated procedure well.

## 2013-07-06 NOTE — Patient Instructions (Signed)
[facility] Discharge Instructions  RECOMMENDATIONS MADE BY THE CONSULTANT AND ANY TEST RESULTS WILL BE SENT TO YOUR REFERRING PHYSICIAN.  EXAM FINDINGS BY THE PHYSICIAN TODAY AND SIGNS OR SYMPTOMS TO REPORT TO CLINIC OR PRIMARY PHYSICIAN: Exam and findings as discussed by Dr. Barnet Glasgow.  You will need to have your mammogram in May.  We will check some blood work today and if there are any problems we will call you. Report any new lumps, bone pain, shortness of breath or other symptoms.  MEDICATIONS PRESCRIBED:  none  INSTRUCTIONS/FOLLOW-UP: Follow-up in 6 months with lab work and MD visit.  Thank you for choosing [facility] to provide your oncology and hematology care.  To afford each patient quality time with our providers, please arrive at least 15 minutes before your scheduled appointment time.  With your help, our goal is to use those 15 minutes to complete the necessary work-up to ensure our physicians have the information they need to help with your evaluation and healthcare recommendations.    Effective January 1st, 2014, we ask that you re-schedule your appointment with our physicians should you arrive 10 or more minutes late for your appointment.  We strive to give you quality time with our providers, and arriving late affects you and other patients whose appointments are after yours.    Again, thank you for choosing [facility].  Our hope is that these requests will decrease the amount of time that you wait before being seen by our physicians.       _____________________________________________________________  Should you have questions after your visit to [facility], please contact our office at [phone] between the hours of 8:30 a.m. and 5:00 p.m.  Voicemails left after 4:30 p.m. will not be returned until the following business day.  For prescription refill requests, have your pharmacy contact our office with your  prescription refill request.

## 2013-07-06 NOTE — Progress Notes (Signed)
Enoch A. Barnet Glasgow, M.D.  NEW PATIENT EVALUATION   Name: Angelica [name] Date: 07/06/2013 MRN: [id number] DOB: 05/25/1933  PCP: [name]., MD   REFERRING PHYSICIAN: [name], MD  REASON FOR REFERRAL: Followup of multicentric stage I left breast cancer.     HISTORY OF PRESENT ILLNESS:Angelica [name] is a 80 y.o. female who is referred for followup of multicentric stage I left breast cancer, status post left total mastectomy with sentinel node sampling on 10/11/ 2011 at which time 0.4 cm and 0.2 cm tumors were found, both triple negative. She received no postoperative therapy. She continues to live with her husband of 72 years. He is retired from [employer]. Appetite is good with no nausea, vomiting, diarrhea, or constipation. She does have occasional bowel pain associated with diverticulitis. She denies any fever, night sweats, vaginal bleeding, dysuria, hematuria, incontinence, sore throat, seasonal allergies, cough, wheezing, PND, orthopnea, palpitations, headache, or seizures. She does practice self breast examination. She does have chronic left upper extremity lymphedema and has a sleeve at home. She also does exercises to limit swelling. She is transferring her care from [facility].   PAST MEDICAL HISTORY:  has a past medical history of HTN (hypertension); Hypothyroid; Hyperlipidemia; Anemia; Gout; Colon polyps; Breast cancer (2011); Arthritis; Chronic bronchitis; Diverticulosis of colon; Hemorrhoids; MI (myocardial infarction) (1973); and Coronary artery disease, non-occlusive (2006).     PAST SURGICAL HISTORY: Past Surgical History  Procedure Laterality Date  . Abdominal hysterectomy      TAH  . Cholecystectomy    . Hemorrhoid surgery    . Colonoscopy       ?3 years ago. Per pt, diverticulosis, hemorrhoids  . Colonoscopy  04/06/2011    Procedure: COLONOSCOPY;  Surgeon: Dorothyann Peng, MD;  [facility];  Service: Endoscopy;  Laterality: N/A;  9:30  . Mastectomy  2011    left  . Cataract extraction w/phaco  07/02/2011    Procedure: CATARACT EXTRACTION PHACO AND INTRAOCULAR LENS PLACEMENT (IOC);  Surgeon: Williams Che, MD;  Location: [facility];  Service: Ophthalmology;  Laterality: Left;  CDE:3.45  . Cataract extraction w/phaco  04/17/2012    Procedure: CATARACT EXTRACTION PHACO AND INTRAOCULAR LENS PLACEMENT (IOC);  Surgeon: Tonny Branch, MD;  Location: [facility];  Service: Ophthalmology;  Laterality: Right;  CDE:13.40  . Cardiac catheterization  2006    Roughly 40% RCA lesion, otherwise normal.  . Nm myoview ltd  June 2013    No ischemia or infarction. Normal EF  . Transthoracic echocardiogram  June 2013    Normal EF, no significant valve disease.  . Laparoscopic lysis of adhesions  2008    Dr. Zella Richer with repair of incisional hernia noted -      CURRENT MEDICATIONS: has a current medication list which includes the following prescription(s): allopurinol, amlodipine, aspirin ec, atorvastatin, enalapril, furosemide, levothyroxine, metoprolol succinate, multivitamins ther. w/minerals, pantoprazole, potassium chloride sa, and propylene glycol.   ALLERGIES: Bee venom; Ciprofloxacin; Clindamycin hcl; Flagyl; Shellfish-derived products; and Sulfa antibiotics   SOCIAL HISTORY:  reports that she has never smoked. She does not have any smokeless tobacco history on file. She reports that she does not drink alcohol or use illicit drugs.   FAMILY HISTORY: family history includes Breast cancer in her sister; Prostate cancer in her father. There is no history of Colon cancer, Anesthesia problems, Hypotension, Malignant hyperthermia, or Pseudochol deficiency.    REVIEW OF  SYSTEMS:  Other than that discussed above is noncontributory.    PHYSICAL EXAM:  vitals were not taken for this visit.   GENERAL:alert, no distress and comfortable SKIN: skin color, texture, turgor are normal, no rashes  or significant lesions EYES: normal, Conjunctiva are pink and non-injected, sclera clear OROPHARYNX:no exudate, no erythema and lips, buccal mucosa, and tongue normal  NECK: supple, thyroid normal size, non-tender, without nodularity CHEST: Status post left mastectomy with keloid formation in the surgical scar. Right breast without mass. LYMPH:  no palpable lymphadenopathy in the cervical, axillary or inguinal LUNGS: clear to auscultation and percussion with normal breathing effort HEART: regular rate & rhythm and no murmurs ABDOMEN:abdomen soft, non-tender and normal bowel sounds. Abdominal surgical wounds well healed. MUSCULOSKELETALl:no cyanosis of digits, no clubbing or edema . Left upper extremity +1 lymphedema. NEURO: alert & oriented x 3 with fluent speech, no focal motor/sensory deficits    LABORATORY DATA:  No visits with results within 30 Day(s) from this visit. Latest known visit with results is:  Hospital Outpatient Visit on 04/10/2012  Component Date Value Range Status  . Sodium 04/10/2012 138  135 - 145 mEq/L Final  . Potassium 04/10/2012 4.4  3.5 - 5.1 mEq/L Final  . Chloride 04/10/2012 96  96 - 112 mEq/L Final  . CO2 04/10/2012 34* 19 - 32 mEq/L Final  . Glucose, Bld 04/10/2012 89  70 - 99 mg/dL Final  . BUN 04/10/2012 24* 6 - 23 mg/dL Final  . Creatinine, Ser 04/10/2012 0.97  0.50 - 1.10 mg/dL Final  . Calcium 04/10/2012 10.2  8.4 - 10.5 mg/dL Final  . GFR calc non Af Amer 04/10/2012 55* >90 mL/min Final  . GFR calc Af Amer 04/10/2012 64* >90 mL/min Final   Comment:                                 The eGFR has been calculated                          using the CKD EPI equation.                          This calculation has not been                          validated in all clinical                          situations.                          eGFR's persistently                          <90 mL/min signify                          possible Chronic Kidney Disease.    Marland Kitchen Hemoglobin 04/10/2012 13.4  12.0 - 15.0 g/dL Final  . HCT 04/10/2012 41.9  36.0 - 46.0 % Final    Urinalysis No results found for this basename: colorurine, appearanceur, labspec, phurine, glucoseu, hgbur, bilirubinur, ketonesur, proteinur, urobilinogen, nitrite, leukocytesur      _0 :  10/15/2012: Right mammogram negative  05/27/2013: Bone mineral  density showed osteopenia.  MR Brain W Wo Contrast 03/30/2013  Status: Final result         PACS Images    Show images for MR Brain W Wo Contrast         Study Result    CLINICAL DATA: Progressive right-sided hearing loss. Loss of  balance.  BUN and creatinine were obtained on site at [facility] at  [address]  Results: BUN 11 mg/dL, Creatinine 0.8 mg/dL.  EXAM:  MRI HEAD WITHOUT AND WITH CONTRAST  TECHNIQUE:  Multiplanar, multiecho pulse sequences of the brain and surrounding  structures were obtained according to standard protocol without and  with intravenous contrast  CONTRAST: 35m MULTIHANCE GADOBENATE DIMEGLUMINE 529 MG/ML IV SOLN  COMPARISON: Report of MRI brain 07/15/2001 performed for  right-sided hearing loss.  FINDINGS:  The diffusion-weighted images demonstrate no evidence for acute or  subacute infarction. Midline structures are within normal limits.  Extensive periventricular and subcortical T2 hyperintensities are  advanced for age. No hemorrhage or mass lesion is present.  Dedicated imaging of the internal auditory canals demonstrates no  pathologic enhancement or mass lesion. The 7th and 8th cranial  nerves are visualized and within normal limits bilaterally. The AICA  vessels extend over the 7th and 8th cranial nerves bilaterally at  the porous acusticus.  Postcontrast images through the entire brain demonstrate no  pathologic enhancement.  Flow is present in the major intracranial arteries. The globes and  orbits are intact. The paranasal sinuses and mastoid air  cells are  clear.  IMPRESSION:  1. Extensive periventricular and subcortical T2 hyperintensities are  advanced for age. The finding is nonspecific but can be seen in the  setting of chronic microvascular ischemia, a demyelinating process  such as multiple sclerosis, vasculitis, complicated migraine  headaches, or as the sequelae of a prior infectious or inflammatory  process.  2. The AICA extends over the 7th and 8th cranial nerves bilaterally  at the level of the porous acusticus.  Electronically Signed  By: CLawrence SantiagoM.D.  On: 03/30/2013 20    PATHOLOGY: Triple negative duct cell carcinoma, 0.4 and 0.2 cm in size resected on 03/22/2010.   IMPRESSION:  #1. Multicentric triple-negative left breast cancer, 0.2 and 0.4 cm in size, status post left mastectomy with sentinel node sampling, no evidence of disease pending today's lab reports. #2. Hypertension, controlled. #3. Hypothyroidism, on treatment. #4. Diverticulosis, stable. #5. Hypertension, controlled. #6. History of cardiac arrest in 1970 at the time of thyroid surgery. #7. Status post cholecystectomy #8. History of gout.    PLAN:  #1. Continue watchful expectation with monthly self breast examination. #2. [facility] in may of 2015. #3. Followup in 6 months with lab tests including vitamin D level.     FDoroteo Bradford MD 07/06/2013 3:42 PM

## 2013-07-07 LAB — CEA: CEA: 1.7 ng/mL (ref 0.0–5.0)

## 2013-07-07 LAB — CANCER ANTIGEN 27.29: CA 27.29: 26 U/mL (ref 0–39)

## 2013-07-07 LAB — VITAMIN D 25 HYDROXY (VIT D DEFICIENCY, FRACTURES): Vit D, 25-Hydroxy: 11 ng/mL — ABNORMAL LOW (ref 30–89)

## 2013-10-20 DIAGNOSIS — Z1231 Encounter for screening mammogram for malignant neoplasm of breast: Secondary | ICD-10-CM | POA: Diagnosis not present

## 2013-10-20 DIAGNOSIS — Z9010 Acquired absence of unspecified breast and nipple: Secondary | ICD-10-CM | POA: Diagnosis not present

## 2013-10-31 ENCOUNTER — Encounter (HOSPITAL_COMMUNITY): Payer: Self-pay | Admitting: Emergency Medicine

## 2013-10-31 ENCOUNTER — Emergency Department (HOSPITAL_COMMUNITY)
Admission: EM | Admit: 2013-10-31 | Discharge: 2013-10-31 | Disposition: A | Discharge status: Home / Self Care | Payer: Medicare Other | Attending: Emergency Medicine | Admitting: Emergency Medicine

## 2013-10-31 DIAGNOSIS — I2510 Atherosclerotic heart disease of native coronary artery without angina pectoris: Secondary | ICD-10-CM | POA: Diagnosis not present

## 2013-10-31 DIAGNOSIS — IMO0002 Reserved for concepts with insufficient information to code with codable children: Secondary | ICD-10-CM | POA: Insufficient documentation

## 2013-10-31 DIAGNOSIS — Z862 Personal history of diseases of the blood and blood-forming organs and certain disorders involving the immune mechanism: Secondary | ICD-10-CM | POA: Insufficient documentation

## 2013-10-31 DIAGNOSIS — Z9889 Other specified postprocedural states: Secondary | ICD-10-CM | POA: Diagnosis not present

## 2013-10-31 DIAGNOSIS — Z7982 Long term (current) use of aspirin: Secondary | ICD-10-CM | POA: Diagnosis not present

## 2013-10-31 DIAGNOSIS — E039 Hypothyroidism, unspecified: Secondary | ICD-10-CM | POA: Insufficient documentation

## 2013-10-31 DIAGNOSIS — I252 Old myocardial infarction: Secondary | ICD-10-CM | POA: Insufficient documentation

## 2013-10-31 DIAGNOSIS — Z860100 Personal history of colon polyps, unspecified: Secondary | ICD-10-CM | POA: Insufficient documentation

## 2013-10-31 DIAGNOSIS — E785 Hyperlipidemia, unspecified: Secondary | ICD-10-CM | POA: Diagnosis not present

## 2013-10-31 DIAGNOSIS — T162XXA Foreign body in left ear, initial encounter: Secondary | ICD-10-CM

## 2013-10-31 DIAGNOSIS — M129 Arthropathy, unspecified: Secondary | ICD-10-CM | POA: Diagnosis not present

## 2013-10-31 DIAGNOSIS — Z853 Personal history of malignant neoplasm of breast: Secondary | ICD-10-CM | POA: Insufficient documentation

## 2013-10-31 DIAGNOSIS — Z79899 Other long term (current) drug therapy: Secondary | ICD-10-CM | POA: Insufficient documentation

## 2013-10-31 DIAGNOSIS — I10 Essential (primary) hypertension: Secondary | ICD-10-CM | POA: Insufficient documentation

## 2013-10-31 DIAGNOSIS — T169XXA Foreign body in ear, unspecified ear, initial encounter: Secondary | ICD-10-CM | POA: Insufficient documentation

## 2013-10-31 DIAGNOSIS — Y9389 Activity, other specified: Secondary | ICD-10-CM | POA: Insufficient documentation

## 2013-10-31 DIAGNOSIS — Y929 Unspecified place or not applicable: Secondary | ICD-10-CM | POA: Insufficient documentation

## 2013-10-31 NOTE — ED Notes (Signed)
Pt states a bug flew into her left ear upon stepping out of her house today.

## 2013-10-31 NOTE — Discharge Instructions (Signed)
Ear Foreign Body °An ear foreign body is an object that is stuck in the ear. It is common for young children to put objects into the ear canal. These may include pebbles, beads, beans, and any other small objects which will fit. In adults, objects such as cotton swabs may become lodged in the ear canal. In all ages, the most common foreign bodies are insects that enter the ear canal.  °SYMPTOMS  °Foreign bodies may cause pain, buzzing or roaring sounds, hearing loss, and ear drainage.  °HOME CARE INSTRUCTIONS  °· Keep all follow-up appointments with your caregiver as told. °· Keep small objects out of reach of young children. Tell them not to put anything in their ears. °SEEK IMMEDIATE MEDICAL CARE IF:  °· You have bleeding from the ear. °· You have increased pain or swelling of the ear. °· You have reduced hearing. °· You have discharge coming from the ear. °· You have a fever. °· You have a headache. °MAKE SURE YOU:  °· Understand these instructions. °· Will watch your condition. °· Will get help right away if you are not doing well or get worse. °Document Released: 05/25/2000 Document Revised: 08/20/2011 Document Reviewed: 01/14/2008 °ExitCare® Patient Information ©2014 [employer]. ° °

## 2013-10-31 NOTE — ED Notes (Signed)
Patient with no complaints at this time. Respirations even and unlabored. Skin warm/dry. Discharge instructions reviewed with patient at this time. Patient given opportunity to voice concerns/ask questions. Patient discharged at this time and left Emergency Department with steady gait.   

## 2013-11-02 NOTE — ED Provider Notes (Signed)
CSN: [id number]     Arrival date & time 10/31/13  1215 History   First MD Initiated Contact with Patient 10/31/13 1248     Chief Complaint  Patient presents with  . Foreign Body in [place]     (Consider location/radiation/quality/duration/timing/severity/associated sxs/prior Treatment) Patient is a 80 y.o. female presenting with foreign body in ear. The history is provided by the patient.  Foreign Body in Ear This is a new problem. The current episode started today. The problem has been resolved (she felt ticking and heard buzzing in the ear which is now resolved). Associated symptoms include nausea. Pertinent negatives include no abdominal pain, arthralgias, chest pain, congestion, fever, headaches, joint swelling, neck pain, numbness, rash, sore throat, vertigo or weakness. Associated symptoms comments: She felt nauseated while it was in her ear, but not currently.. Nothing aggravates the symptoms. She has tried nothing for the symptoms.    Past Medical History  Diagnosis Date  . HTN (hypertension)   . Hypothyroid   . Hyperlipidemia   . Anemia   . Gout   . Colon polyps     Previous colonoscopy by Dr. Irving Shows  . Breast cancer 2011    left; no adjuvent therapy, Dr. Sonny Dandy  . Arthritis   . Chronic bronchitis   . Diverticulosis of colon   . Hemorrhoids   . MI (myocardial infarction) 1973    By report.  . Coronary artery disease, non-occlusive 2006    Roughly 40% RCA.; Negative Myoview in June 2013   Past Surgical History  Procedure Laterality Date  . Abdominal hysterectomy      TAH  . Cholecystectomy    . Hemorrhoid surgery    . Colonoscopy       ?3 years ago. Per pt, diverticulosis, hemorrhoids  . Colonoscopy  04/06/2011    Procedure: COLONOSCOPY;  Surgeon: Dorothyann Peng, MD;  [facility];  Service: Endoscopy;  Laterality: N/A;  9:30  . Mastectomy  2011    left  . Cataract extraction w/phaco  07/02/2011    Procedure: CATARACT EXTRACTION PHACO AND INTRAOCULAR  LENS PLACEMENT (IOC);  Surgeon: Williams Che, MD;  Location: [facility];  Service: Ophthalmology;  Laterality: Left;  CDE:3.45  . Cataract extraction w/phaco  04/17/2012    Procedure: CATARACT EXTRACTION PHACO AND INTRAOCULAR LENS PLACEMENT (IOC);  Surgeon: Tonny Branch, MD;  Location: [facility];  Service: Ophthalmology;  Laterality: Right;  CDE:13.40  . Cardiac catheterization  2006    Roughly 40% RCA lesion, otherwise normal.  . Nm myoview ltd  June 2013    No ischemia or infarction. Normal EF  . Transthoracic echocardiogram  June 2013    Normal EF, no significant valve disease.  . Laparoscopic lysis of adhesions  2008    Dr. Zella Richer with repair of incisional hernia noted -    Family History  Problem Relation Age of Onset  . Prostate cancer Father   . Breast cancer Sister   . Colon cancer Neg Hx   . Anesthesia problems Neg Hx   . Hypotension Neg Hx   . Malignant hyperthermia Neg Hx   . Pseudochol deficiency Neg Hx    History  Substance Use Topics  . Smoking status: Never Smoker   . Smokeless tobacco: Never Used  . Alcohol Use: No   OB History   Grav Para Term Preterm Abortions TAB SAB Ect Mult Living                 Review  of Systems  Constitutional: Negative for fever.  HENT: Negative for congestion, ear pain, hearing loss and sore throat.   Eyes: Negative.   Respiratory: Negative for chest tightness and shortness of breath.   Cardiovascular: Negative for chest pain.  Gastrointestinal: Positive for nausea. Negative for abdominal pain.  Genitourinary: Negative.   Musculoskeletal: Negative for arthralgias, joint swelling and neck pain.  Skin: Negative.  Negative for rash and wound.  Neurological: Negative for dizziness, vertigo, weakness, light-headedness, numbness and headaches.  Psychiatric/Behavioral: Negative.       Allergies  Bee venom; Ciprofloxacin; Clindamycin hcl; Flagyl; Shellfish-derived products; and Sulfa antibiotics  Home Medications   Prior to  Admission medications   Medication Sig Start Date End Date Taking? Authorizing Provider  allopurinol (ZYLOPRIM) 300 MG tablet Take 300 mg by mouth daily.      Historical Provider, MD  amLODipine (NORVASC) 2.5 MG tablet Take 2.5 mg by mouth daily.      Historical Provider, MD  aspirin EC 81 MG tablet Take 81 mg by mouth daily.    Historical Provider, MD  atorvastatin (LIPITOR) 20 MG tablet Take 1 tablet (20 mg total) by mouth daily. 07/02/13   Troy Sine, MD  enalapril (VASOTEC) 10 MG tablet Take 10 mg by mouth daily.    Historical Provider, MD  furosemide (LASIX) 20 MG tablet Take 20 mg by mouth daily.     Historical Provider, MD  levothyroxine (SYNTHROID, LEVOTHROID) 50 MCG tablet Take 50 mcg by mouth daily.     Historical Provider, MD  metoprolol succinate (TOPROL-XL) 50 MG 24 hr tablet Take 50 mg by mouth daily. Take with or immediately following a meal.    Historical Provider, MD  Multiple Vitamins-Minerals (MULTIVITAMINS THER. W/MINERALS) TABS Take 1 tablet by mouth daily.      Historical Provider, MD  pantoprazole (PROTONIX) 40 MG tablet Take 1 tablet (40 mg total) by mouth daily. 02/11/13   Rebecca Eaton, MD  potassium chloride SA (K-DUR,KLOR-CON) 20 MEQ tablet Take 20 mEq by mouth 2 (two) times daily.    Historical Provider, MD  Propylene Glycol (SYSTANE BALANCE) 0.6 % SOLN Place 1 drop into both eyes 2 (two) times daily.    Historical Provider, MD   BP 155/84  Pulse 85  Temp(Src) 98.1 F (36.7 C)  Resp 18  Ht 5' (1.524 m)  Wt 172 lb (78.019 kg)  BMI 33.59 kg/m2  SpO2 95% Physical Exam  Constitutional: She is oriented to person, place, and time. She appears well-developed and well-nourished.  HENT:  Head: Normocephalic and atraumatic.  Right Ear: Tympanic membrane and ear canal normal.  Left Ear: Tympanic membrane and ear canal normal.  Nose: Mucosal edema and rhinorrhea present.  Mouth/Throat: Uvula is midline, oropharynx is clear and moist and mucous membranes are  normal. No oropharyngeal exudate, posterior oropharyngeal edema, posterior oropharyngeal erythema or tonsillar abscesses.  No fb seen at this time.  Normal exam.  Eyes: Conjunctivae are normal.  Cardiovascular: Normal rate and normal heart sounds.   Pulmonary/Chest: Effort normal. No respiratory distress.  Abdominal: Soft.  Musculoskeletal: Normal range of motion.  Neurological: She is alert and oriented to person, place, and time.  Skin: Skin is warm and dry.  Psychiatric: She has a normal mood and affect.    ED Course  Procedures (including critical care time) Labs Review Labs Reviewed - No data to display  Imaging Review No results found.   EKG Interpretation None      MDM  Final diagnoses:  Foreign body of ear, left    Prn f/u    Evalee Jefferson, PA-C 11/02/13 1310

## 2013-11-02 NOTE — ED Provider Notes (Signed)
Medical screening examination/treatment/procedure(s) were performed by non-physician practitioner and as supervising physician I was immediately available for consultation/collaboration.   EKG Interpretation None       Orlie Dakin, MD 11/02/13 1725

## 2013-12-09 HISTORY — PX: TRANSTHORACIC ECHOCARDIOGRAM: SHX275

## 2013-12-22 ENCOUNTER — Encounter (HOSPITAL_BASED_OUTPATIENT_CLINIC_OR_DEPARTMENT_OTHER): Payer: Medicare Other

## 2013-12-22 ENCOUNTER — Encounter (HOSPITAL_COMMUNITY): Payer: Self-pay

## 2013-12-22 ENCOUNTER — Encounter (HOSPITAL_COMMUNITY): Payer: Medicare Other | Attending: Hematology and Oncology

## 2013-12-22 VITALS — BP 141/72 | HR 70 | Temp 97.7°F | Resp 16 | Wt 175.9 lb

## 2013-12-22 DIAGNOSIS — E039 Hypothyroidism, unspecified: Secondary | ICD-10-CM | POA: Insufficient documentation

## 2013-12-22 DIAGNOSIS — Z8639 Personal history of other endocrine, nutritional and metabolic disease: Secondary | ICD-10-CM | POA: Insufficient documentation

## 2013-12-22 DIAGNOSIS — Z8674 Personal history of sudden cardiac arrest: Secondary | ICD-10-CM | POA: Diagnosis not present

## 2013-12-22 DIAGNOSIS — C50912 Malignant neoplasm of unspecified site of left female breast: Secondary | ICD-10-CM

## 2013-12-22 DIAGNOSIS — C50919 Malignant neoplasm of unspecified site of unspecified female breast: Secondary | ICD-10-CM

## 2013-12-22 DIAGNOSIS — E559 Vitamin D deficiency, unspecified: Secondary | ICD-10-CM

## 2013-12-22 DIAGNOSIS — I890 Lymphedema, not elsewhere classified: Secondary | ICD-10-CM | POA: Diagnosis not present

## 2013-12-22 DIAGNOSIS — Z79899 Other long term (current) drug therapy: Secondary | ICD-10-CM | POA: Diagnosis not present

## 2013-12-22 DIAGNOSIS — Z9010 Acquired absence of unspecified breast and nipple: Secondary | ICD-10-CM | POA: Insufficient documentation

## 2013-12-22 DIAGNOSIS — E785 Hyperlipidemia, unspecified: Secondary | ICD-10-CM | POA: Insufficient documentation

## 2013-12-22 DIAGNOSIS — I2510 Atherosclerotic heart disease of native coronary artery without angina pectoris: Secondary | ICD-10-CM | POA: Insufficient documentation

## 2013-12-22 DIAGNOSIS — Z862 Personal history of diseases of the blood and blood-forming organs and certain disorders involving the immune mechanism: Secondary | ICD-10-CM | POA: Insufficient documentation

## 2013-12-22 DIAGNOSIS — I10 Essential (primary) hypertension: Secondary | ICD-10-CM | POA: Diagnosis not present

## 2013-12-22 DIAGNOSIS — K5730 Diverticulosis of large intestine without perforation or abscess without bleeding: Secondary | ICD-10-CM | POA: Diagnosis not present

## 2013-12-22 DIAGNOSIS — Z853 Personal history of malignant neoplasm of breast: Secondary | ICD-10-CM | POA: Diagnosis not present

## 2013-12-22 LAB — COMPREHENSIVE METABOLIC PANEL
ALK PHOS: 75 U/L (ref 39–117)
ALT: 14 U/L (ref 0–35)
AST: 18 U/L (ref 0–37)
Albumin: 4 g/dL (ref 3.5–5.2)
Anion gap: 12 (ref 5–15)
BUN: 15 mg/dL (ref 6–23)
CHLORIDE: 100 meq/L (ref 96–112)
CO2: 29 meq/L (ref 19–32)
Calcium: 9.3 mg/dL (ref 8.4–10.5)
Creatinine, Ser: 0.87 mg/dL (ref 0.50–1.10)
GFR, EST AFRICAN AMERICAN: 72 mL/min — AB (ref >90)
GFR, EST NON AFRICAN AMERICAN: 62 mL/min — AB (ref >90)
GLUCOSE: 99 mg/dL (ref 70–99)
POTASSIUM: 4.1 meq/L (ref 3.7–5.3)
SODIUM: 141 meq/L (ref 137–147)
Total Bilirubin: 1.2 mg/dL (ref 0.3–1.2)
Total Protein: 7.7 g/dL (ref 6.0–8.3)

## 2013-12-22 LAB — CBC WITH DIFFERENTIAL/PLATELET
Basophils Absolute: 0.1 10*3/uL (ref 0.0–0.1)
Basophils Relative: 1 % (ref 0–1)
Eosinophils Absolute: 0.1 10*3/uL (ref 0.0–0.7)
Eosinophils Relative: 2 % (ref 0–5)
HCT: 39.7 % (ref 36.0–46.0)
Hemoglobin: 13 g/dL (ref 12.0–15.0)
LYMPHS ABS: 2.4 10*3/uL (ref 0.7–4.0)
LYMPHS PCT: 35 % (ref 12–46)
MCH: 31.3 pg (ref 26.0–34.0)
MCHC: 32.7 g/dL (ref 30.0–36.0)
MCV: 95.4 fL (ref 78.0–100.0)
Monocytes Absolute: 0.3 10*3/uL (ref 0.1–1.0)
Monocytes Relative: 5 % (ref 3–12)
NEUTROS ABS: 4 10*3/uL (ref 1.7–7.7)
NEUTROS PCT: 57 % (ref 43–77)
PLATELETS: 165 10*3/uL (ref 150–400)
RBC: 4.16 MIL/uL (ref 3.87–5.11)
RDW: 14.4 % (ref 11.5–15.5)
WBC: 6.9 10*3/uL (ref 4.0–10.5)

## 2013-12-22 NOTE — Progress Notes (Signed)
[facility]  OFFICE PROGRESS NOTE  [name]., MD [place] [address]  DIAGNOSIS: Breast cancer, left - Plan: CBC with Differential, Comprehensive metabolic panel, CEA, Cancer antigen 27.29  Vitamin D deficiency  Chief Complaint  Patient presents with  . Triple negative left breast cancer stage I    CURRENT THERAPY: Watchful expectation for multicentric stage I left breast cancer, triple negative.  INTERVAL HISTORY: Angelica [name] 80 y.o. female returns for followup of multicentric stage I left breast cancer, status post left total mastectomy with sentinel node sampling on 10/11/ 2011 at which time 0.4 cm and 0.2 cm tumors were found, both triple negative. She received no postoperative therapy. She continues to do therapy for left upper extremity lymphedema wearing a sleeve. Appetite is good with no nausea, vomiting, diarrhea, constipation, melena, hematochezia, or incontinence. She denies any hematuria, vaginal bleeding, epistaxis, hemoptysis, but did have an episode of bronchitis in January of 2015. She denies any fever, night sweats, abdominal pain, skin rash, headache, or seizures.    MEDICAL HISTORY: Past Medical History  Diagnosis Date  . HTN (hypertension)   . Hypothyroid   . Hyperlipidemia   . Anemia   . Gout   . Colon polyps     Previous colonoscopy by Dr. Irving Shows  . Breast cancer 2011    left; no adjuvent therapy, Dr. Sonny Dandy  . Arthritis   . Chronic bronchitis   . Diverticulosis of colon   . Hemorrhoids   . MI (myocardial infarction) 1973    By report.  . Coronary artery disease, non-occlusive 2006    Roughly 40% RCA.; Negative Myoview in June 2013    INTERIM HISTORY: has Diverticulosis of colon; Diverticulitis of colon; Breast cancer; Personal history of colonic polyps; LLQ pain; Rectal bleed; Diarrhea; Chest pain with minimal risk for cardiac etiology; Benign hypertension; Hypokalemia;  Hypothyroidism; Hyperlipidemia; Obesity (BMI 30-39.9); Coronary artery disease, non-occlusive; and Bilateral lower extremity edema on her problem list.    ALLERGIES:  is allergic to bee venom; ciprofloxacin; clindamycin hcl; flagyl; shellfish-derived products; and sulfa antibiotics.  MEDICATIONS: has a current medication list which includes the following prescription(s): allopurinol, amlodipine, aspirin ec, atorvastatin, enalapril, furosemide, levothyroxine, metoprolol succinate, multivitamins ther. w/minerals, pantoprazole, potassium chloride sa, and propylene glycol.  SURGICAL HISTORY:  Past Surgical History  Procedure Laterality Date  . Abdominal hysterectomy      TAH  . Cholecystectomy    . Hemorrhoid surgery    . Colonoscopy       ?3 years ago. Per pt, diverticulosis, hemorrhoids  . Colonoscopy  04/06/2011    Procedure: COLONOSCOPY;  Surgeon: Dorothyann Peng, MD;  Location: [facility];  Service: Endoscopy;  Laterality: N/A;  9:30  . Mastectomy  2011    left  . Cataract extraction w/phaco  07/02/2011    Procedure: CATARACT EXTRACTION PHACO AND INTRAOCULAR LENS PLACEMENT (IOC);  Surgeon: Williams Che, MD;  Location: [facility] ORS;  Service: Ophthalmology;  Laterality: Left;  CDE:3.45  . Cataract extraction w/phaco  04/17/2012    Procedure: CATARACT EXTRACTION PHACO AND INTRAOCULAR LENS PLACEMENT (IOC);  Surgeon: Tonny Branch, MD;  Location: [facility];  Service: Ophthalmology;  Laterality: Right;  CDE:13.40  . Cardiac catheterization  2006    Roughly 40% RCA lesion, otherwise normal.  . Nm myoview ltd  June 2013    No ischemia or infarction. Normal EF  . Transthoracic echocardiogram  June 2013  Normal EF, no significant valve disease.  . Laparoscopic lysis of adhesions  2008    Dr. Zella Richer with repair of incisional hernia noted -     FAMILY HISTORY: family history includes Breast cancer in her sister; Prostate cancer in her father. There is no history of Colon cancer, Anesthesia  problems, Hypotension, Malignant hyperthermia, or Pseudochol deficiency.  SOCIAL HISTORY:  reports that she has never smoked. She has never used smokeless tobacco. She reports that she does not drink alcohol or use illicit drugs.  REVIEW OF SYSTEMS:  Other than that discussed above is noncontributory.  PHYSICAL EXAMINATION: ECOG PERFORMANCE STATUS: 1 - Symptomatic but completely ambulatory  Blood pressure 141/72, pulse 70, temperature 97.7 F (36.5 C), temperature source Oral, resp. rate 16, weight 175 lb 14.4 oz (79.788 kg).  GENERAL:alert, no distress and comfortable. Moderately obese. SKIN: skin color, texture, turgor are normal, no rashes or significant lesions EYES: PERLA; Conjunctiva are pink and non-injected, sclera clear SINUSES: No redness or tenderness over maxillary or ethmoid sinuses OROPHARYNX:no exudate, no erythema on lips, buccal mucosa, or tongue. NECK: supple, thyroid normal size, non-tender, without nodularity. No masses CHEST: Status post left mastectomy with small keloid formation along the incision site. No subcutaneous nodules. Right breast without mass. LYMPH:  no palpable lymphadenopathy in the cervical, axillary or inguinal LUNGS: clear to auscultation and percussion with normal breathing effort HEART: regular rate & rhythm and no murmurs. ABDOMEN:abdomen soft, non-tender and normal bowel sounds MUSCULOSKELETAL:no cyanosis of digits and no clubbing. Range of motion normal. Nonpitting edema both lower extremities. Minimal left upper extremity lymphedema.  NEURO: alert & oriented x 3 with fluent speech, no focal motor/sensory deficits   LABORATORY DATA: Lab on 12/22/2013  Component Date Value Ref Range Status  . WBC 12/22/2013 6.9  4.0 - 10.5 K/uL Final  . RBC 12/22/2013 4.16  3.87 - 5.11 MIL/uL Final  . Hemoglobin 12/22/2013 13.0  12.0 - 15.0 g/dL Final  . HCT 12/22/2013 39.7  36.0 - 46.0 % Final  . MCV 12/22/2013 95.4  78.0 - 100.0 fL Final  . MCH  12/22/2013 31.3  26.0 - 34.0 pg Final  . MCHC 12/22/2013 32.7  30.0 - 36.0 g/dL Final  . RDW 12/22/2013 14.4  11.5 - 15.5 % Final  . Platelets 12/22/2013 165  150 - 400 K/uL Final  . Neutrophils Relative % 12/22/2013 57  43 - 77 % Final  . Neutro Abs 12/22/2013 4.0  1.7 - 7.7 K/uL Final  . Lymphocytes Relative 12/22/2013 35  12 - 46 % Final  . Lymphs Abs 12/22/2013 2.4  0.7 - 4.0 K/uL Final  . Monocytes Relative 12/22/2013 5  3 - 12 % Final  . Monocytes Absolute 12/22/2013 0.3  0.1 - 1.0 K/uL Final  . Eosinophils Relative 12/22/2013 2  0 - 5 % Final  . Eosinophils Absolute 12/22/2013 0.1  0.0 - 0.7 K/uL Final  . Basophils Relative 12/22/2013 1  0 - 1 % Final  . Basophils Absolute 12/22/2013 0.1  0.0 - 0.1 K/uL Final  . Sodium 12/22/2013 141  137 - 147 mEq/L Final  . Potassium 12/22/2013 4.1  3.7 - 5.3 mEq/L Final  . Chloride 12/22/2013 100  96 - 112 mEq/L Final  . CO2 12/22/2013 29  19 - 32 mEq/L Final  . Glucose, Bld 12/22/2013 99  70 - 99 mg/dL Final  . BUN 12/22/2013 15  6 - 23 mg/dL Final  . Creatinine, Ser 12/22/2013 0.87  0.50 - 1.10 mg/dL  Final  . Calcium 12/22/2013 9.3  8.4 - 10.5 mg/dL Final  . Total Protein 12/22/2013 7.7  6.0 - 8.3 g/dL Final  . Albumin 12/22/2013 4.0  3.5 - 5.2 g/dL Final  . AST 12/22/2013 18  0 - 37 U/L Final  . ALT 12/22/2013 14  0 - 35 U/L Final  . Alkaline Phosphatase 12/22/2013 75  39 - 117 U/L Final  . Total Bilirubin 12/22/2013 1.2  0.3 - 1.2 mg/dL Final  . GFR calc non Af Amer 12/22/2013 62* >90 mL/min Final  . GFR calc Af Amer 12/22/2013 72* >90 mL/min Final   Comment: (NOTE)                          The eGFR has been calculated using the CKD EPI equation.                          This calculation has not been validated in all clinical situations.                          eGFR's persistently <90 mL/min signify possible Chronic Kidney                          Disease.  . Anion gap 12/22/2013 12  5 - 15 Final    PATHOLOGY: No new  pathology  Urinalysis No results found for this basename: colorurine,  appearanceur,  labspec,  phurine,  glucoseu,  hgbur,  bilirubinur,  ketonesur,  proteinur,  urobilinogen,  nitrite,  leukocytesur    RADIOGRAPHIC STUDIES:   ASSESSMENT:  #1. Multicentric triple-negative left breast cancer, 0.2 and 0.4 cm in size, status post left mastectomy with sentinel node sampling, no evidence of disease pending today's lab reports.  #2. Hypertension, controlled.  #3. Hypothyroidism, on treatment.  #4. Diverticulosis, stable.  #5. Hypertension, controlled.  #6. History of cardiac arrest in 1970 at the time of thyroid surgery.  #7. Status post cholecystectomy  #8. History of gout    PLAN:  #1. Continue watchful expectation with monthly self breast examination.  #2. Followup in 6 months with CBC, chem profile, CEA, and CA 27-29.    All questions were answered. The patient knows to call the clinic with any problems, questions or concerns. We can certainly see the patient much sooner if necessary.   I spent 25 minutes counseling the patient face to face. The total time spent in the appointment was 30 minutes.    Doroteo Bradford, MD 12/22/2013 1:08 PM  DISCLAIMER:  This note was dictated with voice recognition software.  Similar sounding words can inadvertently be transcribed inaccurately and may not be corrected upon review.

## 2013-12-22 NOTE — Patient Instructions (Signed)
[facility] Discharge Instructions  RECOMMENDATIONS MADE BY THE CONSULTANT AND ANY TEST RESULTS WILL BE SENT TO YOUR REFERRING PHYSICIAN.  EXAM FINDINGS BY THE PHYSICIAN TODAY AND SIGNS OR SYMPTOMS TO REPORT TO CLINIC OR PRIMARY PHYSICIAN: Exam and findings as discussed by Dr. Barnet Glasgow.  You are doing well.  If there are any concerns with your labs we will let you know.  Report any new lumps, bone pain, shortness of breath or other symptoms.     INSTRUCTIONS/FOLLOW-UP: Follow-up in 6 months with labs and office visit.  Thank you for choosing [facility] to provide your oncology and hematology care.  To afford each patient quality time with our providers, please arrive at least 15 minutes before your scheduled appointment time.  With your help, our goal is to use those 15 minutes to complete the necessary work-up to ensure our physicians have the information they need to help with your evaluation and healthcare recommendations.    Effective January 1st, 2014, we ask that you re-schedule your appointment with our physicians should you arrive 10 or more minutes late for your appointment.  We strive to give you quality time with our providers, and arriving late affects you and other patients whose appointments are after yours.    Again, thank you for choosing [facility].  Our hope is that these requests will decrease the amount of time that you wait before being seen by our physicians.       _____________________________________________________________  Should you have questions after your visit to [facility], please contact our office at [phone] between the hours of 8:30 a.m. and 4:30 p.m.  Voicemails left after 4:30 p.m. will not be returned until the following business day.  For prescription refill requests, have your pharmacy contact our office with your prescription refill request.     _______________________________________________________________  We hope that we have given you very good care.  You may receive a patient satisfaction survey in the mail, please complete it and return it as soon as possible.  We value your feedback!  _______________________________________________________________  Have you asked about our STAR program?  STAR stands for Survivorship Training and Rehabilitation, and this is a nationally recognized cancer care program that focuses on survivorship and rehabilitation.  Cancer and cancer treatments may cause problems, such as, pain, making you feel tired and keeping you from doing the things that you need or want to do. Cancer rehabilitation can help. Our goal is to reduce these troubling effects and help you have the best quality of life possible.  You may receive a survey from a nurse that asks questions about your current state of health.  Based on the survey results, all eligible patients will be referred to the [facility] program for an evaluation so we can better serve you!  A frequently asked questions sheet is available upon request.

## 2013-12-22 NOTE — Progress Notes (Signed)
[place], [place], [id number].

## 2013-12-23 LAB — CEA: CEA: 1.5 ng/mL (ref 0.0–5.0)

## 2013-12-23 LAB — CANCER ANTIGEN 27.29: CA 27.29: 23 U/mL (ref 0–39)

## 2013-12-27 ENCOUNTER — Encounter (HOSPITAL_COMMUNITY): Payer: Self-pay | Admitting: Emergency Medicine

## 2013-12-27 ENCOUNTER — Observation Stay (HOSPITAL_COMMUNITY)
Admission: EM | Admit: 2013-12-27 | Discharge: 2013-12-28 | DRG: 303 | Disposition: A | Discharge status: Home / Self Care | Payer: Medicare Other | Attending: Internal Medicine | Admitting: Internal Medicine

## 2013-12-27 ENCOUNTER — Emergency Department (HOSPITAL_COMMUNITY): Payer: Medicare Other

## 2013-12-27 DIAGNOSIS — I10 Essential (primary) hypertension: Secondary | ICD-10-CM | POA: Diagnosis not present

## 2013-12-27 DIAGNOSIS — R1032 Left lower quadrant pain: Secondary | ICD-10-CM

## 2013-12-27 DIAGNOSIS — E039 Hypothyroidism, unspecified: Secondary | ICD-10-CM | POA: Diagnosis not present

## 2013-12-27 DIAGNOSIS — R197 Diarrhea, unspecified: Secondary | ICD-10-CM

## 2013-12-27 DIAGNOSIS — Z882 Allergy status to sulfonamides status: Secondary | ICD-10-CM

## 2013-12-27 DIAGNOSIS — M1610 Unilateral primary osteoarthritis, unspecified hip: Secondary | ICD-10-CM | POA: Diagnosis present

## 2013-12-27 DIAGNOSIS — Z853 Personal history of malignant neoplasm of breast: Secondary | ICD-10-CM

## 2013-12-27 DIAGNOSIS — M1710 Unilateral primary osteoarthritis, unspecified knee: Secondary | ICD-10-CM | POA: Diagnosis present

## 2013-12-27 DIAGNOSIS — I2510 Atherosclerotic heart disease of native coronary artery without angina pectoris: Principal | ICD-10-CM

## 2013-12-27 DIAGNOSIS — I249 Acute ischemic heart disease, unspecified: Secondary | ICD-10-CM

## 2013-12-27 DIAGNOSIS — M109 Gout, unspecified: Secondary | ICD-10-CM | POA: Diagnosis present

## 2013-12-27 DIAGNOSIS — Z860100 Personal history of colon polyps, unspecified: Secondary | ICD-10-CM

## 2013-12-27 DIAGNOSIS — E785 Hyperlipidemia, unspecified: Secondary | ICD-10-CM | POA: Diagnosis not present

## 2013-12-27 DIAGNOSIS — IMO0002 Reserved for concepts with insufficient information to code with codable children: Secondary | ICD-10-CM

## 2013-12-27 DIAGNOSIS — I252 Old myocardial infarction: Secondary | ICD-10-CM

## 2013-12-27 DIAGNOSIS — I200 Unstable angina: Secondary | ICD-10-CM

## 2013-12-27 DIAGNOSIS — Z881 Allergy status to other antibiotic agents status: Secondary | ICD-10-CM | POA: Diagnosis not present

## 2013-12-27 DIAGNOSIS — Z9010 Acquired absence of unspecified breast and nipple: Secondary | ICD-10-CM | POA: Diagnosis not present

## 2013-12-27 DIAGNOSIS — Z91038 Other insect allergy status: Secondary | ICD-10-CM

## 2013-12-27 DIAGNOSIS — Z803 Family history of malignant neoplasm of breast: Secondary | ICD-10-CM

## 2013-12-27 DIAGNOSIS — Z91013 Allergy to seafood: Secondary | ICD-10-CM

## 2013-12-27 DIAGNOSIS — K5732 Diverticulitis of large intestine without perforation or abscess without bleeding: Secondary | ICD-10-CM

## 2013-12-27 DIAGNOSIS — I119 Hypertensive heart disease without heart failure: Secondary | ICD-10-CM | POA: Diagnosis present

## 2013-12-27 DIAGNOSIS — R079 Chest pain, unspecified: Secondary | ICD-10-CM | POA: Diagnosis present

## 2013-12-27 DIAGNOSIS — K625 Hemorrhage of anus and rectum: Secondary | ICD-10-CM

## 2013-12-27 DIAGNOSIS — R609 Edema, unspecified: Secondary | ICD-10-CM

## 2013-12-27 DIAGNOSIS — J9819 Other pulmonary collapse: Secondary | ICD-10-CM | POA: Diagnosis not present

## 2013-12-27 DIAGNOSIS — E876 Hypokalemia: Secondary | ICD-10-CM | POA: Diagnosis not present

## 2013-12-27 DIAGNOSIS — E669 Obesity, unspecified: Secondary | ICD-10-CM

## 2013-12-27 DIAGNOSIS — R600 Localized edema: Secondary | ICD-10-CM

## 2013-12-27 LAB — BASIC METABOLIC PANEL
ANION GAP: 14 (ref 5–15)
BUN: 15 mg/dL (ref 6–23)
CALCIUM: 9.2 mg/dL (ref 8.4–10.5)
CO2: 26 mEq/L (ref 19–32)
CREATININE: 0.79 mg/dL (ref 0.50–1.10)
Chloride: 98 mEq/L (ref 96–112)
GFR calc Af Amer: 89 mL/min — ABNORMAL LOW (ref >90)
GFR calc non Af Amer: 77 mL/min — ABNORMAL LOW (ref >90)
Glucose, Bld: 105 mg/dL — ABNORMAL HIGH (ref 70–99)
Potassium: 3.6 mEq/L — ABNORMAL LOW (ref 3.7–5.3)
SODIUM: 138 meq/L (ref 137–147)

## 2013-12-27 LAB — HEPATIC FUNCTION PANEL
ALT: 16 U/L (ref 0–35)
AST: 18 U/L (ref 0–37)
Albumin: 4.1 g/dL (ref 3.5–5.2)
Alkaline Phosphatase: 77 U/L (ref 39–117)
BILIRUBIN DIRECT: 0.2 mg/dL (ref 0.0–0.3)
BILIRUBIN TOTAL: 1.4 mg/dL — AB (ref 0.3–1.2)
Indirect Bilirubin: 1.2 mg/dL — ABNORMAL HIGH (ref 0.3–0.9)
Total Protein: 7.9 g/dL (ref 6.0–8.3)

## 2013-12-27 LAB — CBC WITH DIFFERENTIAL/PLATELET
BASOS PCT: 0 % (ref 0–1)
Basophils Absolute: 0 10*3/uL (ref 0.0–0.1)
Eosinophils Absolute: 0.1 10*3/uL (ref 0.0–0.7)
Eosinophils Relative: 1 % (ref 0–5)
HEMATOCRIT: 40.2 % (ref 36.0–46.0)
Hemoglobin: 13.1 g/dL (ref 12.0–15.0)
Lymphocytes Relative: 19 % (ref 12–46)
Lymphs Abs: 1.4 10*3/uL (ref 0.7–4.0)
MCH: 31 pg (ref 26.0–34.0)
MCHC: 32.6 g/dL (ref 30.0–36.0)
MCV: 95 fL (ref 78.0–100.0)
MONO ABS: 0.7 10*3/uL (ref 0.1–1.0)
Monocytes Relative: 9 % (ref 3–12)
NEUTROS ABS: 5.3 10*3/uL (ref 1.7–7.7)
Neutrophils Relative %: 71 % (ref 43–77)
PLATELETS: 170 10*3/uL (ref 150–400)
RBC: 4.23 MIL/uL (ref 3.87–5.11)
RDW: 14.5 % (ref 11.5–15.5)
WBC: 7.5 10*3/uL (ref 4.0–10.5)

## 2013-12-27 LAB — PRO B NATRIURETIC PEPTIDE: Pro B Natriuretic peptide (BNP): 198.9 pg/mL (ref 0–450)

## 2013-12-27 LAB — TROPONIN I: Troponin I: <0.3 ng/mL (ref <0.30)

## 2013-12-27 LAB — MAGNESIUM: Magnesium: 1.8 mg/dL (ref 1.5–2.5)

## 2013-12-27 LAB — LIPASE, BLOOD: Lipase: 22 U/L (ref 11–59)

## 2013-12-27 MED ORDER — ACETAMINOPHEN 325 MG PO TABS: 650.0000 mg | ORAL_TABLET | Freq: Four times a day (QID) | ORAL | Status: DC | PRN

## 2013-12-27 MED ORDER — AMLODIPINE BESYLATE 5 MG PO TABS
2.5000 mg | ORAL_TABLET | Freq: Every day | ORAL | Status: DC
  Administered 2013-12-28: 2.5 mg via ORAL
  Filled 2013-12-27: qty 1

## 2013-12-27 MED ORDER — SODIUM CHLORIDE 0.9 % IV SOLN: 250.0000 mL | INTRAVENOUS | Status: DC | PRN

## 2013-12-27 MED ORDER — ONDANSETRON HCL 4 MG PO TABS: 4.0000 mg | ORAL_TABLET | Freq: Four times a day (QID) | ORAL | Status: DC | PRN

## 2013-12-27 MED ORDER — PANTOPRAZOLE SODIUM 40 MG PO TBEC
40.0000 mg | DELAYED_RELEASE_TABLET | Freq: Every day | ORAL | Status: DC
  Administered 2013-12-28: 40 mg via ORAL
  Filled 2013-12-27: qty 1

## 2013-12-27 MED ORDER — ENALAPRIL MALEATE 5 MG PO TABS
10.0000 mg | ORAL_TABLET | Freq: Every day | ORAL | Status: DC
  Administered 2013-12-28: 10 mg via ORAL
  Filled 2013-12-27 (×3): qty 1
  Filled 2013-12-27: qty 2

## 2013-12-27 MED ORDER — SODIUM CHLORIDE 0.9 % IV SOLN: INTRAVENOUS | Status: DC

## 2013-12-27 MED ORDER — ATORVASTATIN CALCIUM 20 MG PO TABS
20.0000 mg | ORAL_TABLET | Freq: Every day | ORAL | Status: DC
  Filled 2013-12-27 (×4): qty 1

## 2013-12-27 MED ORDER — ACETAMINOPHEN 650 MG RE SUPP: 650.0000 mg | Freq: Four times a day (QID) | RECTAL | Status: DC | PRN

## 2013-12-27 MED ORDER — SODIUM CHLORIDE 0.9 % IJ SOLN
3.0000 mL | Freq: Two times a day (BID) | INTRAMUSCULAR | Status: DC
Start: 2013-12-27 — End: 2013-12-28
  Administered 2013-12-27 – 2013-12-28 (×2): 3 mL via INTRAVENOUS

## 2013-12-27 MED ORDER — FUROSEMIDE 20 MG PO TABS
20.0000 mg | ORAL_TABLET | Freq: Every day | ORAL | Status: DC
  Administered 2013-12-28: 20 mg via ORAL
  Filled 2013-12-27: qty 1

## 2013-12-27 MED ORDER — POLYVINYL ALCOHOL 1.4 % OP SOLN
1.0000 [drp] | Freq: Two times a day (BID) | OPHTHALMIC | Status: DC
Start: 2013-12-27 — End: 2013-12-28
  Administered 2013-12-27 – 2013-12-28 (×2): 1 [drp] via OPHTHALMIC
  Filled 2013-12-27 (×2): qty 15

## 2013-12-27 MED ORDER — POTASSIUM CHLORIDE CRYS ER 20 MEQ PO TBCR
20.0000 meq | EXTENDED_RELEASE_TABLET | Freq: Every day | ORAL | Status: DC
  Administered 2013-12-28: 20 meq via ORAL
  Filled 2013-12-27: qty 1

## 2013-12-27 MED ORDER — ASPIRIN 81 MG PO CHEW
162.0000 mg | CHEWABLE_TABLET | Freq: Once | ORAL | Status: AC
  Administered 2013-12-27: 162 mg via ORAL
  Filled 2013-12-27: qty 2

## 2013-12-27 MED ORDER — SODIUM CHLORIDE 0.9 % IJ SOLN: 3.0000 mL | INTRAMUSCULAR | Status: DC | PRN

## 2013-12-27 MED ORDER — METOPROLOL SUCCINATE ER 50 MG PO TB24
50.0000 mg | ORAL_TABLET | Freq: Every day | ORAL | Status: DC
  Administered 2013-12-28: 50 mg via ORAL
  Filled 2013-12-27 (×3): qty 1

## 2013-12-27 MED ORDER — ENOXAPARIN SODIUM 80 MG/0.8ML ~~LOC~~ SOLN
80.0000 mg | Freq: Two times a day (BID) | SUBCUTANEOUS | Status: DC
  Administered 2013-12-28: 80 mg via SUBCUTANEOUS
  Filled 2013-12-27: qty 0.8

## 2013-12-27 MED ORDER — ALUM & MAG HYDROXIDE-SIMETH 200-200-20 MG/5ML PO SUSP: 30.0000 mL | Freq: Four times a day (QID) | ORAL | Status: DC | PRN

## 2013-12-27 MED ORDER — LEVOTHYROXINE SODIUM 50 MCG PO TABS
50.0000 ug | ORAL_TABLET | Freq: Every day | ORAL | Status: DC
  Administered 2013-12-28: 50 ug via ORAL
  Filled 2013-12-27 (×3): qty 1

## 2013-12-27 MED ORDER — ALLOPURINOL 300 MG PO TABS
300.0000 mg | ORAL_TABLET | Freq: Every day | ORAL | Status: DC
  Administered 2013-12-28: 300 mg via ORAL
  Filled 2013-12-27 (×4): qty 1

## 2013-12-27 MED ORDER — PROPYLENE GLYCOL 0.6 % OP SOLN: 1.0000 [drp] | Freq: Two times a day (BID) | OPHTHALMIC | Status: DC

## 2013-12-27 MED ORDER — NITROGLYCERIN 0.4 MG SL SUBL
0.4000 mg | SUBLINGUAL_TABLET | SUBLINGUAL | Status: DC | PRN
  Administered 2013-12-27 (×2): 0.4 mg via SUBLINGUAL
  Filled 2013-12-27: qty 1

## 2013-12-27 MED ORDER — ONDANSETRON HCL 4 MG/2ML IJ SOLN: 4.0000 mg | Freq: Four times a day (QID) | INTRAMUSCULAR | Status: DC | PRN

## 2013-12-27 MED ORDER — ONDANSETRON HCL 4 MG/2ML IJ SOLN
4.0000 mg | Freq: Once | INTRAMUSCULAR | Status: AC
  Administered 2013-12-27: 4 mg via INTRAVENOUS
  Filled 2013-12-27: qty 2

## 2013-12-27 MED ORDER — ASPIRIN EC 81 MG PO TBEC
81.0000 mg | DELAYED_RELEASE_TABLET | Freq: Every day | ORAL | Status: DC
  Administered 2013-12-28: 81 mg via ORAL
  Filled 2013-12-27 (×4): qty 1

## 2013-12-27 MED ORDER — POTASSIUM CHLORIDE CRYS ER 20 MEQ PO TBCR: 40.0000 meq | EXTENDED_RELEASE_TABLET | ORAL | Status: DC | PRN

## 2013-12-27 NOTE — ED Notes (Signed)
Dr Zackowski at bedside,  

## 2013-12-27 NOTE — ED Notes (Signed)
Pt given dinner tray, pt and family updated on admission status,

## 2013-12-27 NOTE — ED Notes (Signed)
Pt updated on admission, delay explained to pt, pt continues to deny any pain,

## 2013-12-27 NOTE — Progress Notes (Signed)
ANTICOAGULATION CONSULT NOTE - Initial Consult  Pharmacy Consult for Lovenox Indication: chest pain/ACS  Allergies  Allergen Reactions  . Bee Venom Anaphylaxis  . Ciprofloxacin Nausea And Vomiting  . Clindamycin Hcl     Does not know  . Flagyl [Metronidazole Hcl] Nausea And Vomiting  . Shellfish-Derived Products     Rash developed so patient has steered clear of any since last year  . Sulfa Antibiotics Nausea Only    Patient Measurements: Height: 5' (152.4 cm) Weight: 173 lb (78.472 kg) IBW/kg (Calculated) : 45.5  Vital Signs: Temp: 98.1 F (36.7 C) (07/19 1604) Temp src: Oral (07/19 1604) BP: 126/69 mmHg (07/19 1604) Pulse Rate: 71 (07/19 1604)  Labs:  Recent Labs  12/27/13 1322  HGB 13.1  HCT 40.2  PLT 170  CREATININE 0.79  TROPONINI <0.30    Estimated Creatinine Clearance: 52.8 ml/min (by C-G formula based on Cr of 0.79).   Medical History: Past Medical History  Diagnosis Date  . HTN (hypertension)   . Hypothyroid   . Hyperlipidemia   . Anemia   . Gout   . Colon polyps     Previous colonoscopy by Dr. Irving Shows  . Breast cancer 2011    left; no adjuvent therapy, Dr. Sonny Dandy  . Arthritis   . Chronic bronchitis   . Diverticulosis of colon   . Hemorrhoids   . MI (myocardial infarction) 1973    By report.  . Coronary artery disease, non-occlusive 2006    Roughly 40% RCA.; Negative Myoview in June 2013    Medications:  Scheduled:  . allopurinol  300 mg Oral Daily  . amLODipine  2.5 mg Oral Daily  . aspirin EC  81 mg Oral Daily  . atorvastatin  20 mg Oral Daily  . enalapril  10 mg Oral Daily  . enoxaparin (LOVENOX) injection  80 mg Subcutaneous Q12H  . furosemide  20 mg Oral Daily  . [START ON 12/28/2013] levothyroxine  50 mcg Oral QAC breakfast  . [START ON 12/28/2013] metoprolol succinate  50 mg Oral Daily  . pantoprazole  40 mg Oral Daily  . polyvinyl alcohol  1 drop Both Eyes BID  . potassium chloride SA  20 mEq Oral Daily  . sodium  chloride  3 mL Intravenous Q12H    Assessment: 80 yo F admitted with chest pain.  Initial cardiac enzymes negative.  Cards consult pending.  CBC reviewed. No bleeding noted.  Renal function is at patient's baseline.   Goal of Therapy:  Anti-Xa level 0.6-1 units/ml 4hrs after LMWH dose given Monitor platelets by anticoagulation protocol: Yes   Plan:  Lovenox 80mg  sq q12h Monitor CBC F/U cardiology plan  Biagio Borg 12/27/2013,5:28 PM

## 2013-12-27 NOTE — ED Notes (Signed)
Dr Wynelle Cleveland  (hospitalist) at bedside,

## 2013-12-27 NOTE — ED Notes (Signed)
Pt states she got to hot in church last night. States her chest started hurting and she vomited once today on the way to the hopsital

## 2013-12-27 NOTE — ED Provider Notes (Signed)
CSN: [id number]     Arrival date & time 12/27/13  1248 History  This chart was scribed for Fredia Sorrow, MD by Martinique Peace, ED Scribe. The patient was seen in [place]. The patient's care was started at 1:52 PM.    Chief Complaint  Patient presents with  . Chest Pain      HPI HPI Comments: Angelica [name] is a 80 y.o. female who presents to the Emergency Department complaining of constant chest pain onset 11:30 this morning that started at church but has experienced some brief moments of relief intermittently. Pt states the pain is in her substernal and epigastric region and describes the pain as feeling like pressure and tightness. She reports associated diaphoresis, SOB, nausea, vomiting.  She reports that she did take baby aspirin PTA without relief. Pt rates pain currently as 3/10 but states at it's worst it feels like a 6/10. She denies seeing any blood in her vomit. She further denies any history of similar episodes, stints, pacemakers or heart surgeries. Pt states that she does have a cardiologist that she follows up with regularly. Pt reports history of cardiac arrest in 1979 that occurred while she was having surgery on her thyroid.   Past Medical History  Diagnosis Date  . HTN (hypertension)   . Hypothyroid   . Hyperlipidemia   . Anemia   . Gout   . Colon polyps     Previous colonoscopy by Dr. Irving Shows  . Breast cancer 2011    left; no adjuvent therapy, Dr. Sonny Dandy  . Arthritis   . Chronic bronchitis   . Diverticulosis of colon   . Hemorrhoids   . MI (myocardial infarction) 1973    By report.  . Coronary artery disease, non-occlusive 2006    Roughly 40% RCA.; Negative Myoview in June 2013   Past Surgical History  Procedure Laterality Date  . Abdominal hysterectomy      TAH  . Cholecystectomy    . Hemorrhoid surgery    . Colonoscopy       ?3 years ago. Per pt, diverticulosis, hemorrhoids  . Colonoscopy  04/06/2011    Procedure: COLONOSCOPY;  Surgeon: Dorothyann Peng, MD;  Location: [facility];  Service: Endoscopy;  Laterality: N/A;  9:30  . Mastectomy  2011    left  . Cataract extraction w/phaco  07/02/2011    Procedure: CATARACT EXTRACTION PHACO AND INTRAOCULAR LENS PLACEMENT (IOC);  Surgeon: Williams Che, MD;  Location: [facility];  Service: Ophthalmology;  Laterality: Left;  CDE:3.45  . Cataract extraction w/phaco  04/17/2012    Procedure: CATARACT EXTRACTION PHACO AND INTRAOCULAR LENS PLACEMENT (IOC);  Surgeon: Tonny Branch, MD;  Location: [facility];  Service: Ophthalmology;  Laterality: Right;  CDE:13.40  . Cardiac catheterization  2006    Roughly 40% RCA lesion, otherwise normal.  . Nm myoview ltd  June 2013    No ischemia or infarction. Normal EF  . Transthoracic echocardiogram  June 2013    Normal EF, no significant valve disease.  . Laparoscopic lysis of adhesions  2008    Dr. Zella Richer with repair of incisional hernia noted -    Family History  Problem Relation Age of Onset  . Prostate cancer Father   . Breast cancer Sister   . Colon cancer Neg Hx   . Anesthesia problems Neg Hx   . Hypotension Neg Hx   . Malignant hyperthermia Neg Hx   . Pseudochol deficiency Neg Hx    History  Substance Use Topics  . Smoking status: Never Smoker   . Smokeless tobacco: Never Used  . Alcohol Use: No   OB History   Grav Para Term Preterm Abortions TAB SAB Ect Mult Living                 Review of Systems  Constitutional: Negative for fever.  HENT: Negative for rhinorrhea and sore throat.   Eyes: Negative for visual disturbance.  Respiratory: Positive for chest tightness and shortness of breath. Negative for cough.   Cardiovascular: Positive for chest pain and leg swelling.  Gastrointestinal: Positive for nausea, vomiting and abdominal pain. Negative for diarrhea.  Genitourinary: Negative for dysuria.  Musculoskeletal: Positive for back pain.  Skin: Negative for rash.  Neurological: Negative for headaches.  Hematological: Does not  bruise/bleed easily.  Psychiatric/Behavioral: Negative for confusion.      Allergies  Bee venom; Ciprofloxacin; Clindamycin hcl; Flagyl; Shellfish-derived products; and Sulfa antibiotics  Home Medications   Prior to Admission medications   Medication Sig Start Date End Date Taking? Authorizing Provider  allopurinol (ZYLOPRIM) 300 MG tablet Take 300 mg by mouth daily.     Yes Historical Provider, MD  amLODipine (NORVASC) 2.5 MG tablet Take 2.5 mg by mouth daily.     Yes Historical Provider, MD  aspirin EC 81 MG tablet Take 81 mg by mouth daily.   Yes Historical Provider, MD  atorvastatin (LIPITOR) 20 MG tablet Take 1 tablet (20 mg total) by mouth daily. 07/02/13  Yes Troy Sine, MD  enalapril (VASOTEC) 10 MG tablet Take 10 mg by mouth daily.   Yes Historical Provider, MD  furosemide (LASIX) 20 MG tablet Take 20 mg by mouth daily.    Yes Historical Provider, MD  levothyroxine (SYNTHROID, LEVOTHROID) 50 MCG tablet Take 50 mcg by mouth daily.    Yes Historical Provider, MD  metoprolol succinate (TOPROL-XL) 50 MG 24 hr tablet Take 50 mg by mouth daily. Take with or immediately following a meal.   Yes Historical Provider, MD  Multiple Vitamins-Minerals (MULTIVITAMINS THER. W/MINERALS) TABS Take 1 tablet by mouth daily.     Yes Historical Provider, MD  pantoprazole (PROTONIX) 40 MG tablet Take 1 tablet (40 mg total) by mouth daily. 02/11/13  Yes Rebecca Eaton, MD  potassium chloride SA (K-DUR,KLOR-CON) 20 MEQ tablet Take 20 mEq by mouth daily.    Yes Historical Provider, MD  Propylene Glycol (SYSTANE BALANCE) 0.6 % SOLN Place 1 drop into both eyes 2 (two) times daily.   Yes Historical Provider, MD   BP 125/63  Pulse 78  Temp(Src) 98 F (36.7 C) (Oral)  Resp 28  Ht 5' (1.524 m)  Wt 173 lb (78.472 kg)  BMI 33.79 kg/m2  SpO2 95% Physical Exam  Nursing note and vitals reviewed. Constitutional: She is oriented to person, place, and time. She appears well-developed and well-nourished.  No distress.  HENT:  Head: Normocephalic and atraumatic.  Mouth/Throat: Oropharynx is clear and moist.  Eyes: Conjunctivae and EOM are normal.  Neck: Neck supple. No tracheal deviation present.  Cardiovascular: Normal rate and regular rhythm.   No murmur heard. Pulmonary/Chest: Effort normal. No respiratory distress.  Abdominal: Soft. Bowel sounds are normal. There is no tenderness.  Musculoskeletal: Normal range of motion. She exhibits edema (low pitting).  Neurological: She is alert and oriented to person, place, and time.  Skin: Skin is warm and dry.  Psychiatric: She has a normal mood and affect. Her behavior is normal.    ED  Course  Procedures (including critical care time) DIAGNOSTIC STUDIES: Oxygen Saturation is 95% on room air, adequate by my interpretation.    COORDINATION OF CARE: 2:00 PM- Treatment plan was discussed with patient who verbalizes understanding and agrees.   3:10 PM- Pt was given 2 more baby aspirins and 2 nitroglycerins and reports pain has resolved. Pt is going to be admitted into the hospital.    Labs Review Labs Reviewed  BASIC METABOLIC PANEL - Abnormal; Notable for the following:    Potassium 3.6 (*)    Glucose, Bld 105 (*)    GFR calc non Af Amer 77 (*)    GFR calc Af Amer 89 (*)    All other components within normal limits  HEPATIC FUNCTION PANEL - Abnormal; Notable for the following:    Total Bilirubin 1.4 (*)    Indirect Bilirubin 1.2 (*)    All other components within normal limits  CBC WITH DIFFERENTIAL  TROPONIN I  LIPASE, BLOOD  PRO B NATRIURETIC PEPTIDE   Results for orders placed during the hospital encounter of 12/27/13  CBC WITH DIFFERENTIAL      Result Value Ref Range   WBC 7.5  4.0 - 10.5 K/uL   RBC 4.23  3.87 - 5.11 MIL/uL   Hemoglobin 13.1  12.0 - 15.0 g/dL   HCT 40.2  36.0 - 46.0 %   MCV 95.0  78.0 - 100.0 fL   MCH 31.0  26.0 - 34.0 pg   MCHC 32.6  30.0 - 36.0 g/dL   RDW 14.5  11.5 - 15.5 %   Platelets 170  150 -  400 K/uL   Neutrophils Relative % 71  43 - 77 %   Neutro Abs 5.3  1.7 - 7.7 K/uL   Lymphocytes Relative 19  12 - 46 %   Lymphs Abs 1.4  0.7 - 4.0 K/uL   Monocytes Relative 9  3 - 12 %   Monocytes Absolute 0.7  0.1 - 1.0 K/uL   Eosinophils Relative 1  0 - 5 %   Eosinophils Absolute 0.1  0.0 - 0.7 K/uL   Basophils Relative 0  0 - 1 %   Basophils Absolute 0.0  0.0 - 0.1 K/uL  BASIC METABOLIC PANEL      Result Value Ref Range   Sodium 138  137 - 147 mEq/L   Potassium 3.6 (*) 3.7 - 5.3 mEq/L   Chloride 98  96 - 112 mEq/L   CO2 26  19 - 32 mEq/L   Glucose, Bld 105 (*) 70 - 99 mg/dL   BUN 15  6 - 23 mg/dL   Creatinine, Ser 0.79  0.50 - 1.10 mg/dL   Calcium 9.2  8.4 - 10.5 mg/dL   GFR calc non Af Amer 77 (*) >90 mL/min   GFR calc Af Amer 89 (*) >90 mL/min   Anion gap 14  5 - 15  TROPONIN I      Result Value Ref Range   Troponin I <0.30  <0.30 ng/mL  HEPATIC FUNCTION PANEL      Result Value Ref Range   Total Protein 7.9  6.0 - 8.3 g/dL   Albumin 4.1  3.5 - 5.2 g/dL   AST 18  0 - 37 U/L   ALT 16  0 - 35 U/L   Alkaline Phosphatase 77  39 - 117 U/L   Total Bilirubin 1.4 (*) 0.3 - 1.2 mg/dL   Bilirubin, Direct 0.2  0.0 - 0.3 mg/dL  Indirect Bilirubin 1.2 (*) 0.3 - 0.9 mg/dL  LIPASE, BLOOD      Result Value Ref Range   Lipase 22  11 - 59 U/L  PRO B NATRIURETIC PEPTIDE      Result Value Ref Range   Pro B Natriuretic peptide (BNP) 198.9  0 - 450 pg/mL     Imaging Review Dg Chest Portable 1 View  12/27/2013   CLINICAL DATA:  Chest pain.  EXAM: PORTABLE CHEST - 1 VIEW  COMPARISON:  Two-view chest x-ray 10/22/2011, 12/30/2006.  FINDINGS: Suboptimal inspiration accounts for crowded bronchovascular markings, especially in the bases, and accentuates the cardiac silhouette. Slight elevation of the left hemidiaphragm with atelectasis at the right lung base. Lungs otherwise clear. No localized airspace consolidation. No pleural effusions. No pneumothorax. Mild pulmonary venous hypertension  without overt edema. Cardiac silhouette mildly enlarged for technique and degree of inspiration, unchanged.  IMPRESSION: Suboptimal inspiration. Elevated right hemidiaphragm with atelectasis at the right lung base. No acute cardiopulmonary disease otherwise. Stable mild cardiomegaly without pulmonary edema.   Electronically Signed   By: Evangeline Dakin M.D.   On: 12/27/2013 13:51     EKG Interpretation   Date/Time:  Sunday December 27 2013 12:58:30 EDT Ventricular Rate:  81 PR Interval:  180 QRS Duration: 86 QT Interval:  412 QTC Calculation: 478 R Axis:   22 Text Interpretation:  Sinus rhythm Probable left atrial enlargement  Borderline T wave abnormalities No significant change since last tracing  Confirmed by Alvan Culpepper  MD, Cataleya Cristina [phone]) on 12/27/2013 2:31:47 PM     Medications  0.9 %  sodium chloride infusion (not administered)  nitroGLYCERIN (NITROSTAT) SL tablet 0.4 mg (0.4 mg Sublingual Given 12/27/13 1456)  aspirin chewable tablet 162 mg (162 mg Oral Given 12/27/13 1444)  ondansetron (ZOFRAN) injection 4 mg (4 mg Intravenous Given 12/27/13 1445)    MDM   Final diagnoses:  Chest pain, unspecified chest pain type    Patient with onset of low substernal epigastric chest pressure tightness at 11:30. Basically continued except for a few brief episodes of no pain until relieved by additional baby aspirin and 2 sublingual nitroglycerin. Patient is now chest pain-free. Patient's chest x-ray without any acute findings. No pulmonary edema no pneumonia no pneumothorax. Patient's birth troponins negative EKG without acute cardiac changes. Patient will require admission for rule out.  I personally performed the services described in this documentation, which was scribed in my presence. The recorded information has been reviewed and is accurate.     Fredia Sorrow, MD 12/27/13 [phone]

## 2013-12-27 NOTE — ED Notes (Signed)
Pt remains pain free, update given, pt requesting something to eat, diet tray ordered.

## 2013-12-27 NOTE — H&P (Signed)
[facility] History and Physical  Angelica [name] BJS:[id number] DOB: [birth year] DOA: 12/27/2013  PCP: [name]., MD  Specialists: Dr. Harding-cardiology  Chief Complaint: Chest pain  HPI: Angelica [name] is a 80 y.o. female with hypertension, hyperlipidemia, hypothyroidism, left-sided breast cancer in the past. She was at church today and had just finished playing the piano and sat down when she noticed chest pain - it was present in the lower part of her sternum and upper part of her stomach (epigastrium). It was associated with nausea vomiting diaphoresis and shortness of breath. She describes it as a tightness or pressure. She came directly to the ER received aspirin and 2 nitroglycerin which helped the pain to resolve. She has not been having episodes of pain like this recently. Last cardiac cath as mentioned below in past medical history.  General: The patient denies anorexia, fever, + about 5 lb weight loss Cardiac: Denies chest pain, syncope, palpitations, + pedal edema - thinks it was her gout Respiratory: Denies cough, shortness of breath, wheezing GI: Denies severe indigestion/heartburn, abdominal pain, nausea, vomiting, diarrhea and constipation GU: Denies hematuria, incontinence, dysuria  Musculoskeletal: + arthritis hip and knee pains Skin: Denies suspicious skin lesions Neurologic: Denies focal weakness or numbness, change in vision  Past Medical History  Diagnosis Date  . HTN (hypertension)   . Hypothyroid   . Hyperlipidemia   . Anemia   . Gout   . Colon polyps     Previous colonoscopy by Dr. Irving Shows  . Breast cancer s/p left mastectomy 2011    left; no adjuvent therapy, Dr. Sonny Dandy  . Arthritis   . Chronic bronchitis   . Diverticulosis of colon   . Hemorrhoids   . MI (myocardial infarction) 1973    By report.  . Coronary artery disease, non-occlusive 2006    Roughly 40% RCA.; Negative Myoview in June 2013    Past Surgical History  Procedure  Laterality Date  . Abdominal hysterectomy      TAH  . Cholecystectomy    . Hemorrhoid surgery    . Colonoscopy       ?3 years ago. Per pt, diverticulosis, hemorrhoids  . Colonoscopy  04/06/2011    Procedure: COLONOSCOPY;  Surgeon: Dorothyann Peng, MD;  Location: [facility];  Service: Endoscopy;  Laterality: N/A;  9:30  . Mastectomy  2011    left  . Cataract extraction w/phaco  07/02/2011    Procedure: CATARACT EXTRACTION PHACO AND INTRAOCULAR LENS PLACEMENT (IOC);  Surgeon: Williams Che, MD;  Location: [facility];  Service: Ophthalmology;  Laterality: Left;  CDE:3.45  . Cataract extraction w/phaco  04/17/2012    Procedure: CATARACT EXTRACTION PHACO AND INTRAOCULAR LENS PLACEMENT (IOC);  Surgeon: Tonny Branch, MD;  Location: [facility];  Service: Ophthalmology;  Laterality: Right;  CDE:13.40  . Cardiac catheterization  2006    Roughly 40% RCA lesion, otherwise normal.  . Nm myoview ltd  June 2013    No ischemia or infarction. Normal EF  . Transthoracic echocardiogram  June 2013    Normal EF, no significant valve disease.  . Laparoscopic lysis of adhesions  2008    Dr. Zella Richer with repair of incisional hernia noted -     Social History: never smoked, no ETOH use Lives at home with husband Manages  ADLs  Allergies  Allergen Reactions  . Bee Venom Anaphylaxis  . Ciprofloxacin Nausea And Vomiting  . Clindamycin Hcl     Does not know  . Flagyl [Metronidazole  Hcl] Nausea And Vomiting  . Shellfish-Derived Products     Rash developed so patient has steered clear of any since last year  . Sulfa Antibiotics Nausea Only    Family History  Problem Relation Age of Onset  . Prostate cancer Father   . Breast cancer Sister   . Colon cancer Neg Hx   . Anesthesia problems Neg Hx   . Hypotension Neg Hx   . Malignant hyperthermia Neg Hx   . Pseudochol deficiency Neg Hx       Prior to Admission medications   Medication Sig Start Date End Date Taking? Authorizing Provider  allopurinol  (ZYLOPRIM) 300 MG tablet Take 300 mg by mouth daily.     Yes Historical Provider, MD  amLODipine (NORVASC) 2.5 MG tablet Take 2.5 mg by mouth daily.     Yes Historical Provider, MD  aspirin EC 81 MG tablet Take 81 mg by mouth daily.   Yes Historical Provider, MD  atorvastatin (LIPITOR) 20 MG tablet Take 1 tablet (20 mg total) by mouth daily. 07/02/13  Yes Troy Sine, MD  enalapril (VASOTEC) 10 MG tablet Take 10 mg by mouth daily.   Yes Historical Provider, MD  furosemide (LASIX) 20 MG tablet Take 20 mg by mouth daily.    Yes Historical Provider, MD  levothyroxine (SYNTHROID, LEVOTHROID) 50 MCG tablet Take 50 mcg by mouth daily.    Yes Historical Provider, MD  metoprolol succinate (TOPROL-XL) 50 MG 24 hr tablet Take 50 mg by mouth daily. Take with or immediately following a meal.   Yes Historical Provider, MD  Multiple Vitamins-Minerals (MULTIVITAMINS THER. W/MINERALS) TABS Take 1 tablet by mouth daily.     Yes Historical Provider, MD  pantoprazole (PROTONIX) 40 MG tablet Take 1 tablet (40 mg total) by mouth daily. 02/11/13  Yes Rebecca Eaton, MD  potassium chloride SA (K-DUR,KLOR-CON) 20 MEQ tablet Take 20 mEq by mouth daily.    Yes Historical Provider, MD  Propylene Glycol (SYSTANE BALANCE) 0.6 % SOLN Place 1 drop into both eyes 2 (two) times daily.   Yes Historical Provider, MD     Physical Exam: Filed Vitals:   12/27/13 1430 12/27/13 1454 12/27/13 1603 12/27/13 1604  BP: 129/73 107/59  126/69  Pulse: 75 77 68 71  Temp:   98.1 F (36.7 C) 98.1 F (36.7 C)  TempSrc:   Temporal Oral  Resp: 32 20 22   Height:      Weight:      SpO2: 96% 94% 95% 95%     General: Awake alert oriented x3, comfortable HEENT: Normocephalic and Atraumatic, Mucous membranes pink                PERRLA; EOM intact; No scleral icterus,                 Nares: Patent, Oropharynx: Clear, Fair Dentition                 Neck: FROM, no cervical lymphadenopathy, thyromegaly, carotid bruit or JVD;  Breasts:  deferred CHEST WALL: No tenderness  CHEST: Normal respiration, clear to auscultation bilaterally  HEART: Regular rate and rhythm; no murmurs rubs or gallops  BACK: No kyphosis or scoliosis; no CVA tenderness  ABDOMEN: Positive Bowel Sounds, soft, non-tender; no masses, no organomegaly Rectal Exam: deferred EXTREMITIES: No cyanosis, clubbing, has edema of her left arm secondary to mastectomy Genitalia: not examined  SKIN:  no rash or ulceration  CNS: Alert and Oriented x 4, Nonfocal  exam, CN 2-12 intact  Labs on Admission:  Basic Metabolic Panel:  Recent Labs Lab 12/22/13 1157 12/27/13 1322  NA 141 138  K 4.1 3.6*  CL 100 98  CO2 29 26  GLUCOSE 99 105*  BUN 15 15  CREATININE 0.87 0.79  CALCIUM 9.3 9.2   Liver Function Tests:  Recent Labs Lab 12/22/13 1157 12/27/13 1322  AST 18 18  ALT 14 16  ALKPHOS 75 77  BILITOT 1.2 1.4*  PROT 7.7 7.9  ALBUMIN 4.0 4.1    Recent Labs Lab 12/27/13 1322  LIPASE 22   No results found for this basename: AMMONIA,  in the last 168 hours CBC:  Recent Labs Lab 12/22/13 1157 12/27/13 1322  WBC 6.9 7.5  NEUTROABS 4.0 5.3  HGB 13.0 13.1  HCT 39.7 40.2  MCV 95.4 95.0  PLT 165 170   Cardiac Enzymes:  Recent Labs Lab 12/27/13 1322  TROPONINI <0.30    BNP (last 3 results)  Recent Labs  12/27/13 1322  PROBNP 198.9   CBG: No results found for this basename: GLUCAP,  in the last 168 hours  Radiological Exams on Admission: Dg Chest Portable 1 View  12/27/2013   CLINICAL DATA:  Chest pain.  EXAM: PORTABLE CHEST - 1 VIEW  COMPARISON:  Two-view chest x-ray 10/22/2011, 12/30/2006.  FINDINGS: Suboptimal inspiration accounts for crowded bronchovascular markings, especially in the bases, and accentuates the cardiac silhouette. Slight elevation of the left hemidiaphragm with atelectasis at the right lung base. Lungs otherwise clear. No localized airspace consolidation. No pleural effusions. No pneumothorax. Mild pulmonary venous  hypertension without overt edema. Cardiac silhouette mildly enlarged for technique and degree of inspiration, unchanged.  IMPRESSION: Suboptimal inspiration. Elevated right hemidiaphragm with atelectasis at the right lung base. No acute cardiopulmonary disease otherwise. Stable mild cardiomegaly without pulmonary edema.   Electronically Signed   By: Evangeline Dakin M.D.   On: 12/27/2013 13:51    EKG: Independently reviewed. Normal sinus rhythm with inverted T waves in lead 3  Assessment/Plan Principal Problem:   Chest pain-Coronary artery disease, non-occlusive- -Will cycle 3 sets of troponins, start Lovenox the pain seems to be quite typical of ACS. -Will continue aspirin and metoprolol -Will consult cardiology - [facility]  Active Problems:   Benign hypertension -Continue amlodipine, enalapril, metoprolol    Hypokalemia -Will replace and also check magnesium    Hypothyroidism -Continue levothyroxine    Hyperlipidemia -Continue statin       Consulted: cardiology  Code Status:  Full code Family Communication: with husband  DVT Prophylaxis:full dose lovenox  Time spent: 5 min  [place], MD [facility] [email or web address] 12/27/2013, 4:38 PM

## 2013-12-27 NOTE — ED Notes (Signed)
Pt c/o mid center chest pressure that started this am with sob, nausea, vomiting X1, diaphoresis, pt reports that the pressure comes and goes, started while she was at church. Ekg performed upon pt arrival to er, pt NSR on monitor with PVC noted. Denies any radiation to arms, jaw or neck area, does state that the pain radiates to the top of her abd area.

## 2013-12-27 NOTE — ED Notes (Signed)
P[t denies any pain, updated on plan of care and admission,

## 2013-12-28 DIAGNOSIS — R079 Chest pain, unspecified: Secondary | ICD-10-CM

## 2013-12-28 DIAGNOSIS — I10 Essential (primary) hypertension: Secondary | ICD-10-CM

## 2013-12-28 DIAGNOSIS — I379 Nonrheumatic pulmonary valve disorder, unspecified: Secondary | ICD-10-CM | POA: Diagnosis not present

## 2013-12-28 DIAGNOSIS — I2510 Atherosclerotic heart disease of native coronary artery without angina pectoris: Secondary | ICD-10-CM | POA: Diagnosis not present

## 2013-12-28 LAB — BASIC METABOLIC PANEL
Anion gap: 11 (ref 5–15)
BUN: 12 mg/dL (ref 6–23)
CO2: 30 mEq/L (ref 19–32)
Calcium: 9.1 mg/dL (ref 8.4–10.5)
Chloride: 97 mEq/L (ref 96–112)
Creatinine, Ser: 0.79 mg/dL (ref 0.50–1.10)
GFR, EST AFRICAN AMERICAN: 89 mL/min — AB (ref >90)
GFR, EST NON AFRICAN AMERICAN: 77 mL/min — AB (ref >90)
Glucose, Bld: 104 mg/dL — ABNORMAL HIGH (ref 70–99)
POTASSIUM: 3.8 meq/L (ref 3.7–5.3)
Sodium: 138 mEq/L (ref 137–147)

## 2013-12-28 LAB — TROPONIN I

## 2013-12-28 LAB — CBC
HCT: 41.1 % (ref 36.0–46.0)
Hemoglobin: 13.4 g/dL (ref 12.0–15.0)
MCH: 31.2 pg (ref 26.0–34.0)
MCHC: 32.6 g/dL (ref 30.0–36.0)
MCV: 95.6 fL (ref 78.0–100.0)
Platelets: 156 10*3/uL (ref 150–400)
RBC: 4.3 MIL/uL (ref 3.87–5.11)
RDW: 14.6 % (ref 11.5–15.5)
WBC: 6.8 10*3/uL (ref 4.0–10.5)

## 2013-12-28 MED ORDER — ALUM & MAG HYDROXIDE-SIMETH 200-200-20 MG/5ML PO SUSP: 30.0000 mL | Freq: Four times a day (QID) | ORAL | Status: DC | PRN

## 2013-12-28 NOTE — Discharge Summary (Addendum)
Physician Discharge Summary  Angelica [name] ION:[id number] DOB: 10/08/1932 DOA: 12/27/2013  PCP: [name]., MD  Admit date: 12/27/2013 Discharge date: 12/29/2013  Time spent: 40 minutes  Recommendations for Outpatient Follow-up:  1. Dr Ellyn Hack cardiology on 01/05/14. Evaluation of chest pain. Evaluate need for OP stress test.  Follow echo results.    Discharge Diagnoses:  Principal Problem:   Chest pain Active Problems:   Benign hypertension   Hypokalemia   Hypothyroidism   Hyperlipidemia   Coronary artery disease, non-occlusive   Discharge Condition: stable  Diet recommendation: heart healthy  Filed Weights   12/27/13 1256 12/27/13 2054  Weight: 78.472 kg (173 lb) 79.969 kg (176 lb 4.8 oz)    History of present illness:  Angelica [name] is a 80 y.o. female with hypertension, hyperlipidemia, hypothyroidism, left-sided breast cancer in the past. She was at church 12/27/13 and had just finished playing the piano and sat down when she noticed chest pain - it was present in the lower part of her sternum and upper part of her stomach (epigastrium). It was associated with nausea vomiting diaphoresis and shortness of breath. She describeed it as a tightness or pressure. She came directly to the ED received aspirin and 2 nitroglycerin which helped the pain to resolve. She had not been having episodes of pain like this recently. Last cardiac cath 2006 .   Hospital Course:  Chest pain: Troponin negative x 3 and EKG without acute changes or evidence of ischemia. Nonobstructive disease on cath in 2006, negative myoview 2013, normal EF on echo 2013. Echo repeated and results pending at discharge and to be followed by cardiology 01/05/14. She ambulated in hall without chest pain.  Patient evaluated by cardiology who opine pain not likely cardiac in nature and recommend followup as an outpatient with Dr. Ellyn Hack and decide if stress testing is necessary. Appointment 01/05/14  CAD - follow up  with Dr. Ellyn Hack.  HTN: Controlled during this hospitalization. Continue  Toprol, Vasotec, and Norvasc and Lasix   Hyperlipidemia treated with Lipitor   Hx Breast CA  Procedures:  Echo results pending  Consultations:  Dr Harrington Challenger cardiology  Discharge Exam: Filed Vitals:   12/28/13 1431  BP: 143/84  Pulse: 71  Temp: 98.3 F (36.8 C)  Resp: 18    General: well nourished NAD Cardiovascular: RRR No MGR No LE edema Respiratory: normal effort BS clear bilaterally no wheeze  Discharge Instructions You were cared for by a hospitalist during your hospital stay. If you have any questions about your discharge medications or the care you received while you were in the hospital after you are discharged, you can call the unit and asked to speak with the hospitalist on call if the hospitalist that took care of you is not available. Once you are discharged, your primary care physician will handle any further medical issues. Please note that NO REFILLS for any discharge medications will be authorized once you are discharged, as it is imperative that you return to your primary care physician (or establish a relationship with a primary care physician if you do not have one) for your aftercare needs so that they can reassess your need for medications and monitor your lab values.      Discharge Instructions   Diet - low sodium heart healthy    Complete by:  As directed      Discharge instructions    Complete by:  As directed   Take meds as directed Keep appointment as scheduled  Increase activity slowly    Complete by:  As directed             Medication List         allopurinol 300 MG tablet  Commonly known as:  ZYLOPRIM  Take 300 mg by mouth daily.     alum & mag hydroxide-simeth 200-200-20 MG/5ML suspension  Commonly known as:  MAALOX/MYLANTA  Take 30 mLs by mouth every 6 (six) hours as needed for indigestion or heartburn (dyspepsia).     amLODipine 2.5 MG tablet  Commonly  known as:  NORVASC  Take 2.5 mg by mouth daily.     aspirin EC 81 MG tablet  Take 81 mg by mouth daily.     atorvastatin 20 MG tablet  Commonly known as:  LIPITOR  Take 1 tablet (20 mg total) by mouth daily.     enalapril 10 MG tablet  Commonly known as:  VASOTEC  Take 10 mg by mouth daily.     furosemide 20 MG tablet  Commonly known as:  LASIX  Take 20 mg by mouth daily.     levothyroxine 50 MCG tablet  Commonly known as:  SYNTHROID, LEVOTHROID  Take 50 mcg by mouth daily.     metoprolol succinate 50 MG 24 hr tablet  Commonly known as:  TOPROL-XL  Take 50 mg by mouth daily. Take with or immediately following a meal.     multivitamins ther. w/minerals Tabs tablet  Take 1 tablet by mouth daily.     pantoprazole 40 MG tablet  Commonly known as:  PROTONIX  Take 1 tablet (40 mg total) by mouth daily.     potassium chloride SA 20 MEQ tablet  Commonly known as:  K-DUR,KLOR-CON  Take 20 mEq by mouth daily.     SYSTANE BALANCE 0.6 % Soln  Generic drug:  Propylene Glycol  Place 1 drop into both eyes 2 (two) times daily.       Allergies  Allergen Reactions  . Bee Venom Anaphylaxis  . Ciprofloxacin Nausea And Vomiting  . Clindamycin Hcl     Does not know  . Flagyl [Metronidazole Hcl] Nausea And Vomiting  . Shellfish-Derived Products     Rash developed so patient has steered clear of any since last year  . Sulfa Antibiotics Nausea Only   Follow-up Information   Follow up with Leonie Man, MD On 01/05/2014. (appointment at 12 noon)    Specialty:  Cardiology   Contact information:   [address] [place] [address] [phone]       Follow up with [name]., MD. Schedule an appointment as soon as possible for a visit in 1 week. (for evaluation of symptoms. recommend BMET to track potassium level)    Specialty:  Internal Medicine   Contact information:   [place] [address] [phone]        The results of  significant diagnostics from this hospitalization (including imaging, microbiology, ancillary and laboratory) are listed below for reference.    Significant Diagnostic Studies: Dg Chest Portable 1 View  12/27/2013   CLINICAL DATA:  Chest pain.  EXAM: PORTABLE CHEST - 1 VIEW  COMPARISON:  Two-view chest x-ray 10/22/2011, 12/30/2006.  FINDINGS: Suboptimal inspiration accounts for crowded bronchovascular markings, especially in the bases, and accentuates the cardiac silhouette. Slight elevation of the left hemidiaphragm with atelectasis at the right lung base. Lungs otherwise clear. No localized airspace consolidation. No pleural effusions. No pneumothorax. Mild pulmonary venous hypertension without  overt edema. Cardiac silhouette mildly enlarged for technique and degree of inspiration, unchanged.  IMPRESSION: Suboptimal inspiration. Elevated right hemidiaphragm with atelectasis at the right lung base. No acute cardiopulmonary disease otherwise. Stable mild cardiomegaly without pulmonary edema.   Electronically Signed   By: Evangeline Dakin M.D.   On: 12/27/2013 13:51    Microbiology: No results found for this or any previous visit (from the past 240 hour(s)).   Labs: Basic Metabolic Panel:  Recent Labs Lab 12/27/13 1322 12/28/13 0838  NA 138 138  K 3.6* 3.8  CL 98 97  CO2 26 30  GLUCOSE 105* 104*  BUN 15 12  CREATININE 0.79 0.79  CALCIUM 9.2 9.1  MG 1.8  --    Liver Function Tests:  Recent Labs Lab 12/27/13 1322  AST 18  ALT 16  ALKPHOS 77  BILITOT 1.4*  PROT 7.9  ALBUMIN 4.1    Recent Labs Lab 12/27/13 1322  LIPASE 22   No results found for this basename: AMMONIA,  in the last 168 hours CBC:  Recent Labs Lab 12/27/13 1322 12/28/13 0838  WBC 7.5 6.8  NEUTROABS 5.3  --   HGB 13.1 13.4  HCT 40.2 41.1  MCV 95.0 95.6  PLT 170 156   Cardiac Enzymes:  Recent Labs Lab 12/27/13 1322 12/28/13 0838  TROPONINI <0.30 <0.30   BNP: BNP (last 3 results)  Recent  Labs  12/27/13 1322  PROBNP 198.9   CBG: No results found for this basename: GLUCAP,  in the last 168 hours     Signed:  Marzetta Board  [facility] 12/29/2013, 5:40 PM

## 2013-12-28 NOTE — Consult Note (Signed)
Reason for Consult:chest pain Referring Physician: PTH  PCP: [name]., MD  Specialists: Dr. Harding-cardiology  Angelica [name] is an 80 y.o. female.  HPI: This is a 80 yr old female patient of Dr. Ellyn Hack admitted with chest pain. She has long history of chest pain with nonobstructive disease on cardiac cath 2006, normal myoview 11/2011, normal EF on 2Decho 11/2011. She also has HTN, HLD, left breast CA.  Yesterday while playing the piano at church she developed epigastric pain associated with shortness of breath, nausea, vomitting, and diaphoresis. Pain relieved with 2 NTG and ASA. EKG unchanged and Troponin negative x 1. Patient says she has done extremely well recently. [place] went to [place] over [place] and walked the city without any trouble. She also had a conference she attended all day Saturday and thinks she was just worn out and exhausted. She has not had any exertional chest pain recently.  Past Medical History  Diagnosis Date  . HTN (hypertension)   . Hypothyroid   . Hyperlipidemia   . Anemia   . Gout   . Colon polyps     Previous colonoscopy by Dr. Irving Shows  . Breast cancer 2011    left; no adjuvent therapy, Dr. Sonny Dandy  . Arthritis   . Chronic bronchitis   . Diverticulosis of colon   . Hemorrhoids   . MI (myocardial infarction) 1973    By report.  . Coronary artery disease, non-occlusive 2006    Roughly 40% RCA.; Negative Myoview in June 2013    Past Surgical History  Procedure Laterality Date  . Abdominal hysterectomy      TAH  . Cholecystectomy    . Hemorrhoid surgery    . Colonoscopy       ?3 years ago. Per pt, diverticulosis, hemorrhoids  . Colonoscopy  04/06/2011    Procedure: COLONOSCOPY;  Surgeon: Dorothyann Peng, MD;  Location: [facility];  Service: Endoscopy;  Laterality: N/A;  9:30  . Mastectomy  2011    left  . Cataract extraction w/phaco  07/02/2011    Procedure: CATARACT EXTRACTION PHACO AND INTRAOCULAR LENS PLACEMENT (IOC);  Surgeon: Williams Che, MD;  Location: [facility];  Service: Ophthalmology;  Laterality: Left;  CDE:3.45  . Cataract extraction w/phaco  04/17/2012    Procedure: CATARACT EXTRACTION PHACO AND INTRAOCULAR LENS PLACEMENT (IOC);  Surgeon: Tonny Branch, MD;  Location: [facility];  Service: Ophthalmology;  Laterality: Right;  CDE:13.40  . Cardiac catheterization  2006    Roughly 40% RCA lesion, otherwise normal.  . Nm myoview ltd  June 2013    No ischemia or infarction. Normal EF  . Transthoracic echocardiogram  June 2013    Normal EF, no significant valve disease.  . Laparoscopic lysis of adhesions  2008    Dr. Zella Richer with repair of incisional hernia noted -     Family History  Problem Relation Age of Onset  . Prostate cancer Father   . Breast cancer Sister   . Colon cancer Neg Hx   . Anesthesia problems Neg Hx   . Hypotension Neg Hx   . Malignant hyperthermia Neg Hx   . Pseudochol deficiency Neg Hx     Social History:  reports that she has never smoked. She has never used smokeless tobacco. She reports that she does not drink alcohol or use illicit drugs.  Allergies:  Allergies  Allergen Reactions  . Bee Venom Anaphylaxis  . Ciprofloxacin Nausea And Vomiting  . Clindamycin Hcl  Does not know  . Flagyl [Metronidazole Hcl] Nausea And Vomiting  . Shellfish-Derived Products     Rash developed so patient has steered clear of any since last year  . Sulfa Antibiotics Nausea Only    Medications:  Scheduled Meds: . allopurinol  300 mg Oral Daily  . amLODipine  2.5 mg Oral Daily  . aspirin EC  81 mg Oral Daily  . atorvastatin  20 mg Oral Daily  . enalapril  10 mg Oral Daily  . enoxaparin (LOVENOX) injection  80 mg Subcutaneous Q12H  . furosemide  20 mg Oral Daily  . levothyroxine  50 mcg Oral QAC breakfast  . metoprolol succinate  50 mg Oral Daily  . pantoprazole  40 mg Oral Daily  . polyvinyl alcohol  1 drop Both Eyes BID  . potassium chloride SA  20 mEq Oral Daily  . sodium chloride  3 mL  Intravenous Q12H   Continuous Infusions: . sodium chloride     PRN Meds:.sodium chloride, acetaminophen, acetaminophen, alum & mag hydroxide-simeth, nitroGLYCERIN, ondansetron (ZOFRAN) IV, ondansetron, potassium chloride, sodium chloride   Results for orders placed during the hospital encounter of 12/27/13 (from the past 48 hour(s))  CBC WITH DIFFERENTIAL     Status: None   Collection Time    12/27/13  1:22 PM      Result Value Ref Range   WBC 7.5  4.0 - 10.5 K/uL   RBC 4.23  3.87 - 5.11 MIL/uL   Hemoglobin 13.1  12.0 - 15.0 g/dL   HCT 40.2  36.0 - 46.0 %   MCV 95.0  78.0 - 100.0 fL   MCH 31.0  26.0 - 34.0 pg   MCHC 32.6  30.0 - 36.0 g/dL   RDW 14.5  11.5 - 15.5 %   Platelets 170  150 - 400 K/uL   Neutrophils Relative % 71  43 - 77 %   Neutro Abs 5.3  1.7 - 7.7 K/uL   Lymphocytes Relative 19  12 - 46 %   Lymphs Abs 1.4  0.7 - 4.0 K/uL   Monocytes Relative 9  3 - 12 %   Monocytes Absolute 0.7  0.1 - 1.0 K/uL   Eosinophils Relative 1  0 - 5 %   Eosinophils Absolute 0.1  0.0 - 0.7 K/uL   Basophils Relative 0  0 - 1 %   Basophils Absolute 0.0  0.0 - 0.1 K/uL  BASIC METABOLIC PANEL     Status: Abnormal   Collection Time    12/27/13  1:22 PM      Result Value Ref Range   Sodium 138  137 - 147 mEq/L   Potassium 3.6 (*) 3.7 - 5.3 mEq/L   Chloride 98  96 - 112 mEq/L   CO2 26  19 - 32 mEq/L   Glucose, Bld 105 (*) 70 - 99 mg/dL   BUN 15  6 - 23 mg/dL   Creatinine, Ser 0.79  0.50 - 1.10 mg/dL   Calcium 9.2  8.4 - 10.5 mg/dL   GFR calc non Af Amer 77 (*) >90 mL/min   GFR calc Af Amer 89 (*) >90 mL/min   Comment: (NOTE)     The eGFR has been calculated using the CKD EPI equation.     This calculation has not been validated in all clinical situations.     eGFR's persistently <90 mL/min signify possible Chronic Kidney     Disease.   Anion gap 14  5 - 15  TROPONIN I     Status: None   Collection Time    12/27/13  1:22 PM      Result Value Ref Range   Troponin I <0.30  <0.30  ng/mL   Comment:            Due to the release kinetics of cTnI,     a negative result within the first hours     of the onset of symptoms does not rule out     myocardial infarction with certainty.     If myocardial infarction is still suspected,     repeat the test at appropriate intervals.  HEPATIC FUNCTION PANEL     Status: Abnormal   Collection Time    12/27/13  1:22 PM      Result Value Ref Range   Total Protein 7.9  6.0 - 8.3 g/dL   Albumin 4.1  3.5 - 5.2 g/dL   AST 18  0 - 37 U/L   ALT 16  0 - 35 U/L   Alkaline Phosphatase 77  39 - 117 U/L   Total Bilirubin 1.4 (*) 0.3 - 1.2 mg/dL   Bilirubin, Direct 0.2  0.0 - 0.3 mg/dL   Indirect Bilirubin 1.2 (*) 0.3 - 0.9 mg/dL  LIPASE, BLOOD     Status: None   Collection Time    12/27/13  1:22 PM      Result Value Ref Range   Lipase 22  11 - 59 U/L  PRO B NATRIURETIC PEPTIDE     Status: None   Collection Time    12/27/13  1:22 PM      Result Value Ref Range   Pro B Natriuretic peptide (BNP) 198.9  0 - 450 pg/mL  MAGNESIUM     Status: None   Collection Time    12/27/13  1:22 PM      Result Value Ref Range   Magnesium 1.8  1.5 - 2.5 mg/dL    Dg Chest Portable 1 View  12/27/2013   CLINICAL DATA:  Chest pain.  EXAM: PORTABLE CHEST - 1 VIEW  COMPARISON:  Two-view chest x-ray 10/22/2011, 12/30/2006.  FINDINGS: Suboptimal inspiration accounts for crowded bronchovascular markings, especially in the bases, and accentuates the cardiac silhouette. Slight elevation of the left hemidiaphragm with atelectasis at the right lung base. Lungs otherwise clear. No localized airspace consolidation. No pleural effusions. No pneumothorax. Mild pulmonary venous hypertension without overt edema. Cardiac silhouette mildly enlarged for technique and degree of inspiration, unchanged.  IMPRESSION: Suboptimal inspiration. Elevated right hemidiaphragm with atelectasis at the right lung base. No acute cardiopulmonary disease otherwise. Stable mild cardiomegaly  without pulmonary edema.   Electronically Signed   By: Evangeline Dakin M.D.   On: 12/27/2013 13:51    ROS See HPI Eyes: Negative Ears:Negative for hearing loss, tinnitus Cardiovascular: Negative for palpitations,irregular heartbeat, dyspnea, dyspnea on exertion, near-syncope, orthopnea, paroxysmal nocturnal dyspnea and syncope,edema, claudication, cyanosis,.  Respiratory:   Negative for cough, hemoptysis, shortness of breath, sleep disturbances due to breathing, sputum production and wheezing.   Endocrine: Negative for cold intolerance and heat intolerance.  Hematologic/Lymphatic: Negative for adenopathy and bleeding problem. Does not bruise/bleed easily.  Musculoskeletal: Negative.   Gastrointestinal: positive for nausea, vomiting, reflux, abdominal pain, diarrhea, constipation.  has hx diverticulitis. Genitourinary: Negative for bladder incontinence, dysuria, flank pain, frequency, hematuria, hesitancy, nocturia and urgency.  Neurological: Negative.  Allergic/Immunologic: Negative for environmental allergies.  Blood pressure 149/76, pulse 62, temperature 98.2 F (36.8 C), temperature source Oral,  resp. rate 20, height 5' (1.524 m), weight 176 lb 4.8 oz (79.969 kg), SpO2 97.00%. Physical Exam PHYSICAL EXAM: Well-nournished, in no acute distress. Neck: No JVD, HJR, Bruit, or thyroid enlargement Lungs: No tachypnea, clear without wheezing, rales, or rhonchi Cardiovascular: RRR, PMI not displaced, 2/6 systolic murmur at the left sternal border, no gallops, bruit, thrill, or heave. Abdomen: BS normal. Soft without organomegaly, masses, lesions or tenderness. Extremities: without cyanosis, clubbing or edema. Good distal pulses bilateral SKin: Warm, no lesions or rashes  Musculoskeletal: No deformities Neuro: no focal signs  EKG: NSR with no acute change.  Assessment/Plan: Chest pain: Troponin negative x 1 and EKG unchanged. Nonobstructive disease on cath in 2006, negative myoview 2013,  normal EF on echo 2013. Can recheck echo. Followup as an outpatient with Dr. Ellyn Hack and decide if stress testing is necessary.  HTN: Controlled on Toprol, Vasotec, and Norvasc and Lasix  Hyperlipidemia treated with Lipitor  Hx Breast CA  Ermalinda Barrios 12/28/2013, 8:52 AM   Patient seen and examined  I agree with findings of M Lenze above  Patient with known CAD (nonobstructive)   Was doing very well recently  Went to [place] d/c  Walking a lot  No problems  Had spell yesterday N/ epigastric pain.   R/O for MI   I am not convinced cardiac  I would get patinet walking around  Get echo  If does OK would d/c with outpatient f/u.    Dorris Carnes

## 2013-12-28 NOTE — Progress Notes (Signed)
Nutrition Brief Note  Patient identified on the Malnutrition Screening Tool (MST) Report  Pt admitted with chest pain. She reports good appetite and denies weight loss which is also reflected in hx below.  Wt Readings from Last 15 Encounters:  12/27/13 176 lb 4.8 oz (79.969 kg)  12/22/13 175 lb 14.4 oz (79.788 kg)  10/31/13 172 lb (78.019 kg)  07/06/13 174 lb 3.2 oz (79.017 kg)  06/19/13 175 lb 6.4 oz (79.561 kg)  04/10/12 170 lb (77.111 kg)  10/22/11 168 lb 14 oz (76.6 kg)  06/28/11 165 lb (74.844 kg)  03/14/11 168 lb 3.2 oz (76.295 kg)    Body mass index is 34.43 kg/(m^2). Patient meets criteria for obesity class I based on current BMI. There is no significant change.  Current diet order is Low Sodium, patient is consuming approximately 100% of meals at this time. Labs and medications reviewed.   No nutrition interventions warranted at this time. If nutrition issues arise, please consult RD.   Colman Cater MS,RD,CSG,LDN Office: [phone] Pager: [phone]

## 2013-12-28 NOTE — Progress Notes (Signed)
Pt ambulated in hallway this afternoon. Pt was able to ambulate two laps around the nurses station and back to room. Pt denies any chest pain but did feel some nausea and SOB while ambulating. Pt back in bed resting at this time.

## 2013-12-28 NOTE — Progress Notes (Signed)
  Echocardiogram 2D Echocardiogram has been performed.  [place], [place] 12/28/2013, 3:33 PM

## 2013-12-28 NOTE — Discharge Summary (Addendum)
I have seen and examined this patient and I agree with the above assessment and plan.  Patient here with chest pain without positive cardiac enzymes or EKG changes. She will be discharged home with outpatient follow up with Dr. Ellyn Hack. She ambulated in the hallway without chest pain and was feeling at baseline. Physical exam with RRR without murmurs, lungs clear and no edema.   Marzetta Board, MD [facility] [phone]

## 2013-12-31 NOTE — Progress Notes (Signed)
UR chart review completed.  

## 2014-01-04 DIAGNOSIS — Z6832 Body mass index (BMI) 32.0-32.9, adult: Secondary | ICD-10-CM | POA: Diagnosis not present

## 2014-01-04 DIAGNOSIS — E876 Hypokalemia: Secondary | ICD-10-CM | POA: Diagnosis not present

## 2014-01-04 DIAGNOSIS — E039 Hypothyroidism, unspecified: Secondary | ICD-10-CM | POA: Diagnosis not present

## 2014-01-04 DIAGNOSIS — I10 Essential (primary) hypertension: Secondary | ICD-10-CM | POA: Diagnosis not present

## 2014-01-04 DIAGNOSIS — R079 Chest pain, unspecified: Secondary | ICD-10-CM | POA: Diagnosis not present

## 2014-01-05 ENCOUNTER — Encounter: Payer: Self-pay | Admitting: Cardiology

## 2014-01-05 ENCOUNTER — Encounter (HOSPITAL_COMMUNITY): Payer: Self-pay | Admitting: *Deleted

## 2014-01-05 ENCOUNTER — Ambulatory Visit (INDEPENDENT_AMBULATORY_CARE_PROVIDER_SITE_OTHER): Payer: Medicare Other | Admitting: Cardiology

## 2014-01-05 VITALS — BP 136/82 | HR 78 | Ht 60.0 in | Wt 171.4 lb

## 2014-01-05 DIAGNOSIS — R079 Chest pain, unspecified: Secondary | ICD-10-CM | POA: Diagnosis not present

## 2014-01-05 DIAGNOSIS — C50919 Malignant neoplasm of unspecified site of unspecified female breast: Secondary | ICD-10-CM | POA: Diagnosis not present

## 2014-01-05 DIAGNOSIS — E785 Hyperlipidemia, unspecified: Secondary | ICD-10-CM | POA: Diagnosis not present

## 2014-01-05 DIAGNOSIS — I200 Unstable angina: Secondary | ICD-10-CM

## 2014-01-05 DIAGNOSIS — C50912 Malignant neoplasm of unspecified site of left female breast: Secondary | ICD-10-CM

## 2014-01-05 DIAGNOSIS — I119 Hypertensive heart disease without heart failure: Secondary | ICD-10-CM

## 2014-01-05 MED ORDER — NITROGLYCERIN 0.4 MG SL SUBL: 0.4000 mg | SUBLINGUAL_TABLET | SUBLINGUAL | Status: DC | PRN

## 2014-01-05 NOTE — Assessment & Plan Note (Signed)
Lt mastectomy Oct 2011

## 2014-01-05 NOTE — Patient Instructions (Signed)
Your physician recommends that you schedule a follow-up appointment in: 6 Months with Dr Ellyn Hack unless stress test id abnormal  Your physician has requested that you have a lexiscan myoview. For further information please visit [email or web address]. Please follow instruction sheet, as given.  Your physician has recommended you make the following change in your medication: Increase Amlodipine to 5 mg daily

## 2014-01-05 NOTE — Assessment & Plan Note (Signed)
B/P controlled 

## 2014-01-05 NOTE — Assessment & Plan Note (Signed)
Roughly 40% RCA in 2006.; Negative Myoview in June 2013

## 2014-01-05 NOTE — Assessment & Plan Note (Signed)
On statin.

## 2014-01-05 NOTE — Progress Notes (Signed)
01/05/2014 Angelica [name]   [birth year]  [id number]  Primary Physicia Angelica [name]., MD Primary Cardiologist: Dr Ellyn Hack  HPI:  80 y/o from [place] with a history of cath in 2006 that showed a 40% RCA. Myoview was low risk in June 2013. On 7/19 she was at church when she had nausea and chest tightness. Her daughter said she was pale and diaphoretic. The chest discomfort aspect was minimalized by the pt. She was admitted and Dr Harrington Challenger saw her in consult on 7/20. Troponin were negative and an echo was WNL. She is here today for follow up. She did admitted to some further chest discomfort at home for which she took asa with relief. She says that the NTG given to her on 7/19 did help her symptoms.    Current Outpatient Prescriptions  Medication Sig Dispense Refill  . allopurinol (ZYLOPRIM) 300 MG tablet Take 300 mg by mouth daily.        Marland Kitchen alum & mag hydroxide-simeth (MAALOX/MYLANTA) 200-200-20 MG/5ML suspension Take 30 mLs by mouth every 6 (six) hours as needed for indigestion or heartburn (dyspepsia).  355 mL  0  . amLODipine (NORVASC) 2.5 MG tablet Take 2.5 mg by mouth daily.        Marland Kitchen aspirin EC 81 MG tablet Take 81 mg by mouth daily.      Marland Kitchen atorvastatin (LIPITOR) 20 MG tablet Take 1 tablet (20 mg total) by mouth daily.  30 tablet  11  . enalapril (VASOTEC) 10 MG tablet Take 10 mg by mouth daily.      . furosemide (LASIX) 20 MG tablet Take 20 mg by mouth daily.       Marland Kitchen levothyroxine (SYNTHROID, LEVOTHROID) 50 MCG tablet Take 50 mcg by mouth daily.       . metoprolol succinate (TOPROL-XL) 50 MG 24 hr tablet Take 50 mg by mouth daily. Take with or immediately following a meal.      . Multiple Vitamins-Minerals (MULTIVITAMINS THER. W/MINERALS) TABS Take 1 tablet by mouth daily.        . pantoprazole (PROTONIX) 40 MG tablet Take 1 tablet (40 mg total) by mouth daily.  30 tablet  10  . potassium chloride SA (K-DUR,KLOR-CON) 20 MEQ tablet Take 20 mEq by mouth daily.       Marland Kitchen Propylene Glycol  (SYSTANE BALANCE) 0.6 % SOLN Place 1 drop into both eyes 2 (two) times daily.       No current facility-administered medications for this visit.    Allergies  Allergen Reactions  . Bee Venom Anaphylaxis  . Ciprofloxacin Nausea And Vomiting  . Clindamycin Hcl     Does not know  . Flagyl [Metronidazole Hcl] Nausea And Vomiting  . Shellfish-Derived Products     Rash developed so patient has steered clear of any since last year  . Sulfa Antibiotics Nausea Only    History   Social History  . Marital Status: Married    Spouse Name: N/A    Number of Children: 2  . Years of Education: N/A   Occupational History  . retired    Social History Main Topics  . Smoking status: Never Smoker   . Smokeless tobacco: Never Used  . Alcohol Use: No  . Drug Use: No  . Sexual Activity: Not Currently   Other Topics Concern  . Not on file   Social History Narrative   She is a very pleasant married African American woman with 2 children and 3 grandchildren. She does  with her husband who is retired Social research officer, government. They now live on a large farm in the [place] area with a roughly 1/4 mile driveway. She usually walks a mile about twice a day, one by herself and 1 with her husband. She also enjoys doing gardening.     Review of Systems: General: negative for chills, fever, night sweats or weight changes.  Cardiovascular: negative for chest pain, dyspnea on exertion, edema, orthopnea, palpitations, paroxysmal nocturnal dyspnea or shortness of breath Dermatological: negative for rash Respiratory: negative for cough or wheezing Urologic: negative for hematuria Abdominal: negative for nausea, vomiting, diarrhea, bright red blood per rectum, melena, or hematemesis Neurologic: negative for visual changes, syncope, or dizziness All other systems reviewed and are otherwise negative except as noted above.    Blood pressure 136/82, pulse 78, height 5' (1.524 m), weight 171 lb 6.4 oz (77.747 kg).  General  appearance: alert, cooperative, no distress and moderately obese Lungs: clear to auscultation bilaterally Heart: regular rate and rhythm  EKG NSR, PVC  ASSESSMENT AND PLAN:   Chest pain with minimal risk for cardiac etiology Admitted 12/27/13- MI r/o, echo WNL but she has had recurrent chest discomfort.  Coronary artery disease, non-occlusive Roughly 40% RCA in 2006.; Negative Myoview in June 2013  Hypertensive cardiovascular disease- mild LVH, grade 1 diastolic dysfunction B/P controlled  Hyperlipidemia On statin  Breast cancer Lt mastectomy Oct 2011   PLAN  Im still concerned about her symptoms. She did have some CAD 10 yrs ago at cath. I have ordered a Myoview. Her primary care suggested she increase her Norvasc to 5 mg daily and I agree with that. I also provided an Rx for NTG sl prn. If her Myoview is low risk she can follow up in 6 months.   Isamar Nazir KPA-C 01/05/2014 12:22 PM

## 2014-01-05 NOTE — Assessment & Plan Note (Addendum)
Admitted 12/27/13- MI r/o, echo WNL but she has had recurrent chest discomfort.

## 2014-01-07 ENCOUNTER — Telehealth (HOSPITAL_COMMUNITY): Payer: Self-pay

## 2014-01-07 NOTE — Telephone Encounter (Signed)
Encounter complete. 

## 2014-01-08 ENCOUNTER — Other Ambulatory Visit: Payer: Self-pay | Admitting: *Deleted

## 2014-01-08 MED ORDER — PANTOPRAZOLE SODIUM 40 MG PO TBEC: 40.0000 mg | DELAYED_RELEASE_TABLET | Freq: Every day | ORAL | Status: DC

## 2014-01-12 ENCOUNTER — Ambulatory Visit (HOSPITAL_COMMUNITY)
Admission: RE | Admit: 2014-01-12 | Discharge: 2014-01-12 | Disposition: A | Discharge status: Home / Self Care | Payer: Medicare Other | Source: Ambulatory Visit | Attending: Internal Medicine | Admitting: Internal Medicine

## 2014-01-12 DIAGNOSIS — I119 Hypertensive heart disease without heart failure: Secondary | ICD-10-CM | POA: Diagnosis not present

## 2014-01-12 DIAGNOSIS — R079 Chest pain, unspecified: Secondary | ICD-10-CM | POA: Diagnosis not present

## 2014-01-12 MED ORDER — TECHNETIUM TC 99M SESTAMIBI GENERIC - CARDIOLITE
10.8000 | Freq: Once | INTRAVENOUS | Status: AC | PRN
  Administered 2014-01-12: 11 via INTRAVENOUS

## 2014-01-12 MED ORDER — TECHNETIUM TC 99M SESTAMIBI GENERIC - CARDIOLITE
30.4000 | Freq: Once | INTRAVENOUS | Status: AC | PRN
  Administered 2014-01-12: 30 via INTRAVENOUS

## 2014-01-12 MED ORDER — REGADENOSON 0.4 MG/5ML IV SOLN
0.4000 mg | Freq: Once | INTRAVENOUS | Status: AC
  Administered 2014-01-12: 0.4 mg via INTRAVENOUS

## 2014-01-12 NOTE — Procedures (Addendum)
[facility] [address] [place] [address] [phone]  Cardiology Nuclear Med Study  Angelica [name] is a 80 y.o. female     MRN : [id number]     DOB: [birth year]  Procedure Date: 01/12/2014  Nuclear Med Background Indication for Stress Test:  Evaluation for Ischemia and Post Hospital History:  CAD;MI-1973;2006 CATH --40% RCA;Last NUC MPI on 11/21/2011-nonischemic;ECHO --EF=50-55% Cardiac Risk Factors: Family History - CAD, Hypertension, Lipids and Overweight  Symptoms:  Chest Pain, Dizziness, DOE, Fatigue, Light-Headedness, Near Syncope, Palpitations and SOB   Nuclear Pre-Procedure Caffeine/Decaff Intake:  9:00pm NPO After: 7:00am   IV Site: R Forearm  IV 0.9% NS with Angio Cath:  22g  Chest Size (in):  n/a IV Started by: Rolene Course, RN  Height: 5' (1.524 m)  Cup Size: D;Pt is s/p radiacal Left sided mastectomy;No sticks on left side;Pt does have cup size-D on right side.  BMI:  Body mass index is 33.4 kg/(m^2). Weight:  171 lb (77.565 kg)   Tech Comments:  NO STICKS LEFT SIDE     Nuclear Med Study 1 or 2 day study: 1 day  Stress Test Type:  [place]  Order Authorizing Provider:  Glenetta Hew, MD   Resting Radionuclide: Technetium 39m Sestamibi  Resting Radionuclide Dose: 10.8 mCi   Stress Radionuclide:  Technetium 52m Sestamibi  Stress Radionuclide Dose: 30.4 mCi           Stress Protocol Rest HR: 72 Stress HR: 99  Rest BP: 155/100 Stress BP: 176/100  Exercise Time (min): n/a METS: n/a   Predicted Max HR: 141 bpm % Max HR: 70.21 bpm Rate Pressure Product: 17424  Dose of Adenosine (mg):  n/a Dose of Lexiscan: 0.4 mg  Dose of Atropine (mg): n/a Dose of Dobutamine: n/a mcg/kg/min (at max HR)  Stress Test Technologist: Leane Para, CCT Nuclear Technologist: Imagene Riches, CNMT   Rest Procedure:  Myocardial perfusion imaging was performed at rest 45 minutes following the intravenous administration of  Technetium 59m Sestamibi. Stress Procedure:  The patient received IV Lexiscan 0.4 mg over 15-seconds.  Technetium 19m Sestamibi injected at 30-seconds.  There were no significant changes with Lexiscan.  Quantitative spect images were obtained after a 45 minute delay.  Transient Ischemic Dilatation (Normal <1.22):  0.98  QGS EDV:  117 ml QGS ESV:  60 ml LV Ejection Fraction: 48%     Rest ECG: NSR - Normal EKG  Stress ECG: There are scattered PVCs.  QPS Raw Data Images:  Normal; no motion artifact; normal heart/lung ratio. Stress Images:  there is a very small area of reduced uptake in the anteroapical wall (SSS 4) Rest Images:  Comparison with the stress images reveals no significant change. Subtraction (SDS):  No evidence of ischemia. LV Wall Motion:  Normal Wall Motion or Mildly depressed LV systolic function, EF 60%  Impression Exercise Capacity:  Lexiscan with no exercise. BP Response:  Normal blood pressure response. Clinical Symptoms:  No significant symptoms noted. ECG Impression:  No significant ST segment change suggestive of ischemia. Comparison with Prior Nuclear Study: No images to compare   Overall Impression:  Low risk stress nuclear study with a very small fixed anteroapical defect. There is no reversible ischemia. Mildly depressed overall LV systolic function.Angelica Klein, MD  01/12/2014 12:47 PM

## 2014-01-22 ENCOUNTER — Encounter: Payer: Self-pay | Admitting: Cardiovascular Disease

## 2014-02-08 ENCOUNTER — Telehealth: Payer: Self-pay | Admitting: Cardiology

## 2014-02-08 NOTE — Telephone Encounter (Signed)
Pt had stress test on 8-4,never received the results.

## 2014-02-08 NOTE — Telephone Encounter (Signed)
Spoke with pt, aware of nuclear results 

## 2014-02-10 ENCOUNTER — Other Ambulatory Visit: Payer: Self-pay | Admitting: *Deleted

## 2014-02-10 MED ORDER — PANTOPRAZOLE SODIUM 40 MG PO TBEC: 40.0000 mg | DELAYED_RELEASE_TABLET | Freq: Every day | ORAL | Status: DC

## 2014-02-10 NOTE — Telephone Encounter (Signed)
Rx was sent to pharmacy electronically. 

## 2014-03-07 IMAGING — CR DG CHEST 2V
2 series · 2 of 2 positions shown · non-contrast
Comparison: 12/30/2006.

CLINICAL DATA: Chest pain 2-3 weeks.  History breast cancer and
hypertension.

CHEST - 2 VIEW

[view not recorded (1 of 2)]
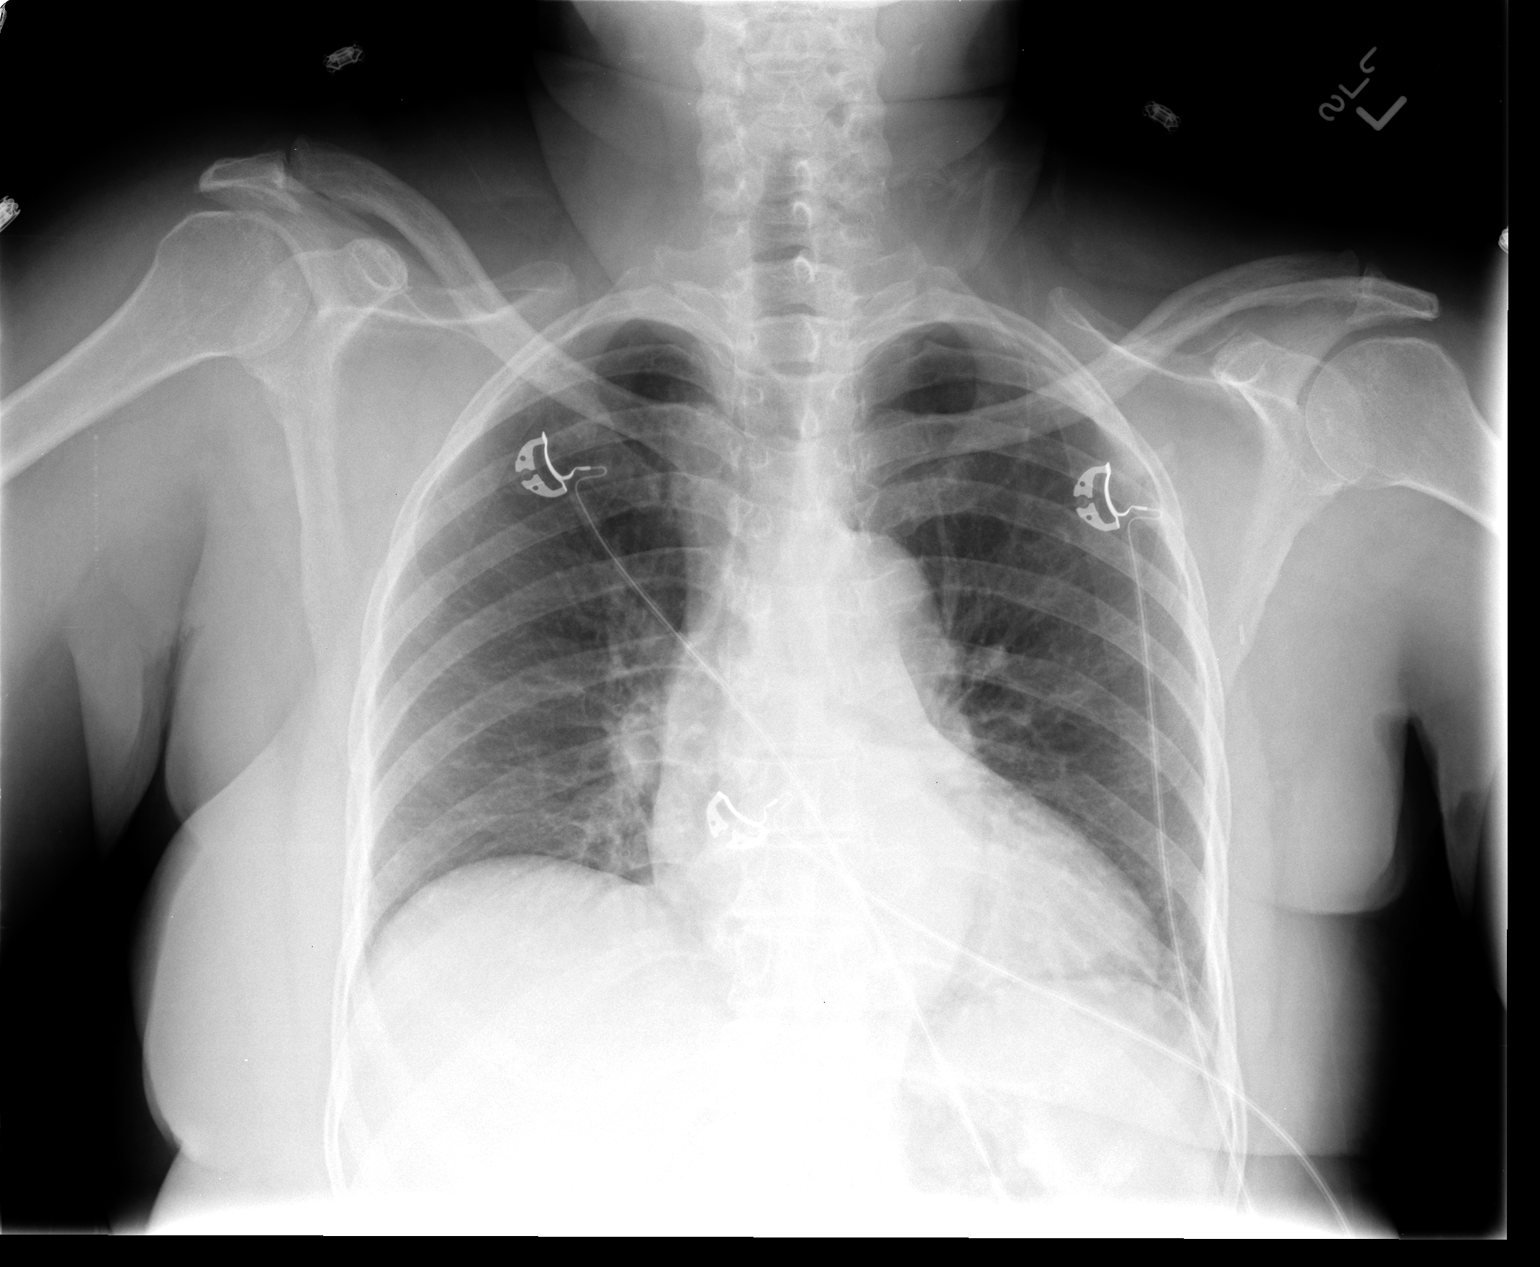

[view not recorded (2 of 2)]
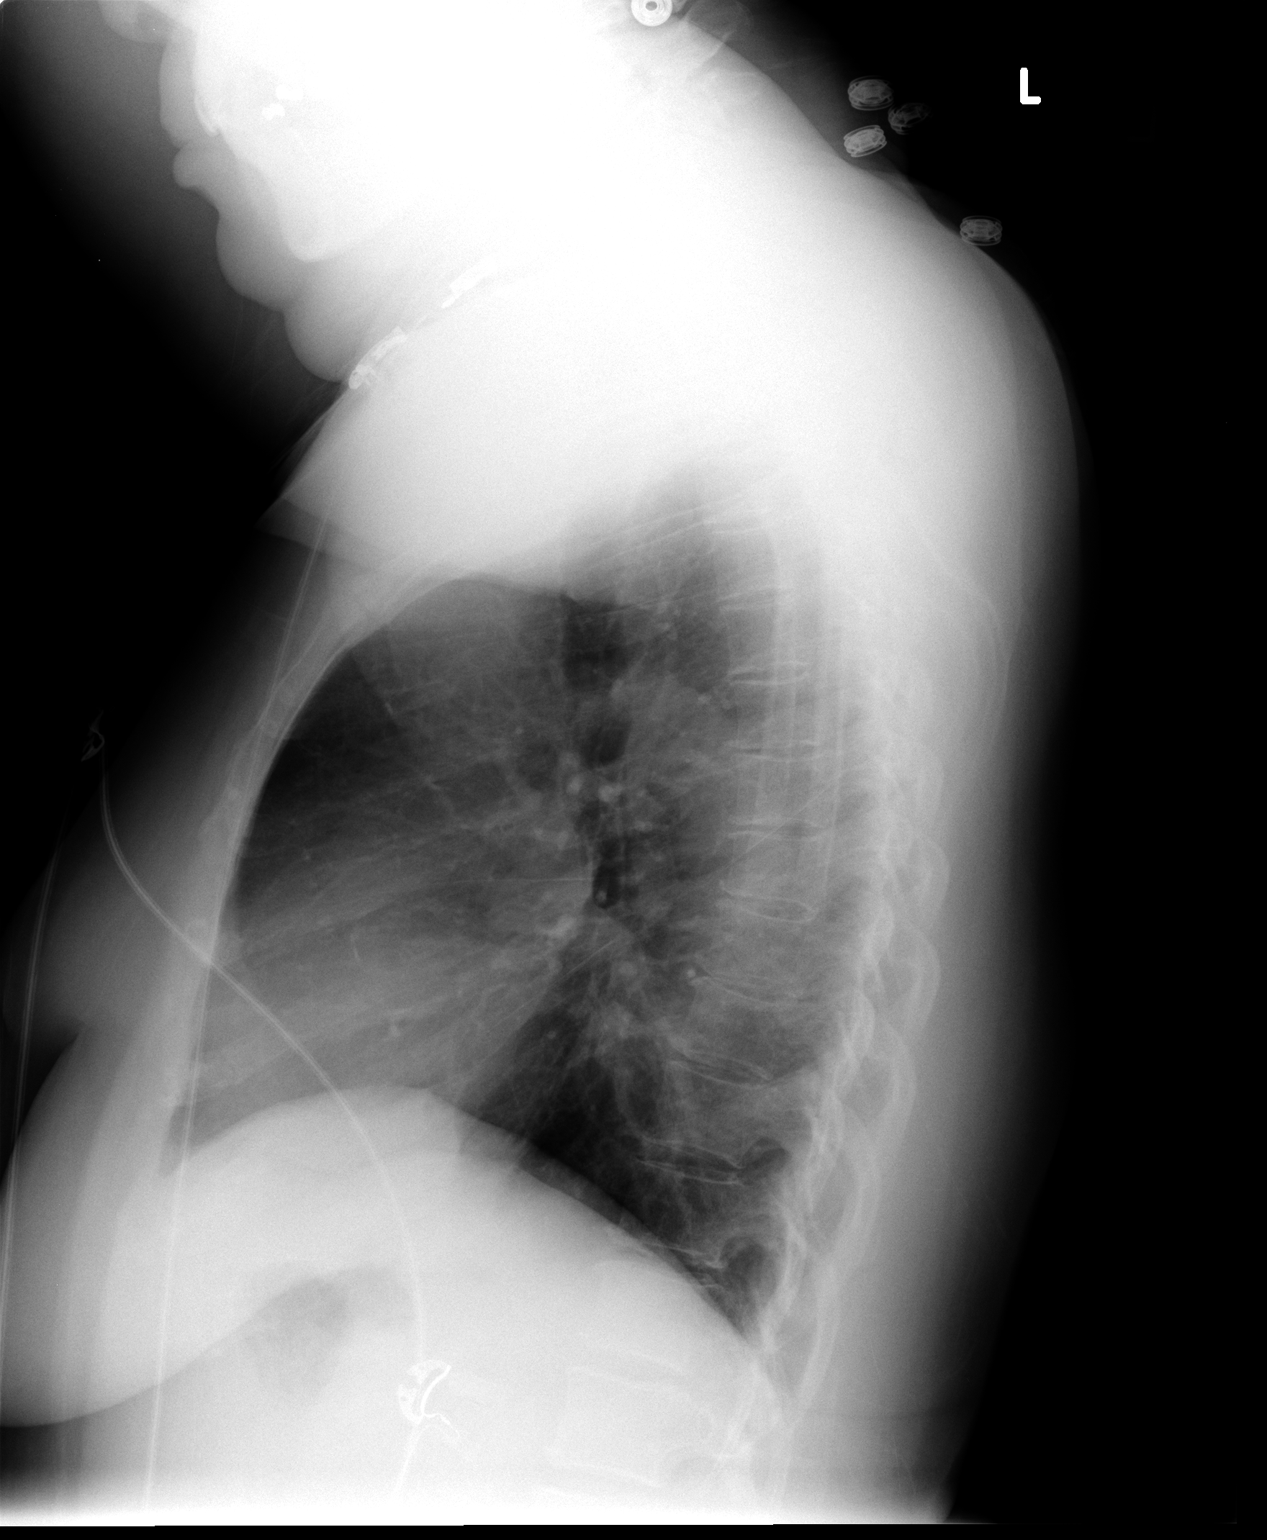

[2 of 2 positions shown; findings below may reference images not displayed]

FINDINGS: There are no infiltrates or edematous changes.  There is
mild cardiomegaly.  There is no evidence for mediastinal or hilar
adenopathy.  The osseous structures are unremarkable.
IMPRESSION: Mild cardiomegaly.  No acute findings.

## 2014-04-08 DIAGNOSIS — Z23 Encounter for immunization: Secondary | ICD-10-CM | POA: Diagnosis not present

## 2014-05-10 DIAGNOSIS — M1991 Primary osteoarthritis, unspecified site: Secondary | ICD-10-CM | POA: Diagnosis not present

## 2014-05-10 DIAGNOSIS — E6609 Other obesity due to excess calories: Secondary | ICD-10-CM | POA: Diagnosis not present

## 2014-05-10 DIAGNOSIS — Z6832 Body mass index (BMI) 32.0-32.9, adult: Secondary | ICD-10-CM | POA: Diagnosis not present

## 2014-05-10 DIAGNOSIS — M2550 Pain in unspecified joint: Secondary | ICD-10-CM | POA: Diagnosis not present

## 2014-05-14 DIAGNOSIS — E063 Autoimmune thyroiditis: Secondary | ICD-10-CM | POA: Diagnosis not present

## 2014-05-14 DIAGNOSIS — E038 Other specified hypothyroidism: Secondary | ICD-10-CM | POA: Diagnosis not present

## 2014-05-14 DIAGNOSIS — E042 Nontoxic multinodular goiter: Secondary | ICD-10-CM | POA: Diagnosis not present

## 2014-05-21 DIAGNOSIS — E063 Autoimmune thyroiditis: Secondary | ICD-10-CM | POA: Diagnosis not present

## 2014-05-21 DIAGNOSIS — I10 Essential (primary) hypertension: Secondary | ICD-10-CM | POA: Diagnosis not present

## 2014-05-21 DIAGNOSIS — E785 Hyperlipidemia, unspecified: Secondary | ICD-10-CM | POA: Diagnosis not present

## 2014-05-21 DIAGNOSIS — E042 Nontoxic multinodular goiter: Secondary | ICD-10-CM | POA: Diagnosis not present

## 2014-05-21 DIAGNOSIS — E039 Hypothyroidism, unspecified: Secondary | ICD-10-CM | POA: Diagnosis not present

## 2014-05-21 DIAGNOSIS — Z6831 Body mass index (BMI) 31.0-31.9, adult: Secondary | ICD-10-CM | POA: Diagnosis not present

## 2014-06-09 DIAGNOSIS — E063 Autoimmune thyroiditis: Secondary | ICD-10-CM | POA: Diagnosis not present

## 2014-06-09 DIAGNOSIS — J209 Acute bronchitis, unspecified: Secondary | ICD-10-CM | POA: Diagnosis not present

## 2014-06-09 DIAGNOSIS — E782 Mixed hyperlipidemia: Secondary | ICD-10-CM | POA: Diagnosis not present

## 2014-06-09 DIAGNOSIS — J9801 Acute bronchospasm: Secondary | ICD-10-CM | POA: Diagnosis not present

## 2014-06-09 DIAGNOSIS — J0100 Acute maxillary sinusitis, unspecified: Secondary | ICD-10-CM | POA: Diagnosis not present

## 2014-06-09 DIAGNOSIS — Z6831 Body mass index (BMI) 31.0-31.9, adult: Secondary | ICD-10-CM | POA: Diagnosis not present

## 2014-06-21 ENCOUNTER — Other Ambulatory Visit (HOSPITAL_COMMUNITY): Payer: Self-pay

## 2014-06-22 ENCOUNTER — Encounter (HOSPITAL_BASED_OUTPATIENT_CLINIC_OR_DEPARTMENT_OTHER): Payer: Medicare Other

## 2014-06-22 ENCOUNTER — Encounter (HOSPITAL_COMMUNITY): Payer: Self-pay | Admitting: Hematology & Oncology

## 2014-06-22 ENCOUNTER — Encounter (HOSPITAL_COMMUNITY): Payer: Medicare Other | Attending: Hematology & Oncology | Admitting: Hematology & Oncology

## 2014-06-22 DIAGNOSIS — Z08 Encounter for follow-up examination after completed treatment for malignant neoplasm: Secondary | ICD-10-CM | POA: Insufficient documentation

## 2014-06-22 DIAGNOSIS — C50912 Malignant neoplasm of unspecified site of left female breast: Secondary | ICD-10-CM

## 2014-06-22 DIAGNOSIS — Z853 Personal history of malignant neoplasm of breast: Secondary | ICD-10-CM

## 2014-06-22 LAB — COMPREHENSIVE METABOLIC PANEL
ALT: 25 U/L (ref 0–35)
AST: 24 U/L (ref 0–37)
Albumin: 4 g/dL (ref 3.5–5.2)
Alkaline Phosphatase: 68 U/L (ref 39–117)
Anion gap: 6 (ref 5–15)
BUN: 17 mg/dL (ref 6–23)
CALCIUM: 9.2 mg/dL (ref 8.4–10.5)
CO2: 33 mmol/L — ABNORMAL HIGH (ref 19–32)
Chloride: 102 mEq/L (ref 96–112)
Creatinine, Ser: 0.75 mg/dL (ref 0.50–1.10)
GFR calc non Af Amer: 78 mL/min — ABNORMAL LOW (ref >90)
GLUCOSE: 103 mg/dL — AB (ref 70–99)
Potassium: 4.3 mmol/L (ref 3.5–5.1)
Sodium: 141 mmol/L (ref 135–145)
Total Bilirubin: 1.3 mg/dL — ABNORMAL HIGH (ref 0.3–1.2)
Total Protein: 7.7 g/dL (ref 6.0–8.3)

## 2014-06-22 LAB — CBC WITH DIFFERENTIAL/PLATELET
BASOS ABS: 0.1 10*3/uL (ref 0.0–0.1)
Basophils Relative: 1 % (ref 0–1)
Eosinophils Absolute: 0.1 10*3/uL (ref 0.0–0.7)
Eosinophils Relative: 1 % (ref 0–5)
HCT: 37.9 % (ref 36.0–46.0)
Hemoglobin: 12 g/dL (ref 12.0–15.0)
Lymphocytes Relative: 22 % (ref 12–46)
Lymphs Abs: 1.9 10*3/uL (ref 0.7–4.0)
MCH: 31.2 pg (ref 26.0–34.0)
MCHC: 31.7 g/dL (ref 30.0–36.0)
MCV: 98.4 fL (ref 78.0–100.0)
Monocytes Absolute: 0.5 10*3/uL (ref 0.1–1.0)
Monocytes Relative: 6 % (ref 3–12)
NEUTROS ABS: 5.9 10*3/uL (ref 1.7–7.7)
NEUTROS PCT: 70 % (ref 43–77)
PLATELETS: 204 10*3/uL (ref 150–400)
RBC: 3.85 MIL/uL — ABNORMAL LOW (ref 3.87–5.11)
RDW: 14.7 % (ref 11.5–15.5)
WBC: 8.5 10*3/uL (ref 4.0–10.5)

## 2014-06-22 NOTE — Patient Instructions (Signed)
[place] at [facility]  Discharge Instructions:    We will see you back in 6 months with labs. Please call in the interim with any problems or concerns. We will give you a prescription for your mammogram  I would like to check your liver function tests in 6 weeks. ______________________________________________________________  Thank you for choosing [place] at [facility] to provide your oncology and hematology care.  To afford each patient quality time with our providers, please arrive at least 15 minutes before your scheduled appointment.  You need to re-schedule your appointment if you arrive 10 or more minutes late.  We strive to give you quality time with our providers, and arriving late affects you and other patients whose appointments are after yours.  Also, if you no show three or more times for appointments you may be dismissed from the clinic.  Again, thank you for choosing [place] at [place] hope is that these requests will allow you access to exceptional care and in a timely manner. _______________________________________________________________  If you have questions after your visit, please contact our office at [phone] between the hours of 8:30 a.m. and 5:00 p.m. Voicemails left after 4:30 p.m. will not be returned until the following business day. _______________________________________________________________  For prescription refill requests, have your pharmacy contact our office. _______________________________________________________________  Recommendations made by the consultant and any test results will be sent to your referring physician. _______________________________________________________________

## 2014-06-22 NOTE — Progress Notes (Unsigned)
Labs for [id number],cbcd

## 2014-06-22 NOTE — Progress Notes (Signed)
Angelica [name]., MD [place] [address]  Multicentric stage I left breast cancer, triple negative. left total mastectomy with sentinel node sampling on 10/11/ 2011 at which time 0.4 cm and 0.2 cm tumors were found, both triple negative. She received no postoperative therapy.  10/21/2013 R mammogram at [facility], Category 1  CURRENT THERAPY: Observation  INTERVAL HISTORY: Angelica [name] 81 y.o. female returns for follow-up of a stage I triple negative breast cancer. She denies any problems or concerns. She denies lymphedema. She does lymphedema exercises every morning.  Her appetite is good.  MEDICAL HISTORY: Past Medical History  Diagnosis Date  . HTN (hypertension)   . Hypothyroid   . Hyperlipidemia   . Anemia   . Gout   . Colon polyps     Previous colonoscopy by Dr. Irving Shows  . Breast cancer 2011    left; no adjuvent therapy, Dr. Sonny Dandy  . Arthritis   . Chronic bronchitis   . Diverticulosis of colon   . Hemorrhoids   . MI (myocardial infarction) 1973    By report.  . Coronary artery disease, non-occlusive 2006    Roughly 40% RCA.; Negative Myoview in June 2013    has Diverticulosis of colon; Diverticulitis of colon; Breast cancer; Personal history of colonic polyps; LLQ pain; Rectal bleed; Diarrhea; Chest pain with minimal risk for cardiac etiology; Hypertensive cardiovascular disease- mild LVH, grade 1 diastolic dysfunction; Hypokalemia; Hypothyroidism; Hyperlipidemia; Obesity (BMI 30-39.9); Coronary artery disease, non-occlusive; Bilateral lower extremity edema; Chest pain; and ACS (acute coronary syndrome) on her problem list.     No history exists.     is allergic to bee venom; ciprofloxacin; clindamycin hcl; flagyl; shellfish-derived products; and sulfa antibiotics.  Ms. [name] does not currently have medications on file.  SURGICAL HISTORY: Past Surgical History  Procedure Laterality Date  . Abdominal hysterectomy      TAH  .  Cholecystectomy    . Hemorrhoid surgery    . Colonoscopy       ?3 years ago. Per pt, diverticulosis, hemorrhoids  . Colonoscopy  04/06/2011    Procedure: COLONOSCOPY;  Surgeon: Dorothyann Peng, MD;  Location: [facility];  Service: Endoscopy;  Laterality: N/A;  9:30  . Mastectomy  2011    left  . Cataract extraction w/phaco  07/02/2011    Procedure: CATARACT EXTRACTION PHACO AND INTRAOCULAR LENS PLACEMENT (IOC);  Surgeon: Williams Che, MD;  Location: [facility] ORS;  Service: Ophthalmology;  Laterality: Left;  CDE:3.45  . Cataract extraction w/phaco  04/17/2012    Procedure: CATARACT EXTRACTION PHACO AND INTRAOCULAR LENS PLACEMENT (IOC);  Surgeon: Tonny Branch, MD;  Location: [facility] ORS;  Service: Ophthalmology;  Laterality: Right;  CDE:13.40  . Cardiac catheterization  2006    Roughly 40% RCA lesion, otherwise normal.  . Nm myoview ltd  June 2013    No ischemia or infarction. Normal EF  . Transthoracic echocardiogram  June 2013    Normal EF, no significant valve disease.  . Laparoscopic lysis of adhesions  2008    Dr. Zella Richer with repair of incisional hernia noted -     SOCIAL HISTORY: History   Social History  . Marital Status: Married    Spouse Name: N/A    Number of Children: 2  . Years of Education: N/A   Occupational History  . retired    Social History Main Topics  . Smoking status: Never Smoker   . Smokeless tobacco: Never Used  . Alcohol Use:  No  . Drug Use: No  . Sexual Activity: Not Currently   Other Topics Concern  . Not on file   Social History Narrative   She is a very pleasant married African American woman with 2 children and 3 grandchildren. She does with her husband who is retired Social research officer, government. They now live on a large farm in the [place] area with a roughly 1/4 mile driveway. She usually walks a mile about twice a day, one by herself and 1 with her husband. She also enjoys doing gardening.    FAMILY HISTORY: Family History  Problem Relation Age of Onset    . Prostate cancer Father   . Breast cancer Sister   . Colon cancer Neg Hx   . Anesthesia problems Neg Hx   . Hypotension Neg Hx   . Malignant hyperthermia Neg Hx   . Pseudochol deficiency Neg Hx     Review of Systems  Constitutional: Negative for fever, chills, weight loss and malaise/fatigue.  HENT: Negative for congestion, hearing loss, nosebleeds, sore throat and tinnitus.   Eyes: Negative for blurred vision, double vision, pain and discharge.  Respiratory: Negative for cough, hemoptysis, sputum production, shortness of breath and wheezing.   Cardiovascular: Negative for chest pain, palpitations, claudication, leg swelling and PND.  Gastrointestinal: Negative for heartburn, nausea, vomiting, abdominal pain, diarrhea, constipation, blood in stool and melena.  Genitourinary: Negative for dysuria, urgency, frequency and hematuria.  Musculoskeletal: Positive for back pain and joint pain. Negative for myalgias, falls and neck pain.  Skin: Negative for itching and rash.  Neurological: Negative for dizziness, tingling, tremors, sensory change, speech change, focal weakness, seizures, loss of consciousness, weakness and headaches.  Endo/Heme/Allergies: Does not bruise/bleed easily.  Psychiatric/Behavioral: Negative for depression, suicidal ideas, memory loss and substance abuse. The patient is not nervous/anxious and does not have insomnia.     PHYSICAL EXAMINATION  ECOG PERFORMANCE STATUS: 0 - Asymptomatic  Filed Vitals:   06/22/14 1100  BP: 165/86  Pulse: 78  Temp: 97.8 F (36.6 C)  Resp: 18    Physical Exam  Constitutional: She is oriented to person, place, and time and well-developed, well-nourished, and in no distress.  HENT:  Head: Normocephalic and atraumatic.  Nose: Nose normal.  Mouth/Throat: Oropharynx is clear and moist. No oropharyngeal exudate.  Eyes: Conjunctivae and EOM are normal. Pupils are equal, round, and reactive to light. Right eye exhibits no discharge.  Left eye exhibits no discharge. No scleral icterus.  Neck: Normal range of motion. Neck supple. No tracheal deviation present. No thyromegaly present.  Cardiovascular: Normal rate, regular rhythm and normal heart sounds.  Exam reveals no gallop and no friction rub.   No murmur heard. Pulmonary/Chest: Effort normal and breath sounds normal. She has no wheezes. She has no rales.  R breast unremarkable. Both axillae clear. L incision is well healed scar with no palpable abnormality, nodularity or suspicious skin changes.  Abdominal: Soft. Bowel sounds are normal. She exhibits no distension and no mass. There is no tenderness. There is no rebound and no guarding.  Musculoskeletal: Normal range of motion. She exhibits no edema.  Lymphadenopathy:    She has no cervical adenopathy.  Neurological: She is alert and oriented to person, place, and time. She has normal reflexes. No cranial nerve deficit. Gait normal. Coordination normal.  Skin: Skin is warm and dry. No rash noted.  Psychiatric: Mood, memory, affect and judgment normal.  Nursing note and vitals reviewed.   LABORATORY DATA:  CBC  Component Value Date/Time   WBC 8.5 06/22/2014 1019   RBC 3.85* 06/22/2014 1019   HGB 12.0 06/22/2014 1019   HCT 37.9 06/22/2014 1019   PLT 204 06/22/2014 1019   MCV 98.4 06/22/2014 1019   MCH 31.2 06/22/2014 1019   MCHC 31.7 06/22/2014 1019   RDW 14.7 06/22/2014 1019   LYMPHSABS 1.9 06/22/2014 1019   MONOABS 0.5 06/22/2014 1019   EOSABS 0.1 06/22/2014 1019   BASOSABS 0.1 06/22/2014 1019   CMP     Component Value Date/Time   NA 141 06/22/2014 1019   K 4.3 06/22/2014 1019   CL 102 06/22/2014 1019   CO2 33* 06/22/2014 1019   GLUCOSE 103* 06/22/2014 1019   BUN 17 06/22/2014 1019   CREATININE 0.75 06/22/2014 1019   CREATININE 0.76 03/15/2011 1413   CALCIUM 9.2 06/22/2014 1019   PROT 7.7 06/22/2014 1019   ALBUMIN 4.0 06/22/2014 1019   AST 24 06/22/2014 1019   ALT 25 06/22/2014 1019    ALKPHOS 68 06/22/2014 1019   BILITOT 1.3* 06/22/2014 1019   GFRNONAA 78* 06/22/2014 1019   GFRAA >90 06/22/2014 1019      ASSESSMENT and THERAPY PLAN:    Breast cancer Pleasant 81 year old female with a history of multicentric stage I carcinoma of the left breast. Disease was triple negative. She underwent a left total mastectomy with sentinel node procedure on 03/21/2010. Both tumors were small. She received no postoperative therapy. She is due again for a right mammogram in May. She does these at [facility] and we have provided her with a prescription. She has also requested a new lymphedema sleeve. She currently has no problems with lymphedema and she diligently does lymphedema exercises each morning. Breast exam today was unremarkable.  We will see her back in 6 months at which time we can discuss moving her visits out to annually. Her bilirubin was mildly elevated today and at a prior visit. I recommended rechecking that in 6 weeks' time. We will notify her of the results. I advised her to call with any problems or concerns in the interim.    All questions were answered. The patient knows to call the clinic with any problems, questions or concerns. We can certainly see the patient much sooner if necessary.   Molli Hazard 06/22/2014

## 2014-06-22 NOTE — Assessment & Plan Note (Signed)
Pleasant 81 year old female with a history of multicentric stage I carcinoma of the left breast. Disease was triple negative. She underwent a left total mastectomy with sentinel node procedure on 03/21/2010. Both tumors were small. She received no postoperative therapy. She is due again for a right mammogram in May. She does these at [facility] [place] and we have provided her with a prescription. She has also requested a new lymphedema sleeve. She currently has no problems with lymphedema and she diligently does lymphedema exercises each morning. Breast exam today was unremarkable.  We will see her back in 6 months at which time we can discuss moving her visits out to annually. Her bilirubin was mildly elevated today and at a prior visit. I recommended rechecking that in 6 weeks' time. We will notify her of the results. I advised her to call with any problems or concerns in the interim.

## 2014-06-23 LAB — CANCER ANTIGEN 27.29: CA 27.29: 27 U/mL (ref 0–39)

## 2014-07-13 ENCOUNTER — Other Ambulatory Visit: Payer: Self-pay | Admitting: Cardiovascular Disease

## 2014-07-14 NOTE — Telephone Encounter (Signed)
Rx refill sent to patient pharmacy   

## 2014-08-02 ENCOUNTER — Other Ambulatory Visit (HOSPITAL_COMMUNITY): Payer: Self-pay

## 2014-08-02 DIAGNOSIS — R945 Abnormal results of liver function studies: Principal | ICD-10-CM

## 2014-08-02 DIAGNOSIS — R7989 Other specified abnormal findings of blood chemistry: Secondary | ICD-10-CM

## 2014-08-03 ENCOUNTER — Encounter (HOSPITAL_COMMUNITY): Payer: Medicare Other | Attending: Hematology & Oncology

## 2014-08-03 DIAGNOSIS — Z853 Personal history of malignant neoplasm of breast: Secondary | ICD-10-CM | POA: Insufficient documentation

## 2014-08-03 DIAGNOSIS — Z08 Encounter for follow-up examination after completed treatment for malignant neoplasm: Secondary | ICD-10-CM | POA: Diagnosis not present

## 2014-08-03 LAB — HEPATIC FUNCTION PANEL
ALK PHOS: 62 U/L (ref 39–117)
ALT: 16 U/L (ref 0–35)
AST: 20 U/L (ref 0–37)
Albumin: 4.1 g/dL (ref 3.5–5.2)
BILIRUBIN INDIRECT: 1.5 mg/dL — AB (ref 0.3–0.9)
BILIRUBIN TOTAL: 1.7 mg/dL — AB (ref 0.3–1.2)
Bilirubin, Direct: 0.2 mg/dL (ref 0.0–0.5)
Total Protein: 8 g/dL (ref 6.0–8.3)

## 2014-08-03 NOTE — Progress Notes (Signed)
Labs for hfp 

## 2014-09-29 DIAGNOSIS — H903 Sensorineural hearing loss, bilateral: Secondary | ICD-10-CM | POA: Diagnosis not present

## 2014-10-25 DIAGNOSIS — Z1231 Encounter for screening mammogram for malignant neoplasm of breast: Secondary | ICD-10-CM | POA: Diagnosis not present

## 2014-10-25 DIAGNOSIS — Z9012 Acquired absence of left breast and nipple: Secondary | ICD-10-CM | POA: Diagnosis not present

## 2014-10-26 ENCOUNTER — Telehealth (HOSPITAL_COMMUNITY): Payer: Self-pay

## 2014-10-26 NOTE — Telephone Encounter (Signed)
Notified that mammogram done 10/25/14 was negative.

## 2014-11-11 ENCOUNTER — Other Ambulatory Visit: Payer: Self-pay | Admitting: Cardiology

## 2014-11-11 NOTE — Telephone Encounter (Signed)
Rx(s) sent to pharmacy electronically.  

## 2014-11-23 ENCOUNTER — Ambulatory Visit: Payer: Medicare Other | Admitting: Cardiology

## 2014-12-22 ENCOUNTER — Encounter (HOSPITAL_BASED_OUTPATIENT_CLINIC_OR_DEPARTMENT_OTHER): Payer: Medicare Other

## 2014-12-22 ENCOUNTER — Encounter (HOSPITAL_COMMUNITY): Payer: Medicare Other | Attending: Hematology & Oncology | Admitting: Hematology & Oncology

## 2014-12-22 ENCOUNTER — Encounter (HOSPITAL_COMMUNITY): Payer: Self-pay | Admitting: Hematology & Oncology

## 2014-12-22 ENCOUNTER — Other Ambulatory Visit (HOSPITAL_COMMUNITY): Payer: Medicare Other

## 2014-12-22 DIAGNOSIS — Z853 Personal history of malignant neoplasm of breast: Secondary | ICD-10-CM

## 2014-12-22 DIAGNOSIS — C50912 Malignant neoplasm of unspecified site of left female breast: Secondary | ICD-10-CM | POA: Insufficient documentation

## 2014-12-22 LAB — COMPREHENSIVE METABOLIC PANEL
ALT: 16 U/L (ref 14–54)
AST: 20 U/L (ref 15–41)
Albumin: 4.3 g/dL (ref 3.5–5.0)
Alkaline Phosphatase: 65 U/L (ref 38–126)
Anion gap: 9 (ref 5–15)
BILIRUBIN TOTAL: 1.7 mg/dL — AB (ref 0.3–1.2)
BUN: 16 mg/dL (ref 6–20)
CO2: 31 mmol/L (ref 22–32)
Calcium: 8.8 mg/dL — ABNORMAL LOW (ref 8.9–10.3)
Chloride: 98 mmol/L — ABNORMAL LOW (ref 101–111)
Creatinine, Ser: 0.72 mg/dL (ref 0.44–1.00)
GFR calc non Af Amer: >60 mL/min (ref >60)
GLUCOSE: 103 mg/dL — AB (ref 65–99)
POTASSIUM: 3.8 mmol/L (ref 3.5–5.1)
SODIUM: 138 mmol/L (ref 135–145)
Total Protein: 8 g/dL (ref 6.5–8.1)

## 2014-12-22 LAB — CBC WITH DIFFERENTIAL/PLATELET
Basophils Absolute: 0.1 10*3/uL (ref 0.0–0.1)
Basophils Relative: 1 % (ref 0–1)
EOS ABS: 0.2 10*3/uL (ref 0.0–0.7)
Eosinophils Relative: 2 % (ref 0–5)
HEMATOCRIT: 40.5 % (ref 36.0–46.0)
Hemoglobin: 13.1 g/dL (ref 12.0–15.0)
Lymphocytes Relative: 34 % (ref 12–46)
Lymphs Abs: 2.4 10*3/uL (ref 0.7–4.0)
MCH: 31 pg (ref 26.0–34.0)
MCHC: 32.3 g/dL (ref 30.0–36.0)
MCV: 96 fL (ref 78.0–100.0)
Monocytes Absolute: 0.4 10*3/uL (ref 0.1–1.0)
Monocytes Relative: 6 % (ref 3–12)
NEUTROS ABS: 4.1 10*3/uL (ref 1.7–7.7)
Neutrophils Relative %: 57 % (ref 43–77)
Platelets: 181 10*3/uL (ref 150–400)
RBC: 4.22 MIL/uL (ref 3.87–5.11)
RDW: 14.5 % (ref 11.5–15.5)
WBC: 7.1 10*3/uL (ref 4.0–10.5)

## 2014-12-22 NOTE — Progress Notes (Signed)
Labs drawn

## 2014-12-22 NOTE — Progress Notes (Signed)
Angelica [name]., MD [place] [id number]  Multicentric stage I left breast cancer, triple negative. left total mastectomy with sentinel node sampling on 10/11/ 2011 at which time 0.4 cm and 0.2 cm tumors were found, both triple negative. She received no postoperative therapy.  10/21/2013 R mammogram at [facility], Category 1  CURRENT THERAPY: Observation  INTERVAL HISTORY: Angelica [name] 81 y.o. female returns for follow-up of a stage I triple negative breast cancer. She denies any problems or concerns. She denies lymphedema. She does lymphedema exercises every morning.  Her appetite is good.   The patient is present alone today and says that everything is going well.  She has been very active this summer. She sometimes experiences tenderness under both arms. The patient says that her arm is no worse than before. She has a palpable thickening in the right supraclavicular area.  Her endocrinologist performed an ultrasound on this region in December of 2015. She follows at [facility] in [place].  She has no other concerns at this time.   MEDICAL HISTORY: Past Medical History  Diagnosis Date  . HTN (hypertension)   . Hypothyroid   . Hyperlipidemia   . Anemia   . Gout   . Colon polyps     Previous colonoscopy by Dr. Irving Shows  . Breast cancer 2011    left; no adjuvent therapy, Dr. Sonny Dandy  . Arthritis   . Chronic bronchitis   . Diverticulosis of colon   . Hemorrhoids   . MI (myocardial infarction) 1973    By report.  . Coronary artery disease, non-occlusive 2006    Roughly 40% RCA.; Negative Myoview in June 2013    has Diverticulosis of colon; Diverticulitis of colon; Breast cancer; Personal history of colonic polyps; LLQ pain; Rectal bleed; Diarrhea; Chest pain with minimal risk for cardiac etiology; Hypertensive cardiovascular disease- mild LVH, grade 1 diastolic dysfunction; Hypokalemia; Hypothyroidism; Hyperlipidemia; Obesity (BMI  30-39.9); Coronary artery disease, non-occlusive; Bilateral lower extremity edema; Chest pain; and ACS (acute coronary syndrome) on her problem list.     is allergic to bee venom; ciprofloxacin; clindamycin hcl; flagyl; shellfish-derived products; and sulfa antibiotics.  Angelica [name] does not currently have medications on file.  SURGICAL HISTORY: Past Surgical History  Procedure Laterality Date  . Abdominal hysterectomy      TAH  . Cholecystectomy    . Hemorrhoid surgery    . Colonoscopy       ?3 years ago. Per pt, diverticulosis, hemorrhoids  . Colonoscopy  04/06/2011    Procedure: COLONOSCOPY;  Surgeon: Dorothyann Peng, MD;  Location: [facility];  Service: Endoscopy;  Laterality: N/A;  9:30  . Mastectomy  2011    left  . Cataract extraction w/phaco  07/02/2011    Procedure: CATARACT EXTRACTION PHACO AND INTRAOCULAR LENS PLACEMENT (IOC);  Surgeon: Williams Che, MD;  Location: [facility] ORS;  Service: Ophthalmology;  Laterality: Left;  CDE:3.45  . Cataract extraction w/phaco  04/17/2012    Procedure: CATARACT EXTRACTION PHACO AND INTRAOCULAR LENS PLACEMENT (IOC);  Surgeon: Tonny Branch, MD;  Location: [facility] ORS;  Service: Ophthalmology;  Laterality: Right;  CDE:13.40  . Cardiac catheterization  2006    Roughly 40% RCA lesion, otherwise normal.  . Nm myoview ltd  June 2013    No ischemia or infarction. Normal EF  . Transthoracic echocardiogram  June 2013    Normal EF, no significant valve disease.  . Laparoscopic lysis of adhesions  2008    Dr.  Rosenbower with repair of incisional hernia noted -     SOCIAL HISTORY: History   Social History  . Marital Status: Married    Spouse Name: N/A  . Number of Children: 2  . Years of Education: N/A   Occupational History  . retired    Social History Main Topics  . Smoking status: Never Smoker   . Smokeless tobacco: Never Used  . Alcohol Use: No  . Drug Use: No  . Sexual Activity: Not Currently   Other Topics Concern  . Not on file    Social History Narrative   She is a very pleasant married African American woman with 2 children and 3 grandchildren. She does with her husband who is retired Social research officer, government. They now live on a large farm in the [place] area with a roughly 1/4 mile driveway. She usually walks a mile about twice a day, one by herself and 1 with her husband. She also enjoys doing gardening.    FAMILY HISTORY: Family History  Problem Relation Age of Onset  . Prostate cancer Father   . Breast cancer Sister   . Colon cancer Neg Hx   . Anesthesia problems Neg Hx   . Hypotension Neg Hx   . Malignant hyperthermia Neg Hx   . Pseudochol deficiency Neg Hx     Review of Systems  Constitutional: Negative for fever, chills, weight loss and malaise/fatigue.  HENT: Negative for congestion, hearing loss, nosebleeds, sore throat and tinnitus.   Eyes: Negative for blurred vision, double vision, pain and discharge.  Respiratory: Negative for cough, hemoptysis, sputum production, shortness of breath and wheezing.   Cardiovascular: Negative for chest pain, palpitations, claudication, leg swelling and PND.  Gastrointestinal: Negative for heartburn, nausea, vomiting, abdominal pain, diarrhea, constipation, blood in stool and melena.  Genitourinary: Negative for dysuria, urgency, frequency and hematuria.  Musculoskeletal: Negattive for back pain and joint pain. Negative for myalgias, falls and neck pain.  Skin: Negative for itching and rash.  Neurological: Negative for dizziness, tingling, tremors, sensory change, speech change, focal weakness, seizures, loss of consciousness, weakness and headaches.  Endo/Heme/Allergies: Does not bruise/bleed easily.  Psychiatric/Behavioral: Negative for depression, suicidal ideas, memory loss and substance abuse. The patient is not nervous/anxious and does not have insomnia.   14 point review of systems was performed and is negative except as detailed under history of present illness and  above   PHYSICAL EXAMINATION  ECOG PERFORMANCE STATUS: 0 - Asymptomatic  Filed Vitals:   12/22/14 1050  BP: 163/79  Pulse: 75  Temp: 98.3 F (36.8 C)  Resp: 18    Physical Exam  Constitutional: She is oriented to person, place, and time and well-developed, well-nourished, and in no distress.  HENT:  Head: Normocephalic and atraumatic.  Nose: Nose normal.  Mouth/Throat: Oropharynx is clear and moist. No oropharyngeal exudate.  Eyes: Conjunctivae and EOM are normal. Pupils are equal, round, and reactive to light. Right eye exhibits no discharge. Left eye exhibits no discharge. No scleral icterus.  Neck: Normal range of motion. Neck supple. No tracheal deviation present. No thyromegaly present.  Palpable thickening in the right supraclavicular area, no discrete palpable nodules Cardiovascular: Normal rate, regular rhythm and normal heart sounds.  Exam reveals no gallop and no friction rub.   Systolic ejection murmur Pulmonary/Chest: Effort normal and breath sounds normal. She has no wheezes. She has no rales.  L mastecomy site shows evidence of abnormal scar formation, chronic.  It is discolored in areas. (patient also notes  this is chronic) R breast unremarkable. Both axillae clear. L incision is well healed scar with no palpable abnormality, nodularity or suspicious skin changes.  Abdominal: Soft. Bowel sounds are normal. She exhibits no distension and no mass. There is no tenderness. There is no rebound and no guarding.  Musculoskeletal: Normal range of motion. She exhibits no edema.  Lymphadenopathy:    She has no cervical adenopathy.  Neurological: She is alert and oriented to person, place, and time. She has normal reflexes. No cranial nerve deficit. Gait normal. Coordination normal.  Skin: Skin is warm and dry. No rash noted.  Psychiatric: Mood, memory, affect and judgment normal.  Nursing note and vitals reviewed.   LABORATORY DATA:  CBC    Component Value Date/Time    WBC 7.1 12/22/2014 1135   RBC 4.22 12/22/2014 1135   HGB 13.1 12/22/2014 1135   HCT 40.5 12/22/2014 1135   PLT 181 12/22/2014 1135   MCV 96.0 12/22/2014 1135   MCH 31.0 12/22/2014 1135   MCHC 32.3 12/22/2014 1135   RDW 14.5 12/22/2014 1135   LYMPHSABS 2.4 12/22/2014 1135   MONOABS 0.4 12/22/2014 1135   EOSABS 0.2 12/22/2014 1135   BASOSABS 0.1 12/22/2014 1135   CMP     Component Value Date/Time   NA 138 12/22/2014 1135   K 3.8 12/22/2014 1135   CL 98* 12/22/2014 1135   CO2 31 12/22/2014 1135   GLUCOSE 103* 12/22/2014 1135   BUN 16 12/22/2014 1135   CREATININE 0.72 12/22/2014 1135   CREATININE 0.76 03/15/2011 1413   CALCIUM 8.8* 12/22/2014 1135   PROT 8.0 12/22/2014 1135   ALBUMIN 4.3 12/22/2014 1135   AST 20 12/22/2014 1135   ALT 16 12/22/2014 1135   ALKPHOS 65 12/22/2014 1135   BILITOT 1.7* 12/22/2014 1135   GFRNONAA >60 12/22/2014 1135   GFRAA >60 12/22/2014 1135     ASSESSMENT and THERAPY PLAN:  Multicentric stage I left breast cancer, triple negative. left total mastectomy with sentinel node sampling on 10/11/ 2011 at which time 0.4 cm and 0.2 cm tumors were found, both triple negative. She received no postoperative therapy. Hyperbilirubinemia  She is doing well overall. She is active and without evidence of recurrence. We will schedule her for her next mammogram in May and plan on seeing her back after her mammogram with a breast exam.  She has mild hyperbilirubinemia, I am uncertain of the cause. We will recheck her bilirubin again in a proximally 6 weeks and if it stays elevated we will consider referring this back to her primary physician or proceeding with ultrasound.  All questions were answered. The patient knows to call the clinic with any problems, questions or concerns. We can certainly see the patient much sooner if necessary.   This document serves as a record of services personally performed by Ancil Linsey, MD. It was created on her behalf by  Janace Hoard, a trained medical scribe. The creation of this record is based on the scribe's personal observations and the provider's statements to them. This document has been checked and approved by the attending provider.   I have reviewed the above documentation for accuracy and completeness, and I agree with the above.  This note was electronically signed.   Kelby Fam. Whitney Muse, MD   Molli Hazard MD 12/22/2014

## 2014-12-22 NOTE — Patient Instructions (Signed)
..  [place] at [facility] Discharge Instructions  RECOMMENDATIONS MADE BY THE CONSULTANT AND ANY TEST RESULTS WILL BE SENT TO YOUR REFERRING PHYSICIAN.  Mammogram in May 2017   Thank you for choosing [place] at [facility] to provide your oncology and hematology care.  To afford each patient quality time with our provider, please arrive at least 15 minutes before your scheduled appointment time.    You need to re-schedule your appointment should you arrive 10 or more minutes late.  We strive to give you quality time with our providers, and arriving late affects you and other patients whose appointments are after yours.  Also, if you no show three or more times for appointments you may be dismissed from the clinic at the providers discretion.     Again, thank you for choosing [place].  Our hope is that these requests will decrease the amount of time that you wait before being seen by our physicians.       _____________________________________________________________  Should you have questions after your visit to [facility], please contact our office at [phone] between the hours of 8:30 a.m. and 4:30 p.m.  Voicemails left after 4:30 p.m. will not be returned until the following business day.  For prescription refill requests, have your pharmacy contact our office.

## 2015-01-07 ENCOUNTER — Other Ambulatory Visit: Payer: Self-pay | Admitting: Cardiology

## 2015-01-07 NOTE — Telephone Encounter (Signed)
Rx(s) sent to pharmacy electronically. OV 02/04/15

## 2015-01-18 DIAGNOSIS — Z1389 Encounter for screening for other disorder: Secondary | ICD-10-CM | POA: Diagnosis not present

## 2015-01-18 DIAGNOSIS — T07 Unspecified multiple injuries: Secondary | ICD-10-CM | POA: Diagnosis not present

## 2015-01-18 DIAGNOSIS — E6609 Other obesity due to excess calories: Secondary | ICD-10-CM | POA: Diagnosis not present

## 2015-01-18 DIAGNOSIS — Z6831 Body mass index (BMI) 31.0-31.9, adult: Secondary | ICD-10-CM | POA: Diagnosis not present

## 2015-02-02 ENCOUNTER — Encounter (HOSPITAL_COMMUNITY): Payer: Medicare Other | Attending: Hematology & Oncology

## 2015-02-02 DIAGNOSIS — Z853 Personal history of malignant neoplasm of breast: Secondary | ICD-10-CM | POA: Diagnosis present

## 2015-02-02 DIAGNOSIS — C50912 Malignant neoplasm of unspecified site of left female breast: Secondary | ICD-10-CM | POA: Diagnosis not present

## 2015-02-02 LAB — COMPREHENSIVE METABOLIC PANEL
ALT: 20 U/L (ref 14–54)
AST: 21 U/L (ref 15–41)
Albumin: 4.2 g/dL (ref 3.5–5.0)
Alkaline Phosphatase: 63 U/L (ref 38–126)
Anion gap: 8 (ref 5–15)
BUN: 22 mg/dL — AB (ref 6–20)
CHLORIDE: 97 mmol/L — AB (ref 101–111)
CO2: 32 mmol/L (ref 22–32)
Calcium: 9.2 mg/dL (ref 8.9–10.3)
Creatinine, Ser: 0.77 mg/dL (ref 0.44–1.00)
Glucose, Bld: 95 mg/dL (ref 65–99)
POTASSIUM: 4.3 mmol/L (ref 3.5–5.1)
Sodium: 137 mmol/L (ref 135–145)
Total Bilirubin: 1.9 mg/dL — ABNORMAL HIGH (ref 0.3–1.2)
Total Protein: 8.2 g/dL — ABNORMAL HIGH (ref 6.5–8.1)

## 2015-02-02 NOTE — Progress Notes (Signed)
Labs drawn

## 2015-02-04 ENCOUNTER — Encounter: Payer: Self-pay | Admitting: Cardiology

## 2015-02-04 ENCOUNTER — Ambulatory Visit (INDEPENDENT_AMBULATORY_CARE_PROVIDER_SITE_OTHER): Payer: Medicare Other | Admitting: Cardiology

## 2015-02-04 VITALS — BP 160/80 | HR 64 | Ht 60.0 in | Wt 166.0 lb

## 2015-02-04 DIAGNOSIS — E669 Obesity, unspecified: Secondary | ICD-10-CM

## 2015-02-04 DIAGNOSIS — I2510 Atherosclerotic heart disease of native coronary artery without angina pectoris: Secondary | ICD-10-CM | POA: Diagnosis not present

## 2015-02-04 DIAGNOSIS — E785 Hyperlipidemia, unspecified: Secondary | ICD-10-CM | POA: Diagnosis not present

## 2015-02-04 DIAGNOSIS — I119 Hypertensive heart disease without heart failure: Secondary | ICD-10-CM

## 2015-02-04 DIAGNOSIS — I10 Essential (primary) hypertension: Secondary | ICD-10-CM

## 2015-02-04 MED ORDER — AMLODIPINE BESYLATE 5 MG PO TABS: 5.0000 mg | ORAL_TABLET | Freq: Every day | ORAL | Status: DC

## 2015-02-04 NOTE — Progress Notes (Signed)
PATIENT: Angelica [name] MRN: [id number] DOB: 10-12-1932 PCP: [name]., MD  Clinic Note: Chief Complaint  Patient presents with  . 6 MONTHS    Patient feels SOB when she walks. She says that her left hand and arm swells.  . Hypertension   HPI: Angelica [name] is a 81 y.o. female with a PMH below who presents today for six-month followup and to establish new cardiology care at the retirement of Dr. Rollene Fare.  She mostly comes for evaluation along with her husband. She had heart catheterization in 2006 showed really minimal CAD followed up by a Myoview 2013 that was negative for ischemia along with an echocardiogram in 2013 & last July that were essentially normal. She apparently had a cardiac arrest episode during partial thyroidectomy in the past, but has not had any further cardiac issues since.   I last saw her in Jan 2015 & she saw Kerin Ransom, [place] August following an overnight r/o MI admission for CP. -- > in-house echo results reviewed & updated in [facility].  Interval History: She comes in today doing relatively well.  She denies any significant chest pressure over last several months. She's been reactive on now, most recently having gone on to the mountains on vacation with her family. She actually went fishing and caught a fish with a fishing net.  She still does get somewhat short of breath when she is walking back and forth on her driveway, but not routinely. She notes mild occasional palpitations but nothing consistent with a rapid irregular heartbeat/rate to suggest an arrhythmia.  No dyspnea at rest, and the chest discomfort is not exacerbated with exertion.  The remainder of cardiac review of systems is as follows: Cardiovascular ROS: positive for - dyspnea on exertion, edema and Mild palpitations negative for - chest pain, irregular heartbeat, loss of consciousness, murmur, orthopnea, paroxysmal nocturnal dyspnea, rapid heart rate, shortness of breath or Syncope/near syncope,  TIA/amaurosis fugax :  Past Medical History  Diagnosis Date  . HTN (hypertension)   . Hypothyroid   . Hyperlipidemia   . Anemia   . Gout   . Colon polyps     Previous colonoscopy by Dr. Irving Shows  . Breast cancer 2011    left; no adjuvent therapy, Dr. Sonny Dandy  . Arthritis   . Chronic bronchitis   . Diverticulosis of colon   . Hemorrhoids   . MI (myocardial infarction) 1973    By report.  . Coronary artery disease, non-occlusive 2006    Roughly 40% RCA.; Negative Myoview in June 2013    Prior Cardiac Evaluation and Past Surgical History: Past Surgical History  Procedure Laterality Date  . Abdominal hysterectomy      TAH  . Cholecystectomy    . Hemorrhoid surgery    . Colonoscopy       ?3 years ago. Per pt, diverticulosis, hemorrhoids  . Colonoscopy  04/06/2011    Procedure: COLONOSCOPY;  Surgeon: Dorothyann Peng, MD;  Location: [facility];  Service: Endoscopy;  Laterality: N/A;  9:30  . Mastectomy  2011    left  . Cataract extraction w/phaco  07/02/2011    Procedure: CATARACT EXTRACTION PHACO AND INTRAOCULAR LENS PLACEMENT (IOC);  Surgeon: Williams Che, MD;  Location: [facility] ORS;  Service: Ophthalmology;  Laterality: Left;  CDE:3.45  . Cataract extraction w/phaco  04/17/2012    Procedure: CATARACT EXTRACTION PHACO AND INTRAOCULAR LENS PLACEMENT (IOC);  Surgeon: Tonny Branch, MD;  Location: [facility] ORS;  Service: Ophthalmology;  Laterality: Right;  CDE:13.40  . Cardiac catheterization  2006    Roughly 40% RCA lesion, otherwise normal.  . Nm myoview ltd  June 2013    No ischemia or infarction. Normal EF  . Transthoracic echocardiogram  June 2013; 12/2013    a) Normal EF, no significant valve disease.; b) EF 50-55%, Gr1 DD, mild LVH.   . Laparoscopic lysis of adhesions  2008    Dr. Zella Richer with repair of incisional hernia noted -     Allergies  Allergen Reactions  . Bee Venom Anaphylaxis  . Ciprofloxacin Nausea And Vomiting  . Clindamycin Hcl     Does not know  . Flagyl  [Metronidazole Hcl] Nausea And Vomiting  . Shellfish-Derived Products     Rash developed so patient has steered clear of any since last year  . Sulfa Antibiotics Nausea Only    Prior to Admission medications   Medication Sig Start Date End Date Taking? Authorizing Provider  allopurinol (ZYLOPRIM) 300 MG tablet Take 300 mg by mouth daily.     Yes Historical Provider, MD  alum & mag hydroxide-simeth (MAALOX/MYLANTA) 200-200-20 MG/5ML suspension Take 30 mLs by mouth every 6 (six) hours as needed for indigestion or heartburn (dyspepsia). 12/28/13  Yes Radene Gunning, NP  aspirin EC 81 MG tablet Take 81 mg by mouth daily.   Yes Historical Provider, MD  atorvastatin (LIPITOR) 20 MG tablet TAKE (1) TABLET BY MOUTH AT BEDTIME FOR CHOLESTEROL. 07/14/14  Yes Leonie Man, MD  enalapril (VASOTEC) 10 MG tablet Take 10 mg by mouth daily.   Yes Historical Provider, MD  furosemide (LASIX) 20 MG tablet Take 20 mg by mouth daily.    Yes Historical Provider, MD  levothyroxine (SYNTHROID, LEVOTHROID) 50 MCG tablet Take 50 mcg by mouth daily.    Yes Historical Provider, MD  metoprolol succinate (TOPROL-XL) 50 MG 24 hr tablet Take 50 mg by mouth daily. Take with or immediately following a meal.   Yes Historical Provider, MD  Multiple Vitamins-Minerals (MULTIVITAMINS THER. W/MINERALS) TABS Take 1 tablet by mouth daily.     Yes Historical Provider, MD  nitroGLYCERIN (NITROSTAT) 0.4 MG SL tablet Place 1 tablet (0.4 mg total) under the tongue every 5 (five) minutes as needed for chest pain. 01/05/14  Yes Luke K Kilroy, PA-C  pantoprazole (PROTONIX) 40 MG tablet TAKE ONE TABLET BY MOUTH DAILY. 01/07/15  Yes Leonie Man, MD  potassium chloride SA (K-DUR,KLOR-CON) 20 MEQ tablet Take 20 mEq by mouth daily.    Yes Historical Provider, MD  Propylene Glycol (SYSTANE BALANCE) 0.6 % SOLN Place 1 drop into both eyes 2 (two) times daily.   Yes Historical Provider, MD  amLODipine (NORVASC) 5 MG tablet Take 1 tablet (5 mg total) by  mouth daily. 02/04/15   Leonie Man, MD     Social History   Social History Narrative   She is a very pleasant married African American woman with 2 children and 3 grandchildren. She does with her husband who is retired Social research officer, government. They now live on a large farm in the [place] area with a roughly 1/4 mile driveway. She usually walks a mile about twice a day, one by herself and 1 with her husband. She also enjoys doing gardening.    family history includes Breast cancer in her sister; Prostate cancer in her father. There is no history of Colon cancer, Anesthesia problems, Hypotension, Malignant hyperthermia, or Pseudochol deficiency.  ROS: A comprehensive Review of Systems - Negative except  Symptoms noted in history of present illness as well as right ankle pain and swelling that may be well related to gout. edema is usually controlled with Lasix. Review of Systems  Constitutional: Negative for malaise/fatigue.  HENT: Negative for nosebleeds.   Respiratory: Negative for cough and shortness of breath.   Cardiovascular: Negative for claudication.       Both hands and feet well. Hands feel cold, not as bad as in the winter.  Gastrointestinal: Negative for blood in stool and melena.  Genitourinary: Negative for hematuria.  Musculoskeletal: Negative for joint pain.  Endo/Heme/Allergies: Does not bruise/bleed easily.  All other systems reviewed and are negative.   Wt Readings from Last 3 Encounters:  02/04/15 166 lb (75.297 kg)  12/22/14 170 lb 6.4 oz (77.293 kg)  06/22/14 168 lb (76.204 kg)    PHYSICAL EXAM BP 160/80 mmHg  Pulse 64  Ht 5' (1.524 m)  Wt 166 lb (75.297 kg)  BMI 32.42 kg/m2 General appearance: alert, cooperative, appears stated age, mild distress and mildly obese Neck: no adenopathy, no carotid bruit, no JVD and supple, symmetrical, trachea midline Lungs: clear to auscultation bilaterally, normal percussion bilaterally and Nonlabored, good air movement Heart:  normal apical impulse, regular rate and rhythm, S1, S2 normal and Soft 1/6 SEM at RUSB radiating to carotids; very diffuse tenderness along the left costochondral margin post parasternal and inframammary. Reproducible musculoskeletal pain Abdomen: soft, non-tender; bowel sounds normal; no masses,  no organomegaly Extremities: extremities normal, atraumatic, no cyanosis or edema and Mild increased swelling the right ankle but not warm to touch. Pulses: 2+ and symmetric - difficult to palpate radial pulses bilaterally - abnormal location; PT DP pulses normal bilaterally. Neurologic: Grossly normal  FWY:OVZCHYIFO today: Yes Rate: 64, Rhythm: NSR with PACs. ~ LAA, NSST-T wave abnormalities (likely related to artifact)   Recent Labs:    Chemistry      Component Value Date/Time   NA 137 02/02/2015 1215   K 4.3 02/02/2015 1215   CL 97* 02/02/2015 1215   CO2 32 02/02/2015 1215   BUN 22* 02/02/2015 1215   CREATININE 0.77 02/02/2015 1215   CREATININE 0.76 03/15/2011 1413      Component Value Date/Time   CALCIUM 9.2 02/02/2015 1215   ALKPHOS 63 02/02/2015 1215   AST 21 02/02/2015 1215   ALT 20 02/02/2015 1215   BILITOT 1.9* 02/02/2015 1215      ASSESSMENT / PLAN: Overall relatively stable cardiac standpoint. Blood pressure is a bit elevated today.  Problem List Items Addressed This Visit    Coronary artery disease, non-occlusive (Chronic)    Has had a negative cath and negative Myoview. No active symptoms. On excellent regimen of beta blocker and ACE inhibitor, aspirin and statin. Has not had to use when necessary nitroglycerin one time.      Relevant Medications   amLODipine (NORVASC) 5 MG tablet   Other Relevant Orders   EKG 12-Lead   Essential hypertension (Chronic)    Controlled today. Since she is having the sensation of cold hands, she may have some microvascular spasm peripherally. I will increase Norvasc to 2.5 mg daily. Otherwise continue current dose of enalapril and  Toprol.      Relevant Medications   amLODipine (NORVASC) 5 MG tablet   Hyperlipidemia - Primary (Chronic)    On statin - labs monitored by PCP.      Relevant Medications   amLODipine (NORVASC) 5 MG tablet   Other Relevant Orders   EKG 12-Lead  Hypertensive cardiovascular disease- mild LVH, grade 1 diastolic dysfunction (Chronic)    To avoid diastolic heart failure symptoms, will increase active reduction by increasing amlodipine dose. On beta blocker and ACE inhibitor along with low-dose Lasix for mild edema.      Relevant Medications   amLODipine (NORVASC) 5 MG tablet   Other Relevant Orders   EKG 12-Lead   Obesity (BMI 30-39.9) (Chronic)    She remains active with trying to exercise. Need to monitor dietary intake.      Relevant Orders   EKG 12-Lead       Orders Placed This Encounter  Procedures  . EKG 12-Lead   Meds ordered this encounter  Medications  . amLODipine (NORVASC) 5 MG tablet    Sig: Take 1 tablet (5 mg total) by mouth daily.    Dispense:  90 tablet    Refill:  3    Start amlodipine 5 mg daily  No other changes with medications  Your physician wants you to follow-up in 12 months WITH  Dr Ellyn Hack.    Leonie Man, M.D., M.S. Interventional Cardiologist   Pager # [phone]

## 2015-02-04 NOTE — Patient Instructions (Signed)
Start amlodipine 5 mg daily  No other changes with medications  Your physician wants you to follow-up in 12 months WITH  Dr Ellyn Hack. You will receive a reminder letter in the mail two months in advance. If you don't receive a letter, please call our office to schedule the follow-up appointment.

## 2015-02-06 ENCOUNTER — Encounter: Payer: Self-pay | Admitting: Cardiology

## 2015-02-06 DIAGNOSIS — I10 Essential (primary) hypertension: Secondary | ICD-10-CM | POA: Insufficient documentation

## 2015-02-06 NOTE — Assessment & Plan Note (Signed)
Has had a negative cath and negative Myoview. No active symptoms. On excellent regimen of beta blocker and ACE inhibitor, aspirin and statin. Has not had to use when necessary nitroglycerin one time.

## 2015-02-06 NOTE — Assessment & Plan Note (Signed)
To avoid diastolic heart failure symptoms, will increase active reduction by increasing amlodipine dose. On beta blocker and ACE inhibitor along with low-dose Lasix for mild edema.

## 2015-02-06 NOTE — Assessment & Plan Note (Signed)
She remains active with trying to exercise. Need to monitor dietary intake.

## 2015-02-06 NOTE — Assessment & Plan Note (Signed)
Controlled today. Since she is having the sensation of cold hands, she may have some microvascular spasm peripherally. I will increase Norvasc to 2.5 mg daily. Otherwise continue current dose of enalapril and Toprol.

## 2015-02-06 NOTE — Assessment & Plan Note (Signed)
On statin - labs monitored by PCP. 

## 2015-02-10 ENCOUNTER — Other Ambulatory Visit: Payer: Self-pay | Admitting: Cardiology

## 2015-02-10 ENCOUNTER — Other Ambulatory Visit: Payer: Self-pay | Admitting: Cardiovascular Disease

## 2015-02-10 NOTE — Telephone Encounter (Signed)
REFILL 

## 2015-02-17 DIAGNOSIS — C50919 Malignant neoplasm of unspecified site of unspecified female breast: Secondary | ICD-10-CM | POA: Diagnosis not present

## 2015-02-17 DIAGNOSIS — I10 Essential (primary) hypertension: Secondary | ICD-10-CM | POA: Diagnosis not present

## 2015-02-17 DIAGNOSIS — E063 Autoimmune thyroiditis: Secondary | ICD-10-CM | POA: Diagnosis not present

## 2015-02-17 DIAGNOSIS — E669 Obesity, unspecified: Secondary | ICD-10-CM | POA: Diagnosis not present

## 2015-02-17 DIAGNOSIS — Z0001 Encounter for general adult medical examination with abnormal findings: Secondary | ICD-10-CM | POA: Diagnosis not present

## 2015-02-17 DIAGNOSIS — E782 Mixed hyperlipidemia: Secondary | ICD-10-CM | POA: Diagnosis not present

## 2015-02-17 DIAGNOSIS — Z6832 Body mass index (BMI) 32.0-32.9, adult: Secondary | ICD-10-CM | POA: Diagnosis not present

## 2015-02-17 DIAGNOSIS — M1991 Primary osteoarthritis, unspecified site: Secondary | ICD-10-CM | POA: Diagnosis not present

## 2015-02-18 DIAGNOSIS — Z0001 Encounter for general adult medical examination with abnormal findings: Secondary | ICD-10-CM | POA: Diagnosis not present

## 2015-02-18 DIAGNOSIS — E6609 Other obesity due to excess calories: Secondary | ICD-10-CM | POA: Diagnosis not present

## 2015-02-18 DIAGNOSIS — Z6832 Body mass index (BMI) 32.0-32.9, adult: Secondary | ICD-10-CM | POA: Diagnosis not present

## 2015-02-18 DIAGNOSIS — Z1389 Encounter for screening for other disorder: Secondary | ICD-10-CM | POA: Diagnosis not present

## 2015-02-18 DIAGNOSIS — Z79899 Other long term (current) drug therapy: Secondary | ICD-10-CM | POA: Diagnosis not present

## 2015-02-21 DIAGNOSIS — E063 Autoimmune thyroiditis: Secondary | ICD-10-CM | POA: Diagnosis not present

## 2015-02-21 DIAGNOSIS — Z1389 Encounter for screening for other disorder: Secondary | ICD-10-CM | POA: Diagnosis not present

## 2015-02-21 DIAGNOSIS — Z6832 Body mass index (BMI) 32.0-32.9, adult: Secondary | ICD-10-CM | POA: Diagnosis not present

## 2015-02-21 DIAGNOSIS — Z0001 Encounter for general adult medical examination with abnormal findings: Secondary | ICD-10-CM | POA: Diagnosis not present

## 2015-02-21 DIAGNOSIS — E6609 Other obesity due to excess calories: Secondary | ICD-10-CM | POA: Diagnosis not present

## 2015-02-25 ENCOUNTER — Other Ambulatory Visit (HOSPITAL_COMMUNITY): Payer: Self-pay | Admitting: Hematology & Oncology

## 2015-02-25 DIAGNOSIS — C50919 Malignant neoplasm of unspecified site of unspecified female breast: Secondary | ICD-10-CM

## 2015-03-02 ENCOUNTER — Ambulatory Visit (HOSPITAL_COMMUNITY): Payer: Medicare Other

## 2015-03-04 ENCOUNTER — Ambulatory Visit (HOSPITAL_COMMUNITY)
Admission: RE | Admit: 2015-03-04 | Discharge: 2015-03-04 | Disposition: A | Discharge status: Home / Self Care | Payer: Medicare Other | Source: Ambulatory Visit | Attending: Hematology & Oncology | Admitting: Hematology & Oncology

## 2015-03-04 DIAGNOSIS — K760 Fatty (change of) liver, not elsewhere classified: Secondary | ICD-10-CM | POA: Diagnosis not present

## 2015-03-04 DIAGNOSIS — C50919 Malignant neoplasm of unspecified site of unspecified female breast: Secondary | ICD-10-CM

## 2015-03-16 DIAGNOSIS — Z23 Encounter for immunization: Secondary | ICD-10-CM | POA: Diagnosis not present

## 2015-03-17 ENCOUNTER — Encounter: Payer: Self-pay | Admitting: Gastroenterology

## 2015-04-05 ENCOUNTER — Encounter: Payer: Self-pay | Admitting: Gastroenterology

## 2015-04-05 ENCOUNTER — Ambulatory Visit (INDEPENDENT_AMBULATORY_CARE_PROVIDER_SITE_OTHER): Payer: Medicare Other | Admitting: Gastroenterology

## 2015-04-05 VITALS — BP 136/81 | HR 66 | Temp 97.3°F | Ht 60.0 in | Wt 168.2 lb

## 2015-04-05 DIAGNOSIS — I2510 Atherosclerotic heart disease of native coronary artery without angina pectoris: Secondary | ICD-10-CM | POA: Diagnosis not present

## 2015-04-05 DIAGNOSIS — R7989 Other specified abnormal findings of blood chemistry: Secondary | ICD-10-CM | POA: Diagnosis not present

## 2015-04-05 DIAGNOSIS — Z86010 Personal history of colonic polyps: Secondary | ICD-10-CM

## 2015-04-05 DIAGNOSIS — R945 Abnormal results of liver function studies: Secondary | ICD-10-CM

## 2015-04-05 NOTE — Patient Instructions (Signed)
1. Please have your labs done. We will contact you with results within 5-7 business days.

## 2015-04-05 NOTE — Progress Notes (Addendum)
Primary Care Physician: [name]., MD  Primary Gastroenterologist:  Barney Drain, MD   Chief Complaint  Patient presents with  . Abnormal Lab    elevated liver enzymes    HPI: Angelica [name] is a 81 y.o. female here for further evaluation of abnormal LFTs at the request of Dr. Ancil Linsey. Patient has a history of stage I left breast cancer diagnosed in 2011 status post left total mastectomy.we last saw the patient back in 2012 prior to her last colonoscopy, single tubular adenoma removed from ICV.  Patient's weight has been stable. Bowel movements regular for several years. Usually once daily. No melena or rectal bleeding. No heartburn. Denies jaundice or itching. Received a blood transfusion over 50 years ago. Denies blood donation, illicit drug use, tattoos. Denies abdominal pain.   Abdominal ultrasound done on 03/04/2015 unremarkable.CT abdomen October 2012 with normal appearing liver at that time however prior CT in 2011 did suggest fatty liver. Patient noted to have slightly elevated total bilirubin as far back as July 2015. Total bilirubin was 1.4 however direct bilirubin was only 0.2.AST, ALT, alkaline phosphatase normal.  Current Outpatient Prescriptions  Medication Sig Dispense Refill  . allopurinol (ZYLOPRIM) 300 MG tablet Take 300 mg by mouth daily.      Marland Kitchen alum & mag hydroxide-simeth (MAALOX/MYLANTA) 200-200-20 MG/5ML suspension Take 30 mLs by mouth every 6 (six) hours as needed for indigestion or heartburn (dyspepsia). 355 mL 0  . amLODipine (NORVASC) 5 MG tablet Take 1 tablet (5 mg total) by mouth daily. 90 tablet 3  . aspirin EC 81 MG tablet Take 81 mg by mouth daily.    Marland Kitchen atorvastatin (LIPITOR) 20 MG tablet TAKE (1) TABLET BY MOUTH AT BEDTIME FOR CHOLESTEROL. 30 tablet 11  . enalapril (VASOTEC) 10 MG tablet Take 10 mg by mouth daily.    . furosemide (LASIX) 20 MG tablet Take 20 mg by mouth daily.     Marland Kitchen levothyroxine (SYNTHROID, LEVOTHROID) 50 MCG tablet  Take 50 mcg by mouth daily.     . metoprolol succinate (TOPROL-XL) 50 MG 24 hr tablet Take 50 mg by mouth daily. Take with or immediately following a meal.    . Multiple Vitamins-Minerals (MULTIVITAMINS THER. W/MINERALS) TABS Take 1 tablet by mouth daily.      . nitroGLYCERIN (NITROSTAT) 0.4 MG SL tablet Place 1 tablet (0.4 mg total) under the tongue every 5 (five) minutes as needed for chest pain. 90 tablet 3  . pantoprazole (PROTONIX) 40 MG tablet TAKE ONE TABLET BY MOUTH DAILY. 30 tablet 11  . potassium chloride SA (K-DUR,KLOR-CON) 20 MEQ tablet Take 20 mEq by mouth daily.     Marland Kitchen Propylene Glycol (SYSTANE BALANCE) 0.6 % SOLN Place 1 drop into both eyes 2 (two) times daily.     No current facility-administered medications for this visit.    Allergies as of 04/05/2015 - Review Complete 04/05/2015  Allergen Reaction Noted  . Bee venom Anaphylaxis 10/22/2011  . Ciprofloxacin Nausea And Vomiting 03/14/2011  . Clindamycin hcl  03/14/2011  . Flagyl [metronidazole hcl] Nausea And Vomiting 03/14/2011  . Shellfish-derived products  03/26/2011  . Sulfa antibiotics Nausea Only 03/14/2011   Past Medical History  Diagnosis Date  . HTN (hypertension)   . Hypothyroid   . Hyperlipidemia   . Anemia   . Gout   . Colon polyps     Previous colonoscopy by Dr. Irving Shows  . Breast cancer ([place]) 2011    left; no adjuvent  therapy, Dr. Sonny Dandy  . Arthritis   . Chronic bronchitis   . Diverticulosis of colon   . Hemorrhoids   . MI (myocardial infarction) ([place]) 1973    By report.  . Coronary artery disease, non-occlusive 2006    Roughly 40% RCA.; Negative Myoview in June 2013   Past Surgical History  Procedure Laterality Date  . Abdominal hysterectomy      TAH  . Cholecystectomy    . Hemorrhoid surgery    . Colonoscopy       ?3 years ago. Per pt, diverticulosis, hemorrhoids  . Colonoscopy  04/06/2011    SLF: 1. Sessile polyp at the ileocecal valve 2. Diverticulosis, moderate 3. Internal  hemorrhoids, Large-causing rectal bleeding associated with straining. tubular adenoma.  . Mastectomy  2011    left  . Cataract extraction w/phaco  07/02/2011    Procedure: CATARACT EXTRACTION PHACO AND INTRAOCULAR LENS PLACEMENT (IOC);  Surgeon: Williams Che, MD;  Location: [facility];  Service: Ophthalmology;  Laterality: Left;  CDE:3.45  . Cataract extraction w/phaco  04/17/2012    Procedure: CATARACT EXTRACTION PHACO AND INTRAOCULAR LENS PLACEMENT (IOC);  Surgeon: Tonny Branch, MD;  Location: [facility];  Service: Ophthalmology;  Laterality: Right;  CDE:13.40  . Cardiac catheterization  2006    Roughly 40% RCA lesion, otherwise normal.  . Nm myoview ltd  June 2013    No ischemia or infarction. Normal EF  . Transthoracic echocardiogram  June 2013; 12/2013    a) Normal EF, no significant valve disease.; b) EF 50-55%, Gr1 DD, mild LVH.   . Laparoscopic lysis of adhesions  2008    Dr. Zella Richer with repair of incisional hernia noted -    Family History  Problem Relation Age of Onset  . Prostate cancer Father   . Breast cancer Sister   . Colon cancer Neg Hx   . Anesthesia problems Neg Hx   . Hypotension Neg Hx   . Malignant hyperthermia Neg Hx   . Pseudochol deficiency Neg Hx   . Liver disease Neg Hx   . Breast cancer Other     paternal niece   Social History   Social History  . Marital Status: Married    Spouse Name: N/A  . Number of Children: 2  . Years of Education: N/A   Occupational History  . retired    Social History Main Topics  . Smoking status: Never Smoker   . Smokeless tobacco: Never Used  . Alcohol Use: No  . Drug Use: No  . Sexual Activity: Not Currently   Other Topics Concern  . None   Social History Narrative   She is a very pleasant married African American woman with 2 children and 3 grandchildren. She does with her husband who is retired Social research officer, government. They now live on a large farm in the [place] area with a roughly 1/4 mile driveway. She usually walks a  mile about twice a day, one by herself and 1 with her husband. She also enjoys doing gardening.    ROS:  General: Negative for anorexia, weight loss, fever, chills, fatigue, weakness. ENT: Negative for hoarseness, difficulty swallowing , nasal congestion. CV: Negative for chest pain, angina, palpitations, dyspnea on exertion, peripheral edema.  Respiratory: Negative for dyspnea at rest, dyspnea on exertion, cough, sputum, wheezing.  GI: See history of present illness. GU:  Negative for dysuria, hematuria, urinary incontinence, urinary frequency, nocturnal urination.  Endo: Negative for unusual weight change.    Physical Examination:  BP 136/81 mmHg  Pulse 66  Temp(Src) 97.3 F (36.3 C) (Oral)  Ht 5' (1.524 m)  Wt 168 lb 3.2 oz (76.295 kg)  BMI 32.85 kg/m2  General: Well-nourished, well-developed in no acute distress.  Eyes: No icterus. Mouth: Oropharyngeal mucosa moist and pink , no lesions erythema or exudate. Lungs: Clear to auscultation bilaterally.  Heart: Regular rate and rhythm, no murmurs rubs or gallops.  Abdomen: Bowel sounds are normal, nontender, nondistended, no hepatosplenomegaly or masses, no abdominal bruits or hernia , no rebound or guarding.   Extremities: No lower extremity edema. No clubbing or deformities. Neuro: Alert and oriented x 4   Skin: Warm and dry, no jaundice.   Psych: Alert and cooperative, normal mood and affect.  Labs:  Lab Results  Component Value Date   WBC 7.1 12/22/2014   HGB 13.1 12/22/2014   HCT 40.5 12/22/2014   MCV 96.0 12/22/2014   PLT 181 12/22/2014   Lab Results  Component Value Date   CREATININE 0.77 02/02/2015   BUN 22* 02/02/2015   NA 137 02/02/2015   K 4.3 02/02/2015   CL 97* 02/02/2015   CO2 32 02/02/2015   Lab Results  Component Value Date   ALT 20 02/02/2015   AST 21 02/02/2015   ALKPHOS 63 02/02/2015   BILITOT 1.9* 02/02/2015   07/2014: tbili 1.7 and indirect bili 1.5  Imaging Studies: No results  found.  CLINICAL DATA: Fatty liver.  EXAM: US ABDOMEN LIMITED - RIGHT UPPER QUADRANT  COMPARISON: CT scan 03/19/2011  FINDINGS: Gallbladder:  Surgically absent  Common bile duct:  Diameter: 7.4 mm  Liver:  Normal echogenicity without focal lesion or biliary dilatation.  IMPRESSION: Status postcholecystectomy. Normal caliber common bile duct.  Unremarkable sonographic appearance of the liver.   Electronically Signed  By: Marijo Sanes M.D.  On: 03/04/2015 10:04

## 2015-04-06 DIAGNOSIS — R7989 Other specified abnormal findings of blood chemistry: Secondary | ICD-10-CM | POA: Diagnosis not present

## 2015-04-07 LAB — HEPATIC FUNCTION PANEL
ALBUMIN: 4.4 g/dL (ref 3.5–4.7)
ALK PHOS: 68 IU/L (ref 39–117)
ALT: 14 IU/L (ref 0–32)
AST: 14 IU/L (ref 0–40)
BILIRUBIN, DIRECT: 0.28 mg/dL (ref 0.00–0.40)
Bilirubin Total: 1.4 mg/dL — ABNORMAL HIGH (ref 0.0–1.2)
Total Protein: 7.4 g/dL (ref 6.0–8.5)

## 2015-04-07 LAB — IRON AND TIBC
IRON: 63 ug/dL (ref 27–139)
Iron Saturation: 25 % (ref 15–55)
Total Iron Binding Capacity: 257 ug/dL (ref 250–450)
UIBC: 194 ug/dL (ref 118–369)

## 2015-04-07 LAB — FERRITIN: FERRITIN: 107 ng/mL (ref 15–150)

## 2015-04-07 LAB — HEPATITIS B SURFACE ANTIGEN: Hepatitis B Surface Ag: NEGATIVE

## 2015-04-07 LAB — HEPATITIS C ANTIBODY

## 2015-04-08 NOTE — Assessment & Plan Note (Signed)
Discussed with patient today. May not require further endoscopies given her age. Will address further with Dr. Oneida Alar.

## 2015-04-08 NOTE — Assessment & Plan Note (Signed)
81 year old female with history of mildly elevated total bilirubin with mostly indirect bilirubin component suggestive of Gilbert's disease. Cannot exclude underlying fatty liver although last two imaging modalities including ultrasound and CT showed normal-appearing liver. Given history of blood transfusion, we will exclude viral hepatitis although I feel this is unlikely. provided reassurance to the patient today. Further recommendations to follow.

## 2015-04-11 NOTE — Progress Notes (Signed)
Cc'ed to pcp °

## 2015-04-20 ENCOUNTER — Telehealth: Payer: Self-pay | Admitting: Gastroenterology

## 2015-04-20 NOTE — Telephone Encounter (Signed)
Pt called to see if her lab results were back. She said it's been 2 weeks and she hasn't heard from [place]. Please call [phone]

## 2015-04-20 NOTE — Telephone Encounter (Signed)
Routing to Leslie Lewis, PA.  

## 2015-04-20 NOTE — Progress Notes (Signed)
Quick Note:  Please let patient know her labs looked good. Her Total bilirubin is slightly elevated but mostly indirect bili, therefore likely Gilbert's disease. She was negative for Hep B and C, no evidence of too much iron. Previous ct several years ago with fatty liver but recent u/s looked good.   No further work up needed for the liver. LFTs six months. ______

## 2015-04-20 NOTE — Progress Notes (Signed)
Quick Note:  LMOM to call. ______ 

## 2015-04-20 NOTE — Telephone Encounter (Signed)
Please see result note 

## 2015-05-19 DIAGNOSIS — E042 Nontoxic multinodular goiter: Secondary | ICD-10-CM | POA: Diagnosis not present

## 2015-05-19 DIAGNOSIS — E039 Hypothyroidism, unspecified: Secondary | ICD-10-CM | POA: Diagnosis not present

## 2015-05-23 DIAGNOSIS — H26491 Other secondary cataract, right eye: Secondary | ICD-10-CM | POA: Diagnosis not present

## 2015-05-23 DIAGNOSIS — H524 Presbyopia: Secondary | ICD-10-CM | POA: Diagnosis not present

## 2015-05-23 DIAGNOSIS — Z961 Presence of intraocular lens: Secondary | ICD-10-CM | POA: Diagnosis not present

## 2015-05-26 DIAGNOSIS — E039 Hypothyroidism, unspecified: Secondary | ICD-10-CM | POA: Diagnosis not present

## 2015-05-26 DIAGNOSIS — E042 Nontoxic multinodular goiter: Secondary | ICD-10-CM | POA: Diagnosis not present

## 2015-05-26 DIAGNOSIS — I10 Essential (primary) hypertension: Secondary | ICD-10-CM | POA: Diagnosis not present

## 2015-05-26 DIAGNOSIS — E785 Hyperlipidemia, unspecified: Secondary | ICD-10-CM | POA: Diagnosis not present

## 2015-05-26 DIAGNOSIS — E063 Autoimmune thyroiditis: Secondary | ICD-10-CM | POA: Diagnosis not present

## 2015-07-04 DIAGNOSIS — H26493 Other secondary cataract, bilateral: Secondary | ICD-10-CM | POA: Diagnosis not present

## 2015-07-04 DIAGNOSIS — T8529XA Other mechanical complication of intraocular lens, initial encounter: Secondary | ICD-10-CM | POA: Diagnosis not present

## 2015-10-06 DIAGNOSIS — H903 Sensorineural hearing loss, bilateral: Secondary | ICD-10-CM | POA: Diagnosis not present

## 2015-10-27 DIAGNOSIS — Z1231 Encounter for screening mammogram for malignant neoplasm of breast: Secondary | ICD-10-CM | POA: Diagnosis not present

## 2015-11-02 ENCOUNTER — Encounter (HOSPITAL_COMMUNITY): Payer: Self-pay | Admitting: Oncology

## 2015-11-02 ENCOUNTER — Encounter (HOSPITAL_COMMUNITY): Payer: Medicare Other | Attending: Oncology | Admitting: Oncology

## 2015-11-02 ENCOUNTER — Ambulatory Visit (HOSPITAL_COMMUNITY): Payer: Self-pay | Admitting: Oncology

## 2015-11-02 ENCOUNTER — Encounter (HOSPITAL_COMMUNITY): Payer: Medicare Other

## 2015-11-02 VITALS — BP 172/88 | HR 69 | Temp 98.0°F | Resp 20 | Wt 164.8 lb

## 2015-11-02 DIAGNOSIS — Z86010 Personal history of colonic polyps: Secondary | ICD-10-CM | POA: Insufficient documentation

## 2015-11-02 DIAGNOSIS — Z9049 Acquired absence of other specified parts of digestive tract: Secondary | ICD-10-CM | POA: Insufficient documentation

## 2015-11-02 DIAGNOSIS — Z90710 Acquired absence of both cervix and uterus: Secondary | ICD-10-CM | POA: Diagnosis not present

## 2015-11-02 DIAGNOSIS — Z808 Family history of malignant neoplasm of other organs or systems: Secondary | ICD-10-CM | POA: Diagnosis not present

## 2015-11-02 DIAGNOSIS — Z08 Encounter for follow-up examination after completed treatment for malignant neoplasm: Secondary | ICD-10-CM | POA: Diagnosis not present

## 2015-11-02 DIAGNOSIS — M109 Gout, unspecified: Secondary | ICD-10-CM | POA: Insufficient documentation

## 2015-11-02 DIAGNOSIS — E785 Hyperlipidemia, unspecified: Secondary | ICD-10-CM | POA: Diagnosis not present

## 2015-11-02 DIAGNOSIS — I2510 Atherosclerotic heart disease of native coronary artery without angina pectoris: Secondary | ICD-10-CM | POA: Insufficient documentation

## 2015-11-02 DIAGNOSIS — C50912 Malignant neoplasm of unspecified site of left female breast: Secondary | ICD-10-CM | POA: Diagnosis not present

## 2015-11-02 DIAGNOSIS — I252 Old myocardial infarction: Secondary | ICD-10-CM | POA: Diagnosis not present

## 2015-11-02 DIAGNOSIS — E039 Hypothyroidism, unspecified: Secondary | ICD-10-CM | POA: Diagnosis not present

## 2015-11-02 DIAGNOSIS — Z9012 Acquired absence of left breast and nipple: Secondary | ICD-10-CM | POA: Diagnosis not present

## 2015-11-02 DIAGNOSIS — I10 Essential (primary) hypertension: Secondary | ICD-10-CM | POA: Insufficient documentation

## 2015-11-02 DIAGNOSIS — Z853 Personal history of malignant neoplasm of breast: Secondary | ICD-10-CM | POA: Insufficient documentation

## 2015-11-02 DIAGNOSIS — Z9889 Other specified postprocedural states: Secondary | ICD-10-CM | POA: Insufficient documentation

## 2015-11-02 DIAGNOSIS — M1990 Unspecified osteoarthritis, unspecified site: Secondary | ICD-10-CM | POA: Diagnosis not present

## 2015-11-02 DIAGNOSIS — Z171 Estrogen receptor negative status [ER-]: Secondary | ICD-10-CM | POA: Diagnosis not present

## 2015-11-02 LAB — COMPREHENSIVE METABOLIC PANEL
ALBUMIN: 4.3 g/dL (ref 3.5–5.0)
ALT: 18 U/L (ref 14–54)
ANION GAP: 8 (ref 5–15)
AST: 20 U/L (ref 15–41)
Alkaline Phosphatase: 71 U/L (ref 38–126)
BUN: 13 mg/dL (ref 6–20)
CHLORIDE: 99 mmol/L — AB (ref 101–111)
CO2: 30 mmol/L (ref 22–32)
Calcium: 8.9 mg/dL (ref 8.9–10.3)
Creatinine, Ser: 0.67 mg/dL (ref 0.44–1.00)
GFR calc Af Amer: >60 mL/min (ref >60)
GFR calc non Af Amer: >60 mL/min (ref >60)
GLUCOSE: 103 mg/dL — AB (ref 65–99)
Potassium: 3.7 mmol/L (ref 3.5–5.1)
SODIUM: 137 mmol/L (ref 135–145)
Total Bilirubin: 1.2 mg/dL (ref 0.3–1.2)
Total Protein: 8.2 g/dL — ABNORMAL HIGH (ref 6.5–8.1)

## 2015-11-02 LAB — CBC WITH DIFFERENTIAL/PLATELET
BASOS PCT: 1 %
Basophils Absolute: 0.1 10*3/uL (ref 0.0–0.1)
EOS ABS: 0.2 10*3/uL (ref 0.0–0.7)
EOS PCT: 2 %
HCT: 42.1 % (ref 36.0–46.0)
HEMOGLOBIN: 13.6 g/dL (ref 12.0–15.0)
LYMPHS ABS: 3.1 10*3/uL (ref 0.7–4.0)
Lymphocytes Relative: 38 %
MCH: 30.8 pg (ref 26.0–34.0)
MCHC: 32.3 g/dL (ref 30.0–36.0)
MCV: 95.2 fL (ref 78.0–100.0)
Monocytes Absolute: 0.4 10*3/uL (ref 0.1–1.0)
Monocytes Relative: 5 %
NEUTROS PCT: 54 %
Neutro Abs: 4.5 10*3/uL (ref 1.7–7.7)
PLATELETS: 189 10*3/uL (ref 150–400)
RBC: 4.42 MIL/uL (ref 3.87–5.11)
RDW: 14.3 % (ref 11.5–15.5)
WBC: 8.2 10*3/uL (ref 4.0–10.5)

## 2015-11-02 NOTE — Progress Notes (Addendum)
[name]., MD [place] [id number]  Malignant neoplasm of left female breast, unspecified site of breast ([place]) - Plan: CBC with Differential, Comprehensive metabolic panel, CBC with Differential, Comprehensive metabolic panel  CURRENT THERAPY: Surveillance per NCCN guidelines  INTERVAL HISTORY: Angelica [name] 82 y.o. female returns for followup of Multicentric stage I left breast cancer, triple negative.  She is S/P left total mastectomy with sentinel node sampling on 10/11/ 2011 at which time 0.4 cm and 0.2 cm tumors were found, both triple negative. She received no postoperative therapy.  I personally reviewed and went over laboratory results with the patient.  The results are noted within this dictation.  Labs will be updated today.  I personally reviewed and went over radiographic studies with the patient.  The results are noted within this dictation.  Mammogram on 10/25/2014 was BIRADS 1.  She notes that she had another mammogram performed this year (just last week) at the [facility] in [place], [place].  We will get a copy of this report today.  She reports that her "heart doctor and thyroid doctor" felt lumps in her neck.  She admits that she cannot palpate these.  She reports that no known follow-up of this is performed.  She denies any changes in her appetite.  Her weight is stable over the past 1 year.  She denies any fevers, chills, nigh sweats.  She denies any symptoms that would be concerning for recurrence of disease.  Review of Systems  Constitutional: Negative for fever, chills, weight loss and malaise/fatigue.  HENT: Negative.  Negative for hearing loss and tinnitus.   Eyes: Negative.  Negative for blurred vision and double vision.  Respiratory: Negative.  Negative for cough, hemoptysis and shortness of breath.   Cardiovascular: Negative.  Negative for chest pain.  Gastrointestinal: Negative.  Negative for nausea, vomiting,  abdominal pain, diarrhea, constipation, blood in stool and melena.  Genitourinary: Negative.  Negative for dysuria, frequency and hematuria.  Musculoskeletal: Negative.  Negative for falls and neck pain.  Skin: Negative.   Neurological: Negative.  Negative for sensory change, speech change, focal weakness, seizures, loss of consciousness, weakness and headaches.  Endo/Heme/Allergies: Negative.   Psychiatric/Behavioral: Negative.     Past Medical History  Diagnosis Date  . HTN (hypertension)   . Hypothyroid   . Hyperlipidemia   . Anemia   . Gout   . Colon polyps     Previous colonoscopy by Dr. Irving Shows  . Breast cancer ([place]) 2011    left; no adjuvent therapy, Dr. Sonny Dandy  . Arthritis   . Chronic bronchitis   . Diverticulosis of colon   . Hemorrhoids   . MI (myocardial infarction) ([place]) 1973    By report.  . Coronary artery disease, non-occlusive 2006    Roughly 40% RCA.; Negative Myoview in June 2013    Past Surgical History  Procedure Laterality Date  . Abdominal hysterectomy      TAH  . Cholecystectomy    . Hemorrhoid surgery    . Colonoscopy       ?3 years ago. Per pt, diverticulosis, hemorrhoids  . Colonoscopy  04/06/2011    SLF: 1. Sessile polyp at the ileocecal valve 2. Diverticulosis, moderate 3. Internal hemorrhoids, Large-causing rectal bleeding associated with straining. tubular adenoma.  . Mastectomy  2011    left  . Cataract extraction w/phaco  07/02/2011    Procedure: CATARACT EXTRACTION PHACO AND INTRAOCULAR LENS PLACEMENT (IOC);  Surgeon: Williams Che, MD;  Location: [facility];  Service: Ophthalmology;  Laterality: Left;  CDE:3.45  . Cataract extraction w/phaco  04/17/2012    Procedure: CATARACT EXTRACTION PHACO AND INTRAOCULAR LENS PLACEMENT (IOC);  Surgeon: Tonny Branch, MD;  Location: [facility];  Service: Ophthalmology;  Laterality: Right;  CDE:13.40  . Cardiac catheterization  2006    Roughly 40% RCA lesion, otherwise normal.  . Nm myoview ltd  June 2013     No ischemia or infarction. Normal EF  . Transthoracic echocardiogram  June 2013; 12/2013    a) Normal EF, no significant valve disease.; b) EF 50-55%, Gr1 DD, mild LVH.   . Laparoscopic lysis of adhesions  2008    Dr. Zella Richer with repair of incisional hernia noted -     Family History  Problem Relation Age of Onset  . Prostate cancer Father   . Breast cancer Sister   . Colon cancer Neg Hx   . Anesthesia problems Neg Hx   . Hypotension Neg Hx   . Malignant hyperthermia Neg Hx   . Pseudochol deficiency Neg Hx   . Liver disease Neg Hx   . Breast cancer Other     paternal niece    Social History   Social History  . Marital Status: Married    Spouse Name: N/A  . Number of Children: 2  . Years of Education: N/A   Occupational History  . retired    Social History Main Topics  . Smoking status: Never Smoker   . Smokeless tobacco: Never Used  . Alcohol Use: No  . Drug Use: No  . Sexual Activity: Not Currently   Other Topics Concern  . None   Social History Narrative   She is a very pleasant married African American woman with 2 children and 3 grandchildren. She does with her husband who is retired Social research officer, government. They now live on a large farm in the [place] area with a roughly 1/4 mile driveway. She usually walks a mile about twice a day, one by herself and 1 with her husband. She also enjoys doing gardening.     PHYSICAL EXAMINATION  ECOG PERFORMANCE STATUS: 0 - Asymptomatic  Filed Vitals:   11/02/15 1004  BP: 172/88  Pulse: 69  Temp: 98 F (36.7 C)  Resp: 20    GENERAL:alert, no distress, well nourished, well developed, comfortable, cooperative, obese, smiling and unaccompanied in examination room. SKIN: skin color, texture, turgor are normal, no rashes or significant lesions HEAD: Normocephalic, No masses, lesions, tenderness or abnormalities EYES: normal, EOMI, Conjunctiva are pink and non-injected EARS: External ears normal OROPHARYNX:no exudate, no  erythema, lips, buccal mucosa, and tongue normal and mucous membranes are moist  NECK: supple, no adenopathy, no stridor, non-tender, trachea midline LYMPH:  no palpable lymphadenopathy, no hepatosplenomegaly BREAST:right breast normal without mass, skin or nipple changes or axillary nodes, left post-mastectomy site well healed and free of suspicious changes LUNGS: clear to auscultation and percussion HEART: regular rate & rhythm, no gallops, S1 normal, S2 normal, physiological split S2 and 1/6 murmur (soft) appreciated best at right sternal border at 2nd ICS ABDOMEN:abdomen soft, non-tender, obese and normal bowel sounds BACK: Back symmetric, no curvature. EXTREMITIES:less then 2 second capillary refill, no skin discoloration, no cyanosis, positive findings:  edema B/L ankle and pedal edema, nonpitting.  NEURO: alert & oriented x 3 with fluent speech, no focal motor/sensory deficits, gait normal   LABORATORY DATA: CBC    Component Value Date/Time   WBC  8.2 11/02/2015 1052   RBC 4.42 11/02/2015 1052   HGB 13.6 11/02/2015 1052   HCT 42.1 11/02/2015 1052   PLT 189 11/02/2015 1052   MCV 95.2 11/02/2015 1052   MCH 30.8 11/02/2015 1052   MCHC 32.3 11/02/2015 1052   RDW 14.3 11/02/2015 1052   LYMPHSABS 3.1 11/02/2015 1052   MONOABS 0.4 11/02/2015 1052   EOSABS 0.2 11/02/2015 1052   BASOSABS 0.1 11/02/2015 1052      Chemistry      Component Value Date/Time   NA 137 11/02/2015 1052   K 3.7 11/02/2015 1052   CL 99* 11/02/2015 1052   CO2 30 11/02/2015 1052   BUN 13 11/02/2015 1052   CREATININE 0.67 11/02/2015 1052   CREATININE 0.76 03/15/2011 1413      Component Value Date/Time   CALCIUM 8.9 11/02/2015 1052   ALKPHOS 71 11/02/2015 1052   AST 20 11/02/2015 1052   ALT 18 11/02/2015 1052   BILITOT 1.2 11/02/2015 1052   BILITOT 1.4* 04/06/2015 1058        PENDING LABS:   RADIOGRAPHIC STUDIES:  No results found.   PATHOLOGY:    ASSESSMENT AND PLAN:  Breast cancer  ([place]) Multicentric stage I left breast cancer, triple negative.  She is S/P left total mastectomy with sentinel node sampling on 10/11/ 2011 at which time 0.4 cm and 0.2 cm tumors were found, both triple negative. She received no postoperative therapy.  Labs today: CBC diff, CMET.  I personally reviewed and went over radiographic studies with the patient.  The results are noted within this dictation.  Mammogram on 10/25/2014 was BIRADS 1. She reports having a mammogram on 10/25/2015 outside of the [facility].  I do not see anything in [place].  She had it at the [facility] in , [place].  We will get a copy of this report.  Labs in 6 months: CBC diff, CMET.  She has been evaluated by GI for elevated liver enzymes and bilirubin.  GI work-up is suggestive of Gilbert's Disease.  Return in 6 months for follow-up.  Her 5 year mark was in October 2016.  Moving forward, it would not be unreasonable to see the patient on an annual basis with regular primary care follow-up and annual mammograms.  ADDENDUM: Mammogram on 10/27/2015 was BIRADS 1.    ORDERS PLACED FOR THIS ENCOUNTER: Orders Placed This Encounter  Procedures  . CBC with Differential  . Comprehensive metabolic panel  . CBC with Differential  . Comprehensive metabolic panel    MEDICATIONS PRESCRIBED THIS ENCOUNTER: No orders of the defined types were placed in this encounter.    THERAPY PLAN:  NCCN guidelines recommends the following surveillance for invasive breast cancer (2.2017):  A. History and Physical exam 1-4 times per year as clinically appropriate for 5 years, then annually.  B. Periodic screening for changes in family history and referral to genetics counseling as indicated  C. Educate, monitor, and refer to lymphedema management.  D. Mammography every 12 months  E. Routine imaging of reconstructed breast is not indicated.  F. In the absence of clinical signs and symptoms suggestive of  recurrent disease, there is no indication for laboratory or imaging studies for metastases screening.  G. Women on Tamoxifen: annual gynecologic assessment every 12 months if uterus is present.  H. Women on aromatase inhibitor or who experience ovarian failure secondary to treatment should have monitoring of bone health with a bone mineral density determination at baseline and periodically thereafter.  I. Assess and encourage adherence to adjuvant endocrine therapy.  J. Evidence suggests that active lifestyle, healthy diet, limited alcohol intake, and achieving and maintaining an ideal body weight (20-25 BMI) may lead to optimal breast cancer outcomes.   All questions were answered. The patient knows to call the clinic with any problems, questions or concerns. We can certainly see the patient much sooner if necessary.  Patient and plan discussed with Dr. Ancil Linsey and she is in agreement with the aforementioned.   This note is electronically signed by: Doy Mince 11/02/2015 1:35 PM

## 2015-11-02 NOTE — Assessment & Plan Note (Addendum)
Multicentric stage I left breast cancer, triple negative.  She is S/P left total mastectomy with sentinel node sampling on 10/11/ 2011 at which time 0.4 cm and 0.2 cm tumors were found, both triple negative. She received no postoperative therapy.  Labs today: CBC diff, CMET.  I personally reviewed and went over radiographic studies with the patient.  The results are noted within this dictation.  Mammogram on 10/25/2014 was BIRADS 1. She reports having a mammogram on 10/25/2015 outside of the [facility].  I do not see anything in [place].  She had it at the [facility] in [place], [place].  We will get a copy of this report.  Labs in 6 months: CBC diff, CMET.  She has been evaluated by GI for elevated liver enzymes and bilirubin.  GI work-up is suggestive of Gilbert's Disease.  Return in 6 months for follow-up.  Her 5 year mark was in October 2016.  Moving forward, it would not be unreasonable to see the patient on an annual basis with regular primary care follow-up and annual mammograms.  ADDENDUM: Mammogram on 10/27/2015 was BIRADS 1.

## 2015-11-02 NOTE — Addendum Note (Signed)
Addended by: Deno Etienne B on: 11/02/2015 01:05 PM   Modules accepted: Medications

## 2015-11-02 NOTE — Patient Instructions (Signed)
[place] at [facility] Discharge Instructions  RECOMMENDATIONS MADE BY THE CONSULTANT AND ANY TEST RESULTS WILL BE SENT TO YOUR REFERRING PHYSICIAN.  Labs today and in 6 months   Return in 6 months to see Dr. Whitney Muse  Thank you for choosing [place] at [facility] to provide your oncology and hematology care.  To afford each patient quality time with our provider, please arrive at least 15 minutes before your scheduled appointment time.   Beginning January 23rd 2017 lab work for the [facility] will be done in the  Main lab at [facility] on 1st floor. If you have a lab appointment with the [facility] please come in thru the  Main Entrance and check in at the main information desk  You need to re-schedule your appointment should you arrive 10 or more minutes late.  We strive to give you quality time with our providers, and arriving late affects you and other patients whose appointments are after yours.  Also, if you no show three or more times for appointments you may be dismissed from the clinic at the providers discretion.     Again, thank you for choosing [facility].  Our hope is that these requests will decrease the amount of time that you wait before being seen by our physicians.       _____________________________________________________________  Should you have questions after your visit to [facility], please contact our office at [phone] between the hours of 8:30 a.m. and 4:30 p.m.  Voicemails left after 4:30 p.m. will not be returned until the following business day.  For prescription refill requests, have your pharmacy contact our office.         Resources For Cancer Patients and their Caregivers ? American Cancer Society: Can assist with transportation, wigs, general needs, runs Look Good Feel Better.        7698697374 ? Cancer Care: Provides financial assistance, online support  groups, medication/co-pay assistance.  1-800-813-HOPE 407-694-2269) ? [place] Assists [place] cancer patients and their families through emotional , educational and financial support.  (443) 407-1438 ? [place] DSS Where to apply for food stamps, Medicaid and utility assistance. (574)150-8488 ? [facility]: Transportation to medical appointments. 269-252-7443 ? Social Security Administration: May apply for disability if have a Stage IV cancer. 984-448-0326 602-430-7233 ? [employer], Disability and Transit Services: Assists with nutrition, care and transit needs. [place] Support Programs: @10RELATIVEDAYS @ > Cancer Support Group  2nd Tuesday of the month 1pm-2pm, Journey Room  > Creative Journey  3rd Tuesday of the month 1130am-1pm, Journey Room  > Look Good Feel Better  1st Wednesday of the month 10am-12 noon, Journey Room (Call [place] to register [phone])

## 2015-11-09 ENCOUNTER — Other Ambulatory Visit: Payer: Self-pay | Admitting: Cardiology

## 2015-11-09 NOTE — Telephone Encounter (Signed)
Rx(s) sent to pharmacy electronically.  

## 2015-11-24 DIAGNOSIS — Z1389 Encounter for screening for other disorder: Secondary | ICD-10-CM | POA: Diagnosis not present

## 2015-11-24 DIAGNOSIS — M5432 Sciatica, left side: Secondary | ICD-10-CM | POA: Diagnosis not present

## 2015-11-24 DIAGNOSIS — B029 Zoster without complications: Secondary | ICD-10-CM | POA: Diagnosis not present

## 2015-11-24 DIAGNOSIS — M1991 Primary osteoarthritis, unspecified site: Secondary | ICD-10-CM | POA: Diagnosis not present

## 2015-11-24 DIAGNOSIS — Z6832 Body mass index (BMI) 32.0-32.9, adult: Secondary | ICD-10-CM | POA: Diagnosis not present

## 2015-11-24 DIAGNOSIS — M5417 Radiculopathy, lumbosacral region: Secondary | ICD-10-CM | POA: Diagnosis not present

## 2015-11-25 ENCOUNTER — Other Ambulatory Visit (HOSPITAL_COMMUNITY): Payer: Self-pay | Admitting: Internal Medicine

## 2015-12-05 ENCOUNTER — Other Ambulatory Visit: Payer: Self-pay | Admitting: Cardiology

## 2016-01-04 ENCOUNTER — Other Ambulatory Visit: Payer: Self-pay | Admitting: Cardiology

## 2016-01-17 ENCOUNTER — Other Ambulatory Visit (HOSPITAL_COMMUNITY): Payer: Self-pay | Admitting: Internal Medicine

## 2016-01-17 DIAGNOSIS — Z780 Asymptomatic menopausal state: Secondary | ICD-10-CM

## 2016-01-27 ENCOUNTER — Ambulatory Visit (HOSPITAL_COMMUNITY)
Admission: RE | Admit: 2016-01-27 | Discharge: 2016-01-27 | Disposition: A | Discharge status: Home / Self Care | Payer: Medicare Other | Source: Ambulatory Visit | Attending: Internal Medicine | Admitting: Internal Medicine

## 2016-01-27 DIAGNOSIS — Z780 Asymptomatic menopausal state: Secondary | ICD-10-CM

## 2016-01-27 DIAGNOSIS — M85851 Other specified disorders of bone density and structure, right thigh: Secondary | ICD-10-CM | POA: Diagnosis not present

## 2016-02-06 ENCOUNTER — Other Ambulatory Visit: Payer: Self-pay | Admitting: Cardiology

## 2016-02-06 ENCOUNTER — Other Ambulatory Visit: Payer: Self-pay | Admitting: Cardiovascular Disease

## 2016-02-06 NOTE — Telephone Encounter (Signed)
Rx(s) sent to pharmacy electronically.  

## 2016-02-20 ENCOUNTER — Ambulatory Visit: Payer: Medicare Other | Admitting: Cardiology

## 2016-02-22 DIAGNOSIS — E782 Mixed hyperlipidemia: Secondary | ICD-10-CM | POA: Diagnosis not present

## 2016-02-22 DIAGNOSIS — R7309 Other abnormal glucose: Secondary | ICD-10-CM | POA: Diagnosis not present

## 2016-02-22 DIAGNOSIS — C50919 Malignant neoplasm of unspecified site of unspecified female breast: Secondary | ICD-10-CM | POA: Diagnosis not present

## 2016-02-22 DIAGNOSIS — Z1389 Encounter for screening for other disorder: Secondary | ICD-10-CM | POA: Diagnosis not present

## 2016-02-22 DIAGNOSIS — I10 Essential (primary) hypertension: Secondary | ICD-10-CM | POA: Diagnosis not present

## 2016-02-22 DIAGNOSIS — E6609 Other obesity due to excess calories: Secondary | ICD-10-CM | POA: Diagnosis not present

## 2016-02-22 DIAGNOSIS — Z6832 Body mass index (BMI) 32.0-32.9, adult: Secondary | ICD-10-CM | POA: Diagnosis not present

## 2016-02-22 DIAGNOSIS — E063 Autoimmune thyroiditis: Secondary | ICD-10-CM | POA: Diagnosis not present

## 2016-02-22 DIAGNOSIS — E039 Hypothyroidism, unspecified: Secondary | ICD-10-CM | POA: Diagnosis not present

## 2016-02-22 DIAGNOSIS — Z23 Encounter for immunization: Secondary | ICD-10-CM | POA: Diagnosis not present

## 2016-02-22 DIAGNOSIS — Z0000 Encounter for general adult medical examination without abnormal findings: Secondary | ICD-10-CM | POA: Diagnosis not present

## 2016-03-16 ENCOUNTER — Ambulatory Visit (INDEPENDENT_AMBULATORY_CARE_PROVIDER_SITE_OTHER): Payer: Medicare Other | Admitting: Cardiology

## 2016-03-16 ENCOUNTER — Encounter: Payer: Self-pay | Admitting: Cardiology

## 2016-03-16 VITALS — BP 158/78 | HR 61 | Ht 60.0 in | Wt 165.2 lb

## 2016-03-16 DIAGNOSIS — I2510 Atherosclerotic heart disease of native coronary artery without angina pectoris: Secondary | ICD-10-CM | POA: Diagnosis not present

## 2016-03-16 DIAGNOSIS — I10 Essential (primary) hypertension: Secondary | ICD-10-CM

## 2016-03-16 DIAGNOSIS — R600 Localized edema: Secondary | ICD-10-CM

## 2016-03-16 DIAGNOSIS — I119 Hypertensive heart disease without heart failure: Secondary | ICD-10-CM | POA: Diagnosis not present

## 2016-03-16 DIAGNOSIS — E784 Other hyperlipidemia: Secondary | ICD-10-CM | POA: Diagnosis not present

## 2016-03-16 DIAGNOSIS — E7849 Other hyperlipidemia: Secondary | ICD-10-CM

## 2016-03-16 DIAGNOSIS — E669 Obesity, unspecified: Secondary | ICD-10-CM

## 2016-03-16 NOTE — Progress Notes (Signed)
PATIENT: Angelica [name] MRN: [id number] DOB: 1933-02-15 PCP: [name]., MD  Clinic Note: Chief Complaint  Patient presents with  . Follow-up    pt states sometimes she feels a tightness and a squeezing feeling, some dizziness when getting up sometimes   . Shortness of Breath    some   . Edema    both feet ankles and legs    HPI: Angelica [name] is a 82 y.o. female with a PMH below who presents today for Essentially one year followup.  She was a former patient of Dr. Rollene Fare.  She had heart catheterization in 2006 showed really minimal CAD followed up by a Myoview 2013 that was negative for ischemia along with an echocardiogram in 2013 & last July that were essentially normal. She apparently had a cardiac arrest episode during partial thyroidectomy in the past, but has not had any further cardiac issues since.   I last saw her in August 2016 -- was doing fairly well.   Interval History: She comes in today doing relatively well.  She has been dealing with some allergy in baby upper respiratory symptoms of late, and has had some congestion as well as had headache and blurry vision.  Ava seems a little but out of sorts today however stating that she is had some swelling in her legs and sometimes feels some uneasy feeling with standing up, but these episodes are not long lasting, and not very frequent. She seems to be slightly worse historian and she was last year and less clear with her symptoms. She does have a swelling from history of lymphedema, and notes that her legs ache at the end of the day. But no claudication while walking. I can't tell if she is having any claudication.  The remainder of  Cardiovascular review of symptoms: positive for - dyspnea on exertion, edema and Mild palpitations; had a good sumer - butr now more SOB negative for - chest pain, irregular heartbeat, loss of consciousness, murmur, orthopnea, paroxysmal nocturnal dyspnea, rapid heart rate, shortness of  breath or Syncope/near syncope, TIA/amaurosis fugax : -- She contributes some of her shortness of breath to him having allergy issues with congestion and red eyes. 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 At home she likes do gardening and walking around the house. She also has to care for her husband.  Past Medical History:  Diagnosis Date  . Anemia   . Arthritis   . Breast cancer ([place]) 2011   left; no adjuvent therapy, Dr. Sonny Dandy  . Chronic bronchitis   . Colon polyps    Previous colonoscopy by Dr. Irving Shows  . Coronary artery disease, non-occlusive 2006   Roughly 40% RCA.; Negative Myoview in June 2013  . Diverticulosis of colon   . Gout   . Hemorrhoids   . HTN (hypertension)   . Hyperlipidemia   . Hypothyroid   . MI (myocardial infarction) 1973   By report.    Prior Cardiac Evaluation and Past Surgical History: Past Surgical History:  Procedure Laterality Date  . ABDOMINAL HYSTERECTOMY     TAH  . CARDIAC CATHETERIZATION  2006   Roughly 40% RCA lesion, otherwise normal.  . CATARACT EXTRACTION W/PHACO  07/02/2011   Procedure: CATARACT EXTRACTION PHACO AND INTRAOCULAR LENS PLACEMENT (IOC);  Surgeon: Williams Che, MD;  Location: [facility];  Service: Ophthalmology;  Laterality: Left;  CDE:3.45  . CATARACT EXTRACTION W/PHACO  04/17/2012   Procedure: CATARACT EXTRACTION PHACO AND INTRAOCULAR LENS PLACEMENT (IOC);  Surgeon: Tonny Branch, MD;  Location: [facility];  Service: Ophthalmology;  Laterality: Right;  CDE:13.40  . CHOLECYSTECTOMY    . COLONOSCOPY      ?3 years ago. Per pt, diverticulosis, hemorrhoids  . COLONOSCOPY  04/06/2011   SLF: 1. Sessile polyp at the ileocecal valve 2. Diverticulosis, moderate 3. Internal hemorrhoids, Large-causing rectal bleeding associated with straining. tubular adenoma.  [facility] HEMORRHOID SURGERY    . LAPAROSCOPIC LYSIS OF ADHESIONS   2008   Dr. Zella Richer with repair of incisional hernia noted -   . MASTECTOMY  2011   left  . NM MYOVIEW LTD  June 2013   No ischemia or infarction. Normal EF  . TRANSTHORACIC ECHOCARDIOGRAM  June 2013; 12/2013   a) Normal EF, no significant valve disease.; b) EF 50-55%, Gr1 DD, mild LVH.     Allergies  Allergen Reactions  . Bee Venom Anaphylaxis  . Ciprofloxacin Nausea And Vomiting  . Clindamycin Hcl     Does not know  . Flagyl [Metronidazole Hcl] Nausea And Vomiting  . Shellfish-Derived Products     Rash developed so patient has steered clear of any since last year  . Sulfa Antibiotics Nausea Only    Prior to Admission medications   Medication Sig Start Date End Date Taking? Authorizing Provider  allopurinol (ZYLOPRIM) 300 MG tablet Take 300 mg by mouth daily.     Yes Historical Provider, MD  alum & mag hydroxide-simeth (MAALOX/MYLANTA) 200-200-20 MG/5ML suspension Take 30 mLs by mouth every 6 (six) hours as needed for indigestion or heartburn (dyspepsia). 12/28/13  Yes Lezlie Octave Black, NP  amLODipine (NORVASC) 5 MG tablet TAKE ONE TABLET BY MOUTH ONCE DAILY. 01/04/16  Yes Leonie Man, MD  aspirin EC 81 MG tablet Take 81 mg by mouth daily.   Yes Historical Provider, MD  atorvastatin (LIPITOR) 20 MG tablet TAKE (1) TABLET BY MOUTH AT BEDTIME FOR CHOLESTEROL. 02/06/16  Yes Troy Sine, MD  enalapril (VASOTEC) 10 MG tablet Take 10 mg by mouth daily.   Yes Historical Provider, MD  furosemide (LASIX) 20 MG tablet Take 20 mg by mouth daily.    Yes Historical Provider, MD  levothyroxine (SYNTHROID, LEVOTHROID) 50 MCG tablet Take 50 mcg by mouth daily.    Yes Historical Provider, MD  metoprolol succinate (TOPROL-XL) 50 MG 24 hr tablet Take 50 mg by mouth daily. Take with or immediately following a meal.   Yes Historical Provider, MD  Multiple Vitamins-Minerals (MULTIVITAMINS THER. W/MINERALS) TABS Take 1 tablet by mouth daily.     Yes Historical Provider, MD  nitroGLYCERIN (NITROSTAT)  0.4 MG SL tablet Place 1 tablet (0.4 mg total) under the tongue every 5 (five) minutes as needed for chest pain. 01/05/14  Yes Luke K Kilroy, PA-C  pantoprazole (PROTONIX) 40 MG tablet TAKE ONE TABLET BY MOUTH DAILY. 02/06/16  Yes Leonie Man, MD  potassium chloride SA (K-DUR,KLOR-CON) 20 MEQ tablet Take 20 mEq by mouth daily.    Yes Historical Provider, MD  Propylene Glycol (SYSTANE BALANCE) 0.6 % SOLN Place 1 drop into both eyes 2 (two) times daily.   Yes Historical Provider, MD    Social History   Social History Narrative   She is a very pleasant married African American woman with 2 children and 3 grandchildren. She does with her husband who is retired Social research officer, government. [place] now [place]  [place] in the [place] area with a roughly 1/4 mile driveway. She usually walks a mile about twice a day, one by herself and 1 with her husband. She also enjoys doing gardening.    family history includes Breast cancer in her other and sister; Prostate cancer in her father.  ROS: A comprehensive Review of Systems - Negative except Symptoms noted in history of present illness as well as right ankle pain and swelling that may be well related to gout. edema is usually controlled with Lasix. Review of Systems  Constitutional: Negative for malaise/fatigue.  HENT: Positive for congestion. Negative for nosebleeds.   Eyes: Positive for blurred vision (Off-and-on).  Respiratory: Negative for cough and shortness of breath (More so associated with congestion).   Cardiovascular: Negative for claudication.       Both hands and feet well. Hands feel cold, not as bad as in the winter.  Gastrointestinal: Negative for blood in stool and melena.  Genitourinary: Negative for hematuria.  Musculoskeletal: Negative for joint pain.  Neurological: Positive for headaches.  Endo/Heme/Allergies: Does not bruise/bleed easily.  Psychiatric/Behavioral: Negative for memory loss. The patient is not nervous/anxious and does not have  insomnia.   All other systems reviewed and are negative.   Wt Readings from Last 3 Encounters:  03/16/16 74.9 kg (165 lb 3.2 oz)  11/02/15 74.8 kg (164 lb 12.8 oz)  04/05/15 76.3 kg (168 lb 3.2 oz)    PHYSICAL EXAM BP (!) 158/78   Pulse 61   Ht 5' (1.524 m)   Wt 74.9 kg (165 lb 3.2 oz)   BMI 32.26 kg/m  General appearance: alert, cooperative, appears stated age, mild distress and mildly obese Neck: no adenopathy, no carotid bruit, no JVD and supple, symmetrical, trachea midline Lungs: clear to auscultation bilaterally, normal percussion bilaterally and Nonlabored, good air movement Heart: normal apical impulse, regular rate and rhythm, S1, S2 normal and Soft 1/6 SEM at RUSB radiating to carotids; very diffuse tenderness along the left costochondral margin post parasternal and inframammary. Reproducible musculoskeletal pain Abdomen: soft, non-tender; bowel sounds normal; no masses,  no organomegaly Extremities: extremities normal, atraumatic, no cyanosis or edema and Mild increased swelling the right ankle but not warm to touch. Pulses: 2+ and symmetric - difficult to palpate radial pulses bilaterally - abnormal location; PT DP pulses normal bilaterally. Neurologic: Grossly normal  [id number] today: Yes Rate: 61, Rhythm: NSR, NSST-T wave abnormalities (likely related to artifact);; stable EKG   Recent Labs: PCP does Lipids   Chemistry      Component Value Date/Time   NA 137 11/02/2015 1052   K 3.7 11/02/2015 1052   CL 99 (L) 11/02/2015 1052   CO2 30 11/02/2015 1052   BUN 13 11/02/2015 1052   CREATININE 0.67 11/02/2015 1052   CREATININE 0.76 03/15/2011 1413      Component Value Date/Time   CALCIUM 8.9 11/02/2015 1052   ALKPHOS 71 11/02/2015 1052   AST 20 11/02/2015 1052   ALT 18 11/02/2015 1052   BILITOT 1.2 11/02/2015 1052   BILITOT 1.4 (H) 04/06/2015 1058      ASSESSMENT / PLAN: Overall relatively stable cardiac standpoint. Blood pressure is a bit elevated  today.  Problem List Items Addressed This Visit    Obesity (BMI 30-39.9) (Chronic)    Continue to stay active with exercise and monitor diet      Hypertensive cardiovascular disease- mild LVH, grade 1 diastolic dysfunction (Chronic)    I think probably some of her intermittent  dyspnea and chest, but is more related to hypertensive heart disease with some diastolic dysfunction as opposed to true active angina. Her blood pressure is not is well-controlled today is usual. However with her intermittent dizziness symptoms, more inclined to allow her to have some permissive hypertension as long as she is not symptomatic. Plan: Continue current dose of Toprol, enalapril, amlodipine with standing Lasix.       Relevant Orders   EKG 12-Lead   Hyperlipidemia (Chronic)   Essential hypertension (Chronic)    Not very well controlled today. But at home her pressures seem to be better than they are now. She states that last PCP visit her blood pressure was 120/80.  Because of dizziness, I would prefer to allow some mild permissive hypertension. Would not act on this current blood pressure being elevated today was 1 week in time. Pressures are still elevated above 0000000 systolic, would consider increasing amlodipine to 10 mg daily.      Relevant Orders   EKG 12-Lead   Coronary artery disease, non-occlusive - Primary (Chronic)    CAD noted on cath in June 2013. Clearly has cardiac risk factors. Plan: Continue aspirin, statin, beta blocker, ACE inhibitor and calcium blocker.      Relevant Orders   EKG 12-Lead   Bilateral lower extremity edema (Chronic)    Swelling is overall better today. Right foot is definitely larger than left, but really not that swollen. Continue current dose Lasix with when necessary additional dose.  DURING THE SUMMER WHEN NOT WEARING SUPPORT STOCKING STOP EVERY COUPLE HOURS AND PROP YOUR LEGS UP.      Relevant Orders   EKG 12-Lead    Other Visit Diagnoses   None.       Orders Placed This Encounter  Procedures  . EKG 12-Lead    Order Specific Question:   Where should this test be performed    Answer:   Other   No orders of the defined types were placed in this encounter.   No other changes with medications DURING THE SUMMER WHEN NOT WEARING SUPPORT STOCKING STOP EVERY COUPLE HOURS AND PROP YOUR LEGS UP.  Your physician wants you to follow-up in 12 months WITH  Dr Ellyn Hack.    Glenetta Hew, M.D., M.S. Interventional Cardiologist   Pager # [phone]

## 2016-03-16 NOTE — Patient Instructions (Addendum)
DURING THE SUMMER WHEN NOT WEARING SUPPORT STOCKING STOP EVERY COUPLE HOURS AND PROP YOUR LEGS UP.   NO OTHER CHANGES WITH MEDICATIONS   Your physician wants you to follow-up in: [address] [place].  You will receive a reminder letter in the mail two months in advance. If you don't receive a letter, please call our office to schedule the follow-up appointment.   If you need a refill on your cardiac medications before your next appointment, please call your pharmacy.

## 2016-03-18 ENCOUNTER — Encounter: Payer: Self-pay | Admitting: Cardiology

## 2016-03-18 NOTE — Assessment & Plan Note (Signed)
Not very well controlled today. But at home her pressures seem to be better than they are now. She states that last PCP visit her blood pressure was 120/80.  Because of dizziness, I would prefer to allow some mild permissive hypertension. Would not act on this current blood pressure being elevated today was 1 week in time. Pressures are still elevated above 0000000 systolic, would consider increasing amlodipine to 10 mg daily.

## 2016-03-18 NOTE — Assessment & Plan Note (Signed)
CAD noted on cath in June 2013. Clearly has cardiac risk factors. Plan: Continue aspirin, statin, beta blocker, ACE inhibitor and calcium blocker.

## 2016-03-18 NOTE — Assessment & Plan Note (Addendum)
Swelling is overall better today. Right foot is definitely larger than left, but really not that swollen. Continue current dose Lasix with when necessary additional dose.  DURING THE SUMMER WHEN NOT WEARING SUPPORT STOCKING STOP EVERY COUPLE HOURS AND PROP YOUR LEGS UP.

## 2016-03-18 NOTE — Assessment & Plan Note (Signed)
Continue to stay active with exercise and monitor diet

## 2016-03-18 NOTE — Assessment & Plan Note (Signed)
I think probably some of her intermittent dyspnea and chest, but is more related to hypertensive heart disease with some diastolic dysfunction as opposed to true active angina. Her blood pressure is not is well-controlled today is usual. However with her intermittent dizziness symptoms, more inclined to allow her to have some permissive hypertension as long as she is not symptomatic. Plan: Continue current dose of Toprol, enalapril, amlodipine with standing Lasix.

## 2016-04-04 ENCOUNTER — Other Ambulatory Visit: Payer: Self-pay | Admitting: Cardiology

## 2016-04-04 ENCOUNTER — Other Ambulatory Visit: Payer: Self-pay | Admitting: Cardiovascular Disease

## 2016-05-07 ENCOUNTER — Ambulatory Visit (HOSPITAL_COMMUNITY): Payer: Medicare Other | Admitting: Hematology & Oncology

## 2016-05-07 ENCOUNTER — Other Ambulatory Visit (HOSPITAL_COMMUNITY): Payer: Medicare Other

## 2016-05-16 DIAGNOSIS — E063 Autoimmune thyroiditis: Secondary | ICD-10-CM | POA: Diagnosis not present

## 2016-05-16 DIAGNOSIS — E042 Nontoxic multinodular goiter: Secondary | ICD-10-CM | POA: Diagnosis not present

## 2016-05-24 DIAGNOSIS — E785 Hyperlipidemia, unspecified: Secondary | ICD-10-CM | POA: Diagnosis not present

## 2016-05-24 DIAGNOSIS — I10 Essential (primary) hypertension: Secondary | ICD-10-CM | POA: Diagnosis not present

## 2016-05-24 DIAGNOSIS — E063 Autoimmune thyroiditis: Secondary | ICD-10-CM | POA: Diagnosis not present

## 2016-05-24 DIAGNOSIS — E039 Hypothyroidism, unspecified: Secondary | ICD-10-CM | POA: Diagnosis not present

## 2016-05-24 DIAGNOSIS — E042 Nontoxic multinodular goiter: Secondary | ICD-10-CM | POA: Diagnosis not present

## 2016-06-01 ENCOUNTER — Other Ambulatory Visit: Payer: Self-pay | Admitting: Cardiology

## 2016-06-08 ENCOUNTER — Other Ambulatory Visit: Payer: Self-pay | Admitting: Cardiovascular Disease

## 2016-06-08 NOTE — Telephone Encounter (Signed)
Rx has been sent to the pharmacy electronically. ° °

## 2016-06-12 ENCOUNTER — Encounter (HOSPITAL_COMMUNITY): Payer: Medicare Other

## 2016-06-12 ENCOUNTER — Encounter (HOSPITAL_COMMUNITY): Payer: Self-pay | Admitting: Hematology & Oncology

## 2016-06-12 ENCOUNTER — Encounter (HOSPITAL_COMMUNITY): Payer: Medicare Other | Attending: Hematology & Oncology | Admitting: Hematology & Oncology

## 2016-06-12 DIAGNOSIS — Z853 Personal history of malignant neoplasm of breast: Secondary | ICD-10-CM

## 2016-06-12 DIAGNOSIS — Z171 Estrogen receptor negative status [ER-]: Secondary | ICD-10-CM

## 2016-06-12 DIAGNOSIS — M79606 Pain in leg, unspecified: Secondary | ICD-10-CM | POA: Diagnosis not present

## 2016-06-12 DIAGNOSIS — E039 Hypothyroidism, unspecified: Secondary | ICD-10-CM

## 2016-06-12 DIAGNOSIS — C50912 Malignant neoplasm of unspecified site of left female breast: Secondary | ICD-10-CM | POA: Diagnosis not present

## 2016-06-12 LAB — CBC WITH DIFFERENTIAL/PLATELET
BASOS ABS: 0.1 10*3/uL (ref 0.0–0.1)
Basophils Relative: 1 %
Eosinophils Absolute: 0.2 10*3/uL (ref 0.0–0.7)
Eosinophils Relative: 3 %
HEMATOCRIT: 41.6 % (ref 36.0–46.0)
Hemoglobin: 13.2 g/dL (ref 12.0–15.0)
LYMPHS ABS: 3 10*3/uL (ref 0.7–4.0)
LYMPHS PCT: 36 %
MCH: 31.1 pg (ref 26.0–34.0)
MCHC: 31.7 g/dL (ref 30.0–36.0)
MCV: 98.1 fL (ref 78.0–100.0)
MONO ABS: 0.3 10*3/uL (ref 0.1–1.0)
MONOS PCT: 4 %
NEUTROS ABS: 4.7 10*3/uL (ref 1.7–7.7)
Neutrophils Relative %: 56 %
Platelets: 196 10*3/uL (ref 150–400)
RBC: 4.24 MIL/uL (ref 3.87–5.11)
RDW: 14.3 % (ref 11.5–15.5)
WBC: 8.3 10*3/uL (ref 4.0–10.5)

## 2016-06-12 LAB — COMPREHENSIVE METABOLIC PANEL
ALT: 14 U/L (ref 14–54)
AST: 20 U/L (ref 15–41)
Albumin: 4.1 g/dL (ref 3.5–5.0)
Alkaline Phosphatase: 66 U/L (ref 38–126)
Anion gap: 6 (ref 5–15)
BILIRUBIN TOTAL: 1.1 mg/dL (ref 0.3–1.2)
BUN: 15 mg/dL (ref 6–20)
CO2: 31 mmol/L (ref 22–32)
Calcium: 9.5 mg/dL (ref 8.9–10.3)
Chloride: 99 mmol/L — ABNORMAL LOW (ref 101–111)
Creatinine, Ser: 0.75 mg/dL (ref 0.44–1.00)
GFR calc Af Amer: >60 mL/min (ref >60)
Glucose, Bld: 105 mg/dL — ABNORMAL HIGH (ref 65–99)
Potassium: 4.6 mmol/L (ref 3.5–5.1)
Sodium: 136 mmol/L (ref 135–145)
TOTAL PROTEIN: 8.3 g/dL — AB (ref 6.5–8.1)

## 2016-06-12 NOTE — Progress Notes (Signed)
[name]., MD [place] [id number]  Multicentric stage I left breast cancer, triple negative. left total mastectomy with sentinel node sampling on 10/11/ 2011 at which time 0.4 cm and 0.2 cm tumors were found, both triple negative. She received no postoperative therapy.  10/21/2013 R mammogram at [facility], Category 1  CURRENT THERAPY: Observation  INTERVAL HISTORY: Angelica [name] 83 y.o. female returns for follow-up of a stage I triple negative breast cancer. Her original diagnosis was in 2011. She was here last at the end of May.   Angelica [name] returns to the [facility] today accompanied by her husband. Her husband is also a patient in the [facility].  She had a good holiday with her family. Her appetite is good.   Her right leg has been swelling off and on for a while but has been more frequent lately and the "vein pops up." She is not sure if it is arthritis or what is happening. Her left leg hurts, while her right leg feels more numb. She believes she is putting more weight on her right leg because her left leg hurts. She also reports numbness in her fingers and toes. She has discussed this with her physician. She follows with Dr. Ellyn Hack.   She has her mammograms done in [place] at the [facility] and continues to get these yearly, last on 10/27/2015..  She performs regular self breast exams.  She denies any changes in her appetite. She notes that she has has no changes in her energy or activity level.    MEDICAL HISTORY: Past Medical History:  Diagnosis Date  . Anemia   . Arthritis   . Breast cancer ([place]) 2011   left; no adjuvent therapy, Dr. Sonny Dandy  . Chronic bronchitis   . Colon polyps    Previous colonoscopy by Dr. Irving Shows  . Coronary artery disease, non-occlusive 2006   Roughly 40% RCA.; Negative Myoview in June 2013  . Diverticulosis of colon   . Gout   . Hemorrhoids   . HTN (hypertension)   . Hyperlipidemia   .  Hypothyroid   . MI (myocardial infarction) 1973   By report.    has Diverticulosis of colon; Diverticulitis of colon; Breast cancer ([place]); Personal history of colonic polyps; LLQ pain; Rectal bleed; Diarrhea; Hypertensive cardiovascular disease- mild LVH, grade 1 diastolic dysfunction; Hypokalemia; Hypothyroidism; Hyperlipidemia; Obesity (BMI 30-39.9); Coronary artery disease, non-occlusive; Bilateral lower extremity edema; Essential hypertension; and Abnormal LFTs on her problem list.     is allergic to bee venom; ciprofloxacin; clindamycin hcl; flagyl [metronidazole hcl]; shellfish-derived products; and sulfa antibiotics.  Ms. Pankiewicz does not currently have medications on file.  SURGICAL HISTORY: Past Surgical History:  Procedure Laterality Date  . ABDOMINAL HYSTERECTOMY     TAH  . CARDIAC CATHETERIZATION  2006   Roughly 40% RCA lesion, otherwise normal.  . CATARACT EXTRACTION W/PHACO  07/02/2011   Procedure: CATARACT EXTRACTION PHACO AND INTRAOCULAR LENS PLACEMENT (IOC);  Surgeon: Williams Che, MD;  Location: [facility];  Service: Ophthalmology;  Laterality: Left;  CDE:3.45  . CATARACT EXTRACTION W/PHACO  04/17/2012   Procedure: CATARACT EXTRACTION PHACO AND INTRAOCULAR LENS PLACEMENT (IOC);  Surgeon: Tonny Branch, MD;  Location: [facility];  Service: Ophthalmology;  Laterality: Right;  CDE:13.40  . CHOLECYSTECTOMY    . COLONOSCOPY      ?3 years ago. Per pt, diverticulosis, hemorrhoids  . COLONOSCOPY  04/06/2011   SLF: 1. Sessile polyp at the ileocecal valve  2. Diverticulosis, moderate 3. Internal hemorrhoids, Large-causing rectal bleeding associated with straining. tubular adenoma.  Marland Kitchen HEMORRHOID SURGERY    . LAPAROSCOPIC LYSIS OF ADHESIONS  2008   Dr. Zella Richer with repair of incisional hernia noted -   . MASTECTOMY  2011   left  . NM MYOVIEW LTD  June 2013   No ischemia or infarction. Normal EF  . TRANSTHORACIC ECHOCARDIOGRAM  June 2013; 12/2013   a) Normal EF, no significant valve  disease.; b) EF 50-55%, Gr1 DD, mild LVH.     SOCIAL HISTORY: Social History   Social History  . Marital status: Married    Spouse name: N/A  . Number of children: 2  . Years of education: N/A   Occupational History  . retired Retired   Social History Main Topics  . Smoking status: Never Smoker  . Smokeless tobacco: Never Used  . Alcohol use No  . Drug use: No  . Sexual activity: Not Currently   Other Topics Concern  . Not on file   Social History Narrative   She is a very pleasant married African American woman with 2 children and 3 grandchildren. She does with her husband who is retired Social research officer, government. They now live on a large farm in the [place] area with a roughly 1/4 mile driveway. She usually walks a mile about twice a day, one by herself and 1 with her husband. She also enjoys doing gardening.    FAMILY HISTORY: Family History  Problem Relation Age of Onset  . Prostate cancer Father   . Breast cancer Sister   . Breast cancer Other     paternal niece  . Colon cancer Neg Hx   . Anesthesia problems Neg Hx   . Hypotension Neg Hx   . Malignant hyperthermia Neg Hx   . Pseudochol deficiency Neg Hx   . Liver disease Neg Hx    Review of Systems  Constitutional: Negative.   HENT: Negative.   Eyes: Negative.   Respiratory: Negative.   Cardiovascular: Positive for leg swelling (Right leg swelling).  Gastrointestinal: Negative.   Genitourinary: Negative.   Musculoskeletal: Negative.        Left leg pain  Skin: Negative.   Neurological: Negative.        Right leg numbness  Endo/Heme/Allergies: Negative.   Psychiatric/Behavioral: Negative.   All other systems reviewed and are negative. 14 point review of systems was performed and is negative except as detailed under history of present illness and above   PHYSICAL EXAMINATION  ECOG PERFORMANCE STATUS: 1 - Symptomatic but completely ambulatory  Vitals:   06/12/16 1337  BP: 134/77  Pulse: 72  Resp: 16  Temp:  98 F (36.7 C)    Physical Exam  Constitutional: She is oriented to person, place, and time and well-developed, well-nourished, and in no distress.  HENT:  Head: Normocephalic and atraumatic.  Mouth/Throat: Oropharynx is clear and moist.  Eyes: Conjunctivae and EOM are normal. Pupils are equal, round, and reactive to light. No scleral icterus.  Neck: Normal range of motion. Neck supple.  Cardiovascular: Normal rate, regular rhythm and normal heart sounds.   Pulmonary/Chest: Effort normal and breath sounds normal.    Abdominal: Soft. Bowel sounds are normal. She exhibits no distension and no mass. There is no tenderness. There is no rebound and no guarding.  Musculoskeletal: Normal range of motion.  Lymphadenopathy:    She has no cervical adenopathy.  Neurological: She is alert and oriented to person, place, and time.  Gait normal.  Skin: Skin is warm and dry.  Nursing note and vitals reviewed.   LABORATORY DATA: I have reviewed the data as listed. CBC    Component Value Date/Time   WBC 8.3 06/12/2016 1248   RBC 4.24 06/12/2016 1248   HGB 13.2 06/12/2016 1248   HCT 41.6 06/12/2016 1248   PLT 196 06/12/2016 1248   MCV 98.1 06/12/2016 1248   MCH 31.1 06/12/2016 1248   MCHC 31.7 06/12/2016 1248   RDW 14.3 06/12/2016 1248   LYMPHSABS 3.0 06/12/2016 1248   MONOABS 0.3 06/12/2016 1248   EOSABS 0.2 06/12/2016 1248   BASOSABS 0.1 06/12/2016 1248   CMP     Component Value Date/Time   NA 136 06/12/2016 1248   K 4.6 06/12/2016 1248   CL 99 (L) 06/12/2016 1248   CO2 31 06/12/2016 1248   GLUCOSE 105 (H) 06/12/2016 1248   BUN 15 06/12/2016 1248   CREATININE 0.75 06/12/2016 1248   CREATININE 0.76 03/15/2011 1413   CALCIUM 9.5 06/12/2016 1248   PROT 8.3 (H) 06/12/2016 1248   PROT 7.4 04/06/2015 1058   ALBUMIN 4.1 06/12/2016 1248   ALBUMIN 4.4 04/06/2015 1058   AST 20 06/12/2016 1248   ALT 14 06/12/2016 1248   ALKPHOS 66 06/12/2016 1248   BILITOT 1.1 06/12/2016 1248    BILITOT 1.4 (H) 04/06/2015 1058   GFRNONAA >60 06/12/2016 1248   GFRAA >60 06/12/2016 1248     ASSESSMENT and THERAPY PLAN:  Multicentric stage I left breast cancer, triple negative. left total mastectomy with sentinel node sampling on 10/11/ 2011 at which time 0.4 cm and 0.2 cm tumors were found, both triple negative. She received no postoperative therapy. Hyperbilirubinemia  She is doing well overall. She is active and without evidence of recurrence. We will schedule her for her next mammogram in May.   Labs reviewed. Results are noted above. Bilirubin is WNL.   Breast exam was performed today and without evidence of recurrence.  Last mammogram on 10/27/2015. Last bone density on 01/27/2016.  She is not interested in having her leg undergo ultrasound imaging at this time. She follows with her PCP.  She will return for follow up in 1 year.  NCCN guidelines recommends the following surveillance for invasive breast cancer:  A. History and Physical exam every 4-6 months for 5 years and then every 12 months.  B. Mammography every 12 months  C. Women on Tamoxifen: annual gynecologic assessment every 12 months if uterus is present.  D. Women on aromatase inhibitor or who experience ovarian failure secondary to treatment should have monitoring of bone health with a bone mineral density determination at baseline and periodically thereafter.  E. Assess and encourage adherence to adjuvant endocrine therapy.  F. Evidence suggests that active lifestyle and achieving and maintaining an ideal body weight (20-25 BMI) may lead to optimal breast cancer outcomes.   All questions were answered. The patient knows to call the clinic with any problems, questions or concerns. We can certainly see the patient much sooner if necessary.   This document serves as a record of services personally performed by Ancil Linsey, MD. It was created on her behalf by Arlyce Harman, a trained medical scribe. The  creation of this record is based on the scribe's personal observations and the provider's statements to them. This document has been checked and approved by the attending provider.   I have reviewed the above documentation for accuracy and completeness, and I agree with the above.  This note was electronically signed.   Kelby Fam. Penland, MD 06/12/2016

## 2016-06-12 NOTE — Patient Instructions (Addendum)
Angelica [name] at [facility] Discharge Instructions  RECOMMENDATIONS MADE BY THE CONSULTANT AND ANY TEST RESULTS WILL BE SENT TO YOUR REFERRING PHYSICIAN.  You were seen today by Dr. Whitney Muse Follow up in one year  Thank you for choosing [place] at [facility] to provide your oncology and hematology care.  To afford each patient quality time with our provider, please arrive at least 15 minutes before your scheduled appointment time.    If you have a lab appointment with the [facility] please come in thru the  Main Entrance and check in at the main information desk  You need to re-schedule your appointment should you arrive 10 or more minutes late.  We strive to give you quality time with our providers, and arriving late affects you and other patients whose appointments are after yours.  Also, if you no show three or more times for appointments you may be dismissed from the clinic at the providers discretion.     Again, thank you for choosing [facility].  Our hope is that these requests will decrease the amount of time that you wait before being seen by our physicians.       _____________________________________________________________  Should you have questions after your visit to [facility], please contact our office at [phone] between the hours of 8:30 a.m. and 4:30 p.m.  Voicemails left after 4:30 p.m. will not be returned until the following business day.  For prescription refill requests, have your pharmacy contact our office.       Resources For Cancer Patients and their Caregivers ? American Cancer Society: Can assist with transportation, wigs, general needs, runs Look Good Feel Better.        480-822-4229 ? Cancer Care: Provides financial assistance, online support groups, medication/co-pay assistance.  1-800-813-HOPE 828-861-6989) ? [place] Assists [place] cancer  patients and their families through emotional , educational and financial support.  913-694-3752 ? [place] DSS Where to apply for food stamps, Medicaid and utility assistance. 228-240-1473 ? [facility]: Transportation to medical appointments. 602 316 9066 ? Social Security Administration: May apply for disability if have a Stage IV cancer. 651-032-3833 713-476-2092 ? [employer], Disability and Transit Services: Assists with nutrition, care and transit needs. [place] Support Programs: @10RELATIVEDAYS @ > Cancer Support Group  2nd Tuesday of the month 1pm-2pm, Journey Room  > Creative Journey  3rd Tuesday of the month 1130am-1pm, Journey Room  > Look Good Feel Better  1st Wednesday of the month 10am-12 noon, Journey Room (Call [place] to register [phone])

## 2016-06-17 ENCOUNTER — Encounter (HOSPITAL_COMMUNITY): Payer: Self-pay | Admitting: Hematology & Oncology

## 2016-10-30 DIAGNOSIS — Z1231 Encounter for screening mammogram for malignant neoplasm of breast: Secondary | ICD-10-CM | POA: Diagnosis not present

## 2016-11-14 DIAGNOSIS — R928 Other abnormal and inconclusive findings on diagnostic imaging of breast: Secondary | ICD-10-CM | POA: Diagnosis not present

## 2016-12-14 ENCOUNTER — Ambulatory Visit (INDEPENDENT_AMBULATORY_CARE_PROVIDER_SITE_OTHER): Payer: Medicare Other | Admitting: Cardiology

## 2016-12-14 VITALS — BP 159/90 | HR 80 | Ht 60.0 in | Wt 164.4 lb

## 2016-12-14 DIAGNOSIS — R600 Localized edema: Secondary | ICD-10-CM

## 2016-12-14 DIAGNOSIS — I110 Hypertensive heart disease with heart failure: Secondary | ICD-10-CM

## 2016-12-14 DIAGNOSIS — I5032 Chronic diastolic (congestive) heart failure: Secondary | ICD-10-CM

## 2016-12-14 DIAGNOSIS — I10 Essential (primary) hypertension: Secondary | ICD-10-CM | POA: Diagnosis not present

## 2016-12-14 DIAGNOSIS — E669 Obesity, unspecified: Secondary | ICD-10-CM | POA: Diagnosis not present

## 2016-12-14 DIAGNOSIS — I2510 Atherosclerotic heart disease of native coronary artery without angina pectoris: Secondary | ICD-10-CM | POA: Diagnosis not present

## 2016-12-14 MED ORDER — POTASSIUM CHLORIDE CRYS ER 20 MEQ PO TBCR: 20.0000 meq | EXTENDED_RELEASE_TABLET | Freq: Every day | ORAL | 6 refills | Status: DC

## 2016-12-14 NOTE — Progress Notes (Signed)
PCP: [name], MD  Clinic Note: Chief Complaint  Patient presents with  . Follow-up    Cardiac RFs, Swelling    HPI: Angelica [name] is a 83 y.o. female with a PMH below who presents today for 9 month follow-up.  She was a former patient of Dr. Rollene Fare. She had heart catheterization in 2006 showed really minimal CAD followed up by a Myoview 2013 that was negative for ischemia along with an echocardiogram in 2013 & last July that were essentially normal. She apparently had a cardiac arrest episode during partial thyroidectomy in the past, but has not had any further cardiac issues since.   Angelica [name] was last seen on 03/16/2016  Recent Hospitalizations: none  Studies Personally Reviewed - (if available, images/films reviewed: From Epic Chart or Care Everywhere)  none  Interval History: Angelica [name] presents today, a bit confused b/c she thought that she was due for her annual f/u.  She has no obvious complaints.  She continues to stay active with gardening - but avoids the heat as much as possible. She only notes palpitations if she is dehydrated / too hot --> these usually calm down once she gets inside & cools down with a drink.  She did have one such episode a few weeks ago when she felt a bit dizzy as though she may pass out, but it resolved once she got cooled down in the car with AC.  Her swelling is well controlled with low dose diuretic & support stockings.   She is happy to have the "allergy season" almost over -- had a tough spring.    No chest pain or shortness of breath with rest or exertion. No PND, orthopnea or edema. No syncope/near syncope. No TIA/amaurosis fugax symptoms.  No melena, hematochezia, hematuria, or epstaxis. No claudication.  BP readings @ home usually 120-130/~70s.  She was a bit stressed out by the drive in this AM - starting to worry about her husband's driving.  ROS: A comprehensive was performed. Review of Systems  Constitutional: Negative for  chills, fever and malaise/fatigue.  HENT: Positive for congestion (during allergy season).   Respiratory: Positive for wheezing (during allergy season). Negative for cough, hemoptysis and shortness of breath.   Musculoskeletal: Negative for falls and joint pain.  Neurological: Positive for dizziness (episode noted in HPI) and weakness (once episode noted inHPI). Negative for seizures and loss of consciousness.  Endo/Heme/Allergies: Positive for environmental allergies.  Psychiatric/Behavioral: Negative for depression and memory loss. The patient is not nervous/anxious and does not have insomnia.   All other systems reviewed and are negative.  I have reviewed and (if needed) personally updated the patient's problem list, medications, allergies, past medical and surgical history, social and family history.   Past Medical History:  Diagnosis Date  . Anemia   . Arthritis   . Breast cancer ([place]) 2011   left; no adjuvent therapy, Dr. Sonny Dandy  . Chronic bronchitis   . Colon polyps    Previous colonoscopy by Dr. Irving Shows  . Coronary artery disease, non-occlusive 2006   Roughly 40% RCA.; Negative Myoview in June 2013  . Diverticulosis of colon   . Gout   . Hemorrhoids   . HTN (hypertension)   . Hyperlipidemia   . Hypothyroid   . MI (myocardial infarction) ([place]) 1973   By report.    Past Surgical History:  Procedure Laterality Date  . ABDOMINAL HYSTERECTOMY     TAH  . CARDIAC CATHETERIZATION  2006  Roughly 40% RCA lesion, otherwise normal.  . CATARACT EXTRACTION W/PHACO  07/02/2011   Procedure: CATARACT EXTRACTION PHACO AND INTRAOCULAR LENS PLACEMENT (IOC);  Surgeon: Williams Che, MD;  Location: [facility];  Service: Ophthalmology;  Laterality: Left;  CDE:3.45  . CATARACT EXTRACTION W/PHACO  04/17/2012   Procedure: CATARACT EXTRACTION PHACO AND INTRAOCULAR LENS PLACEMENT (IOC);  Surgeon: Tonny Branch, MD;  Location: [facility];  Service: Ophthalmology;  Laterality: Right;  CDE:13.40  .  CHOLECYSTECTOMY    . COLONOSCOPY      ?3 years ago. Per pt, diverticulosis, hemorrhoids  . COLONOSCOPY  04/06/2011   SLF: 1. Sessile polyp at the ileocecal valve 2. Diverticulosis, moderate 3. Internal hemorrhoids, Large-causing rectal bleeding associated with straining. tubular adenoma.  Marland Kitchen HEMORRHOID SURGERY    . LAPAROSCOPIC LYSIS OF ADHESIONS  2008   Dr. Zella Richer with repair of incisional hernia noted -   . MASTECTOMY  2011   left  . NM MYOVIEW LTD  June 2013   No ischemia or infarction. Normal EF  . TRANSTHORACIC ECHOCARDIOGRAM  June 2013; 12/2013   a) Normal EF, no significant valve disease.; b) EF 50-55%, Gr1 DD, mild LVH.     No outpatient prescriptions have been marked as taking for the 12/14/16 encounter (Office Visit) with Leonie Man, MD.    Allergies  Allergen Reactions  . Bee Venom Anaphylaxis  . Ciprofloxacin Nausea And Vomiting  . Clindamycin Hcl     Does not know  . Flagyl [Metronidazole Hcl] Nausea And Vomiting  . Shellfish-Derived Products     Rash developed so patient has steered clear of any since last year  . Sulfa Antibiotics Nausea Only    Social History   Social History  . Marital status: Married    Spouse name: N/A  . Number of children: 2  . Years of education: N/A   Occupational History  . retired Retired   Social History Main Topics  . Smoking status: Never Smoker  . Smokeless tobacco: Never Used  . Alcohol use No  . Drug use: No  . Sexual activity: Not Currently   Other Topics Concern  . None   Social History Narrative   She is a very pleasant married African American woman with 2 children and 3 grandchildren. She does with her husband who is retired Social research officer, government. They now live on a large farm in the [place] area with a roughly 1/4 mile driveway. She usually walks a mile about twice a day, one by herself and 1 with her husband. She also enjoys doing gardening.    family history includes Breast cancer in her other and sister;  Prostate cancer in her father.  Wt Readings from Last 3 Encounters:  12/14/16 164 lb 6.4 oz (74.6 kg)  06/12/16 165 lb 3.2 oz (74.9 kg)  03/16/16 165 lb 3.2 oz (74.9 kg)    PHYSICAL EXAM BP (!) 159/90   Pulse 80   Ht 5' (1.524 m)   Wt 164 lb 6.4 oz (74.6 kg)   BMI 32.11 kg/m  General appearance: alert, cooperative, appears stated age, no distress. mildy obese. Well groomed. HEENT: [place]/AT, EOMI, MMM, anicteric sclera Neck: no adenopathy, no carotid bruit and no JVD Lungs: clear to auscultation bilaterally, normal percussion bilaterally and non-labored Heart: regular rate and rhythm, S1 &S2 normal,\; no  click, rub or gallop; soft 1/6 SEM @ RUSB-> carotids Abdomen: soft, non-tender; bowel sounds normal; no masses,  no organomegaly; non-displaced PMI Extremities: extremities normal, atraumatic, no cyanosis;  edema - trace Pulses: 2+ and symmetric;  Skin: mobility and turgor normal, no evidence of bleeding or bruising and no lesions noted  Neurologic: Mental status: Alert & oriented x 3, thought content appropriate; non-focal exam.  Pleasant mood & affect.   Adult ECG Report  Rate: 80 ;  Rhythm: normal sinus rhythm, premature ventricular contractions (PVC) and 1 AVB (200), nonspecific ST-T wave changes.;   Narrative Interpretation: Stable EKG   Other studies Reviewed: Additional studies/ records that were reviewed today include:  Recent Labs:   No results found for: CHOL, HDL, LDLCALC, LDLDIRECT, TRIG, CHOLHDL - followed by PCP Lab Results  Component Value Date   CREATININE 0.75 06/12/2016   BUN 15 06/12/2016   NA 136 06/12/2016   K 4.6 06/12/2016   CL 99 (L) 06/12/2016   CO2 31 06/12/2016    ASSESSMENT / PLAN: Problem List Items Addressed This Visit    Bilateral lower extremity edema (Chronic)    Well-controlled with support stockings. She takes her standing low-dose diuretic, but has not required any additional dosing.      Relevant Orders   EKG 12-Lead   Coronary  artery disease, non-occlusive (Chronic)    No anginal symptoms. Minimal CAD noted on cath in June 2013. She is on aspirin, statin, beta blocker, ACE inhibitor and calcium channel blocker.      Essential hypertension (Chronic)    As is usually the case is comes into the clinic, her blood pressure is high, but she tells me home that they're relatively well-controlled. Since we are trying to avoid orthostatic symptoms, and to continue just to monitor. If she says that her blood pressure going up at home, we will probably increase amlodipine to 10mg .      Relevant Orders   EKG 12-Lead   Hypertensive cardiovascular disease- mild LVH, grade 1 diastolic dysfunction - Primary (Chronic)    Not really having any notable symptoms to suggest diastolic heart failure. Blood pressure is low but high today, but she tells me home is better. In order to keep things simple, I will continue her current medications but asked that she monitor pressures at home to ensure the narrowing within the range of SBP 120-140 mmHg.  She is on a decent dose of amlodipine as well as enalapril and metoprolol. She is on a standing loop diuretic.      Relevant Orders   EKG 12-Lead   Obesity (BMI 30-39.9) (Chronic)   Relevant Orders   EKG 12-Lead      Current medicines are reviewed at length with the patient today. (+/- concerns) n/a The following changes have been made: n/a  Patient Instructions  NO CHANGE WITH CURRENT MEDICATIONS     KEEP AN  CHECK ON BLOOD PRESSURE - THE SYSTOLIC NUMBER ( TOP NUMBER) SHOULD RANGE BETWEEN 130-150.     Your physician wants you to follow-up in AUG 2019 [place].You will receive a reminder letter in the mail two months in advance. If you don't receive a letter, please call our office to schedule the follow-up appointment.   If you need a refill on your cardiac medications before your next appointment, please call your pharmacy.    Studies Ordered:   Orders Placed  This Encounter  Procedures  . EKG 12-Lead   We will delay one-year follow-up to make a 14 month in order for her to schedule annual follow-up Of the same time frame as her husband's follow-up.   Glenetta Hew, M.D., M.S. Interventional Cardiologist  Pager # [phone] Phone # [phone] [address] [name], [place] [address]

## 2016-12-14 NOTE — Patient Instructions (Signed)
NO CHANGE WITH CURRENT MEDICATIONS     KEEP AN  CHECK ON BLOOD PRESSURE - THE SYSTOLIC NUMBER ( TOP NUMBER) SHOULD RANGE BETWEEN 130-150.     Your physician wants you to follow-up in AUG 2019 [place].You will receive a reminder letter in the mail two months in advance. If you don't receive a letter, please call our office to schedule the follow-up appointment.   If you need a refill on your cardiac medications before your next appointment, please call your pharmacy.

## 2016-12-16 ENCOUNTER — Encounter: Payer: Self-pay | Admitting: Cardiology

## 2016-12-16 NOTE — Assessment & Plan Note (Signed)
As is usually the case is comes into the clinic, her blood pressure is high, but she tells me home that they're relatively well-controlled. Since we are trying to avoid orthostatic symptoms, and to continue just to monitor. If she says that her blood pressure going up at home, we will probably increase amlodipine to 10mg .

## 2016-12-16 NOTE — Assessment & Plan Note (Addendum)
Not really having any notable symptoms to suggest diastolic heart failure. Blood pressure is low but high today, but she tells me home is better. In order to keep things simple, I will continue her current medications but asked that she monitor pressures at home to ensure the narrowing within the range of SBP 120-140 mmHg.  She is on a decent dose of amlodipine as well as enalapril and metoprolol. She is on a standing loop diuretic.

## 2016-12-16 NOTE — Assessment & Plan Note (Signed)
Well-controlled with support stockings. She takes her standing low-dose diuretic, but has not required any additional dosing.

## 2016-12-16 NOTE — Assessment & Plan Note (Addendum)
No anginal symptoms. Minimal CAD noted on cath in June 2013. She is on aspirin, statin, beta blocker, ACE inhibitor and calcium channel blocker.

## 2017-02-27 DIAGNOSIS — Z23 Encounter for immunization: Secondary | ICD-10-CM | POA: Diagnosis not present

## 2017-04-08 ENCOUNTER — Other Ambulatory Visit: Payer: Self-pay | Admitting: Cardiovascular Disease

## 2017-04-08 ENCOUNTER — Other Ambulatory Visit: Payer: Self-pay | Admitting: Cardiology

## 2017-04-09 NOTE — Telephone Encounter (Signed)
Rx(s) sent to pharmacy electronically.  

## 2017-04-24 ENCOUNTER — Telehealth: Payer: Self-pay | Admitting: Cardiology

## 2017-04-24 NOTE — Telephone Encounter (Signed)
Pt c/o medication issue: 1. Name of Medication: Chloride  2. How are you currently taking this medication (dosage and times per day)?  3. Are you having a reaction (difficulty breathing--STAT)?  4. What is your medication issue?

## 2017-04-24 NOTE — Telephone Encounter (Signed)
Patient called inquiring if her potassium chloride was on recall. Advised it was not. OK to take

## 2017-05-22 DIAGNOSIS — E042 Nontoxic multinodular goiter: Secondary | ICD-10-CM | POA: Diagnosis not present

## 2017-05-22 DIAGNOSIS — E063 Autoimmune thyroiditis: Secondary | ICD-10-CM | POA: Diagnosis not present

## 2017-05-22 DIAGNOSIS — E039 Hypothyroidism, unspecified: Secondary | ICD-10-CM | POA: Diagnosis not present

## 2017-05-22 DIAGNOSIS — E785 Hyperlipidemia, unspecified: Secondary | ICD-10-CM | POA: Diagnosis not present

## 2017-05-29 DIAGNOSIS — Z1389 Encounter for screening for other disorder: Secondary | ICD-10-CM | POA: Diagnosis not present

## 2017-05-29 DIAGNOSIS — R7309 Other abnormal glucose: Secondary | ICD-10-CM | POA: Diagnosis not present

## 2017-05-29 DIAGNOSIS — Z6832 Body mass index (BMI) 32.0-32.9, adult: Secondary | ICD-10-CM | POA: Diagnosis not present

## 2017-05-29 DIAGNOSIS — Z0001 Encounter for general adult medical examination with abnormal findings: Secondary | ICD-10-CM | POA: Diagnosis not present

## 2017-05-29 DIAGNOSIS — E6609 Other obesity due to excess calories: Secondary | ICD-10-CM | POA: Diagnosis not present

## 2017-05-29 DIAGNOSIS — M159 Polyosteoarthritis, unspecified: Secondary | ICD-10-CM | POA: Diagnosis not present

## 2017-05-29 DIAGNOSIS — E782 Mixed hyperlipidemia: Secondary | ICD-10-CM | POA: Diagnosis not present

## 2017-05-29 DIAGNOSIS — E669 Obesity, unspecified: Secondary | ICD-10-CM | POA: Diagnosis not present

## 2017-05-29 DIAGNOSIS — I10 Essential (primary) hypertension: Secondary | ICD-10-CM | POA: Diagnosis not present

## 2017-05-29 DIAGNOSIS — E063 Autoimmune thyroiditis: Secondary | ICD-10-CM | POA: Diagnosis not present

## 2017-05-30 DIAGNOSIS — E785 Hyperlipidemia, unspecified: Secondary | ICD-10-CM | POA: Diagnosis not present

## 2017-05-30 DIAGNOSIS — E063 Autoimmune thyroiditis: Secondary | ICD-10-CM | POA: Diagnosis not present

## 2017-05-30 DIAGNOSIS — I10 Essential (primary) hypertension: Secondary | ICD-10-CM | POA: Diagnosis not present

## 2017-05-30 DIAGNOSIS — E042 Nontoxic multinodular goiter: Secondary | ICD-10-CM | POA: Diagnosis not present

## 2017-05-30 DIAGNOSIS — E039 Hypothyroidism, unspecified: Secondary | ICD-10-CM | POA: Diagnosis not present

## 2017-06-13 NOTE — Progress Notes (Signed)
Angelica [name], [place] [address]   CLINIC:  Medical Oncology/Hematology  PCP:  [name], [place] [address] [phone]   REASON FOR VISIT:  Follow-up for Stage I left breast cancer; ER-/PR-/HER2-  CURRENT THERAPY: Surveillance   BRIEF ONCOLOGIC HISTORY:    Breast cancer ([place])   03/14/2011 Initial Diagnosis    Breast cancer ([place])      11/14/2016 Mammogram    BIRADS 1        HISTORY OF PRESENT ILLNESS:  (From Dr. Donald Pore note on 06/12/16)       INTERVAL HISTORY:  Angelica [name] 84 y.o. female returns for routine follow-up for history of triple negative left breast cancer.   Here today with her husband of 60 years.  Overall she tells me she has been feeling quite well.  Appetite 100%; energy level 75%.  She has chronic left side, left hip, lower back pain.  She endorses occasional dysphasia with liquids, shortness of breath at times, lower extremity swelling, diarrhea, rash to her left upper thigh, peripheral neuropathy to her toes and fingers at times, and easy bruising.  All of these complaints are chronic and largely stable.  Denies any new breast concerns. She has (L) chest wall "stinging" at times, which has been chronic since her surgery. Her last mammogram was done in 11/2016; she prefers to have her mammograms done at the [facility] in [place].  She has occasional (L) arm swelling; she is requesting new Rx for left arm lymphedema sleeve.   Last DEXA scan was in 01/2016. Her PCP manages her bone density.   Otherwise, she is largely without complaints today.     REVIEW OF SYSTEMS:  Review of Systems - Oncology, per HPI. 14-point ROS completed and negative except as stated above.    PAST MEDICAL/SURGICAL HISTORY:  Past Medical History:  Diagnosis Date  . Anemia   . Arthritis   . Breast cancer ([place]) 2011   left; no adjuvent therapy, Dr. Sonny Dandy  . Chronic bronchitis   . Colon polyps    Previous colonoscopy by Dr. Irving Shows  . Coronary artery disease, non-occlusive 2006   Roughly 40% RCA.; Negative Myoview in June 2013  . Diverticulosis of colon   . Gout   . Hemorrhoids   . HTN (hypertension)   . Hyperlipidemia   . Hypothyroid   . MI (myocardial infarction) ([place]) 1973   By report.   Past Surgical History:  Procedure Laterality Date  . ABDOMINAL HYSTERECTOMY     TAH  . CARDIAC CATHETERIZATION  2006   Roughly 40% RCA lesion, otherwise normal.  . CATARACT EXTRACTION W/PHACO  07/02/2011   Procedure: CATARACT EXTRACTION PHACO AND INTRAOCULAR LENS PLACEMENT (IOC);  Surgeon: Williams Che, MD;  Location: [facility];  Service: Ophthalmology;  Laterality: Left;  CDE:3.45  . CATARACT EXTRACTION W/PHACO  04/17/2012   Procedure: CATARACT EXTRACTION PHACO AND INTRAOCULAR LENS PLACEMENT (IOC);  Surgeon: Tonny Branch, MD;  Location: [facility];  Service: Ophthalmology;  Laterality: Right;  CDE:13.40  . CHOLECYSTECTOMY    . COLONOSCOPY      ?3 years ago. Per pt, diverticulosis, hemorrhoids  . COLONOSCOPY  04/06/2011   SLF: 1. Sessile polyp at the ileocecal valve 2. Diverticulosis, moderate 3. Internal hemorrhoids, Large-causing rectal bleeding associated with straining. tubular adenoma.  Marland Kitchen HEMORRHOID SURGERY    . LAPAROSCOPIC LYSIS OF ADHESIONS  2008   Dr. Zella Richer with repair of incisional hernia noted -   .  MASTECTOMY  2011   left  . NM MYOVIEW LTD  June 2013   No ischemia or infarction. Normal EF  . TRANSTHORACIC ECHOCARDIOGRAM  June 2013; 12/2013   a) Normal EF, no significant valve disease.; b) EF 50-55%, Gr1 DD, mild LVH.      SOCIAL HISTORY:  Social History   Socioeconomic History  . Marital status: Married    Spouse name: Not on file  . Number of children: 2  . Years of education: Not on file  . Highest education level: Not on file  Social Needs  . Financial resource strain: Not on file  . Food insecurity - worry: Not on file  . Food insecurity - inability: Not  on file  . Transportation needs - medical: Not on file  . Transportation needs - non-medical: Not on file  Occupational History  . Occupation: retired    Fish farm manager: RETIRED  Tobacco Use  . Smoking status: Never Smoker  . Smokeless tobacco: Never Used  Substance and Sexual Activity  . Alcohol use: No  . Drug use: No  . Sexual activity: Not Currently  Other Topics Concern  . Not on file  Social History Narrative   She is a very pleasant married African American woman with 2 children and 3 grandchildren. She does with her husband who is retired Social research officer, government. They now live on a large farm in the [place] area with a roughly 1/4 mile driveway. She usually walks a mile about twice a day, one by herself and 1 with her husband. She also enjoys doing gardening.    FAMILY HISTORY:  Family History  Problem Relation Age of [place]  . Prostate cancer Father   . Breast cancer Sister   . Breast cancer Other        paternal niece  . Colon cancer Neg Hx   . Anesthesia problems Neg Hx   . Hypotension Neg Hx   . Malignant hyperthermia Neg Hx   . Pseudochol deficiency Neg Hx   . Liver disease Neg Hx     CURRENT MEDICATIONS:  Outpatient Encounter Medications as of 06/18/2017  Medication Sig  . allopurinol (ZYLOPRIM) 300 MG tablet Take 300 mg by mouth daily.    Marland Kitchen alum & mag hydroxide-simeth (MAALOX/MYLANTA) 200-200-20 MG/5ML suspension Take 30 mLs by mouth every 6 (six) hours as needed for indigestion or heartburn (dyspepsia).  Marland Kitchen amLODipine (NORVASC) 5 MG tablet TAKE ONE TABLET BY MOUTH ONCE DAILY.  Marland Kitchen aspirin EC 81 MG tablet Take 81 mg by mouth daily.  Marland Kitchen atorvastatin (LIPITOR) 20 MG tablet TAKE (1) TABLET BY MOUTH AT BEDTIME FOR CHOLESTEROL.  Marland Kitchen enalapril (VASOTEC) 10 MG tablet Take 10 mg by mouth daily.  . furosemide (LASIX) 20 MG tablet Take 20 mg by mouth daily.   Marland Kitchen levothyroxine (SYNTHROID, LEVOTHROID) 50 MCG tablet Take 50 mcg by mouth daily.   . metoprolol succinate (TOPROL-XL) 50 MG 24 hr  tablet Take 50 mg by mouth daily. Take with or immediately following a meal.  . Multiple Vitamins-Minerals (MULTIVITAMINS THER. W/MINERALS) TABS Take 1 tablet by mouth daily.    . nitroGLYCERIN (NITROSTAT) 0.4 MG SL tablet Place 1 tablet (0.4 mg total) under the tongue every 5 (five) minutes as needed for chest pain.  . pantoprazole (PROTONIX) 40 MG tablet TAKE ONE TABLET BY MOUTH DAILY.  Marland Kitchen potassium chloride SA (K-DUR,KLOR-CON) 20 MEQ tablet Take 1 tablet (20 mEq total) by mouth daily.  Marland Kitchen Propylene Glycol (SYSTANE BALANCE) 0.6 % SOLN Place 1  drop into both eyes 2 (two) times daily.   No facility-administered encounter medications on file as of 06/18/2017.     ALLERGIES:  Allergies  Allergen Reactions  . Bee Venom Anaphylaxis  . Ciprofloxacin Nausea And Vomiting  . Clindamycin Hcl     Does not know  . Flagyl [Metronidazole Hcl] Nausea And Vomiting  . Shellfish-Derived Products     Rash developed so patient has steered clear of any since last year  . Sulfa Antibiotics Nausea Only     PHYSICAL EXAM:  ECOG Performance status: 1 - Symptomatic; remains largely independent   Vitals:   06/18/17 0952  BP: (!) 154/80  Pulse: 64  Resp: 16  Temp: 97.9 F (36.6 C)  SpO2: 98%   Filed Weights   06/18/17 0952  Weight: 163 lb 4.8 oz (74.1 kg)    Physical Exam  Constitutional: She is oriented to person, place, and time and well-developed, well-nourished, and in no distress.  HENT:  Head: Normocephalic.  Mouth/Throat: Oropharynx is clear and moist. No oropharyngeal exudate.  Eyes: Conjunctivae are normal. Pupils are equal, round, and reactive to light. No scleral icterus.  Neck: Normal range of motion. Neck supple.  Cardiovascular: Normal rate and regular rhythm.  Pulmonary/Chest: Effort normal and breath sounds normal. No respiratory distress.    Abdominal: Soft. Bowel sounds are normal. There is no tenderness.  Musculoskeletal: Normal range of motion. She exhibits edema (Trace  ankle edema).  Subtle (L) arm lymphedema  Lymphadenopathy:    She has no cervical adenopathy.       Right: No supraclavicular adenopathy present.       Left: No supraclavicular adenopathy present.  Neurological: She is alert and oriented to person, place, and time. No cranial nerve deficit. Gait normal.  Skin: Skin is warm and dry. No rash noted.  Psychiatric: Mood, memory, affect and judgment normal.  Nursing note and vitals reviewed.    LABORATORY DATA:  I have reviewed the labs as listed.  CBC    Component Value Date/Time   WBC 7.4 06/18/2017 0906   RBC 4.19 06/18/2017 0906   HGB 12.9 06/18/2017 0906   HCT 40.8 06/18/2017 0906   PLT 163 06/18/2017 0906   MCV 97.4 06/18/2017 0906   MCH 30.8 06/18/2017 0906   MCHC 31.6 06/18/2017 0906   RDW 14.0 06/18/2017 0906   LYMPHSABS 2.4 06/18/2017 0906   MONOABS 0.4 06/18/2017 0906   EOSABS 0.2 06/18/2017 0906   BASOSABS 0.1 06/18/2017 0906   CMP Latest Ref Rng & Units 06/18/2017 06/12/2016 11/02/2015  Glucose 65 - 99 mg/dL 103(H) 105(H) 103(H)  BUN 6 - 20 mg/dL _0 Creatinine 0.44 - 1.00 mg/dL 0.74 0.75 0.67  Sodium 135 - 145 mmol/L 136 136 137  Potassium 3.5 - 5.1 mmol/L 3.5 4.6 3.7  Chloride 101 - 111 mmol/L 97(L) 99(L) 99(L)  CO2 22 - 32 mmol/L _1 Calcium 8.9 - 10.3 mg/dL 9.0 9.5 8.9  Total Protein 6.5 - 8.1 g/dL 7.7 8.3(H) 8.2(H)  Total Bilirubin 0.3 - 1.2 mg/dL 1.6(H) 1.1 1.2  Alkaline Phos 38 - 126 U/L 57 66 71  AST 15 - 41 U/L _2 ALT 14 - 54 U/L 13(L) 14 18    PENDING LABS:    DIAGNOSTIC IMAGING:  Last mammogram: 11/14/16 - (done at [place] at [facility] in [place])     PATHOLOGY:     ASSESSMENT & PLAN:   Stage I left breast cancer;  ER-/PR-/HER2-:  -Diagnosed in 2011. Treated with (L) mastectomy. She received no post-operative therapy.  -Clinical breast exam performed today and negative. She is now 7+ years out from her initial diagnosis without evidence of recurrence; this  is very favorable.  -Last unilateral (R) breast screening mammogram completed in 11/2016 was normal; she will be due for annual imaging in 11/2017.  She prefers to continue to get her mammograms done in [place], which we are happy to help coordinate.  -Return to cancer center in 1 year for follow-up.   Bone health:  -Last DEXA scan on 01/27/16 showed osteopenia with T-score -1.5.  -Her PCP manages her bone density, which is appropriate since she does not require any anti-estrogen therapy for her h/o breast cancer that could weaken bone density. Defer any future DEXA imaging to her PCP as they deem clinically appropriate.  -Recommended calcium/vitamin D supplementation, and increase weight-bearing exercises as tolerated.    (L) arm lymphedema:  -Paper prescription for (L) arm lymphedema sleeve given to patient today. Encouraged continued use as needed/directed.      Dispo:  -Annual (R) breast screening mammogram due in 11/2017 in [place]; orders placed today.  -Return to cancer center in 1 year for follow-up.    All questions were answered to patient's stated satisfaction. Encouraged patient to call with any new concerns or questions before her next visit to the cancer center and we can certain see her sooner, if needed.      Orders placed this encounter:  Orders Placed This Encounter  Procedures  . MM DIAG BREAST TOMO UNI RIGHT      Mike Craze, NP [place] [phone]

## 2017-06-17 ENCOUNTER — Other Ambulatory Visit (HOSPITAL_COMMUNITY): Payer: Self-pay | Admitting: *Deleted

## 2017-06-17 DIAGNOSIS — C50912 Malignant neoplasm of unspecified site of left female breast: Secondary | ICD-10-CM

## 2017-06-18 ENCOUNTER — Ambulatory Visit (HOSPITAL_COMMUNITY): Payer: Medicare Other | Admitting: Hematology and Oncology

## 2017-06-18 ENCOUNTER — Other Ambulatory Visit (HOSPITAL_COMMUNITY): Payer: Medicare Other

## 2017-06-18 ENCOUNTER — Inpatient Hospital Stay (HOSPITAL_BASED_OUTPATIENT_CLINIC_OR_DEPARTMENT_OTHER): Payer: Medicare Other | Admitting: Adult Health

## 2017-06-18 ENCOUNTER — Ambulatory Visit (HOSPITAL_COMMUNITY): Payer: Medicare Other | Admitting: Adult Health

## 2017-06-18 ENCOUNTER — Encounter (HOSPITAL_COMMUNITY): Payer: Self-pay | Admitting: Adult Health

## 2017-06-18 ENCOUNTER — Inpatient Hospital Stay (HOSPITAL_COMMUNITY): Payer: Medicare Other | Attending: Oncology

## 2017-06-18 ENCOUNTER — Other Ambulatory Visit: Payer: Self-pay

## 2017-06-18 VITALS — BP 154/80 | HR 64 | Temp 97.9°F | Resp 16 | Ht 60.0 in | Wt 163.3 lb

## 2017-06-18 DIAGNOSIS — M545 Low back pain: Secondary | ICD-10-CM | POA: Insufficient documentation

## 2017-06-18 DIAGNOSIS — Z79899 Other long term (current) drug therapy: Secondary | ICD-10-CM | POA: Insufficient documentation

## 2017-06-18 DIAGNOSIS — R21 Rash and other nonspecific skin eruption: Secondary | ICD-10-CM | POA: Diagnosis not present

## 2017-06-18 DIAGNOSIS — I10 Essential (primary) hypertension: Secondary | ICD-10-CM | POA: Insufficient documentation

## 2017-06-18 DIAGNOSIS — R0602 Shortness of breath: Secondary | ICD-10-CM | POA: Diagnosis not present

## 2017-06-18 DIAGNOSIS — E785 Hyperlipidemia, unspecified: Secondary | ICD-10-CM | POA: Insufficient documentation

## 2017-06-18 DIAGNOSIS — G629 Polyneuropathy, unspecified: Secondary | ICD-10-CM

## 2017-06-18 DIAGNOSIS — C50912 Malignant neoplasm of unspecified site of left female breast: Secondary | ICD-10-CM

## 2017-06-18 DIAGNOSIS — Z853 Personal history of malignant neoplasm of breast: Secondary | ICD-10-CM

## 2017-06-18 DIAGNOSIS — I972 Postmastectomy lymphedema syndrome: Secondary | ICD-10-CM

## 2017-06-18 DIAGNOSIS — Z171 Estrogen receptor negative status [ER-]: Secondary | ICD-10-CM

## 2017-06-18 DIAGNOSIS — I2510 Atherosclerotic heart disease of native coronary artery without angina pectoris: Secondary | ICD-10-CM

## 2017-06-18 DIAGNOSIS — Z7982 Long term (current) use of aspirin: Secondary | ICD-10-CM

## 2017-06-18 DIAGNOSIS — E039 Hypothyroidism, unspecified: Secondary | ICD-10-CM | POA: Diagnosis not present

## 2017-06-18 DIAGNOSIS — Z881 Allergy status to other antibiotic agents status: Secondary | ICD-10-CM

## 2017-06-18 DIAGNOSIS — R1310 Dysphagia, unspecified: Secondary | ICD-10-CM | POA: Diagnosis not present

## 2017-06-18 DIAGNOSIS — M25552 Pain in left hip: Secondary | ICD-10-CM

## 2017-06-18 DIAGNOSIS — I252 Old myocardial infarction: Secondary | ICD-10-CM

## 2017-06-18 DIAGNOSIS — G8929 Other chronic pain: Secondary | ICD-10-CM

## 2017-06-18 DIAGNOSIS — Z9012 Acquired absence of left breast and nipple: Secondary | ICD-10-CM | POA: Diagnosis not present

## 2017-06-18 DIAGNOSIS — M1990 Unspecified osteoarthritis, unspecified site: Secondary | ICD-10-CM | POA: Insufficient documentation

## 2017-06-18 DIAGNOSIS — R600 Localized edema: Secondary | ICD-10-CM

## 2017-06-18 DIAGNOSIS — I890 Lymphedema, not elsewhere classified: Secondary | ICD-10-CM

## 2017-06-18 DIAGNOSIS — Z1231 Encounter for screening mammogram for malignant neoplasm of breast: Secondary | ICD-10-CM

## 2017-06-18 LAB — COMPREHENSIVE METABOLIC PANEL
ALT: 13 U/L — AB (ref 14–54)
AST: 19 U/L (ref 15–41)
Albumin: 4.1 g/dL (ref 3.5–5.0)
Alkaline Phosphatase: 57 U/L (ref 38–126)
Anion gap: 12 (ref 5–15)
BUN: 19 mg/dL (ref 6–20)
CHLORIDE: 97 mmol/L — AB (ref 101–111)
CO2: 27 mmol/L (ref 22–32)
CREATININE: 0.74 mg/dL (ref 0.44–1.00)
Calcium: 9 mg/dL (ref 8.9–10.3)
GFR calc Af Amer: >60 mL/min (ref >60)
GFR calc non Af Amer: >60 mL/min (ref >60)
Glucose, Bld: 103 mg/dL — ABNORMAL HIGH (ref 65–99)
Potassium: 3.5 mmol/L (ref 3.5–5.1)
SODIUM: 136 mmol/L (ref 135–145)
Total Bilirubin: 1.6 mg/dL — ABNORMAL HIGH (ref 0.3–1.2)
Total Protein: 7.7 g/dL (ref 6.5–8.1)

## 2017-06-18 LAB — CBC WITH DIFFERENTIAL/PLATELET
Basophils Absolute: 0.1 10*3/uL (ref 0.0–0.1)
Basophils Relative: 1 %
EOS ABS: 0.2 10*3/uL (ref 0.0–0.7)
EOS PCT: 3 %
HCT: 40.8 % (ref 36.0–46.0)
HEMOGLOBIN: 12.9 g/dL (ref 12.0–15.0)
LYMPHS ABS: 2.4 10*3/uL (ref 0.7–4.0)
LYMPHS PCT: 33 %
MCH: 30.8 pg (ref 26.0–34.0)
MCHC: 31.6 g/dL (ref 30.0–36.0)
MCV: 97.4 fL (ref 78.0–100.0)
MONOS PCT: 5 %
Monocytes Absolute: 0.4 10*3/uL (ref 0.1–1.0)
NEUTROS PCT: 58 %
Neutro Abs: 4.3 10*3/uL (ref 1.7–7.7)
Platelets: 163 10*3/uL (ref 150–400)
RBC: 4.19 MIL/uL (ref 3.87–5.11)
RDW: 14 % (ref 11.5–15.5)
WBC: 7.4 10*3/uL (ref 4.0–10.5)

## 2017-06-18 NOTE — Patient Instructions (Signed)
[facility] at [facility] Discharge Instructions  RECOMMENDATIONS MADE BY THE CONSULTANT AND ANY TEST RESULTS WILL BE SENT TO YOUR REFERRING PHYSICIAN.  You were seen today by Mike Craze NP. Your mammogram is due in June of this year. Return in December for labs and follow up.  Thank you for choosing [facility] at [facility] to provide your oncology and hematology care.  To afford each patient quality time with our provider, please arrive at least 15 minutes before your scheduled appointment time.    If you have a lab appointment with the [facility] please come in thru the  Main Entrance and check in at the main information desk  You need to re-schedule your appointment should you arrive 10 or more minutes late.  We strive to give you quality time with our providers, and arriving late affects you and other patients whose appointments are after yours.  Also, if you no show three or more times for appointments you may be dismissed from the clinic at the providers discretion.     Again, thank you for choosing [facility].  Our hope is that these requests will decrease the amount of time that you wait before being seen by our physicians.       _____________________________________________________________  Should you have questions after your visit to [facility], please contact our office at [phone] between the hours of 8:30 a.m. and 4:30 p.m.  Voicemails left after 4:30 p.m. will not be returned until the following business day.  For prescription refill requests, have your pharmacy contact our office.       Resources For Cancer Patients and their Caregivers ? American Cancer Society: Can assist with transportation, wigs, general needs, runs Look Good Feel Better.        561-001-7600 ? Cancer Care: Provides financial assistance, online support groups, medication/co-pay assistance.  1-800-813-HOPE  640-379-2293) ? [facility] Assists [name] cancer patients and their families through emotional , educational and financial support.  414-879-3207 ? [place] DSS Where to apply for food stamps, Medicaid and utility assistance. (386)871-8583 ? [facility]: Transportation to medical appointments. 220-609-7281 ? Social Security Administration: May apply for disability if have a Stage IV cancer. 2530178254 (702)239-4502 ? [employer], Disability and Transit Services: Assists with nutrition, care and transit needs. [place] Support Programs: @10RELATIVEDAYS @ > Cancer Support Group  2nd Tuesday of the month 1pm-2pm, Journey Room  > Creative Journey  3rd Tuesday of the month 1130am-1pm, Journey Room  > Look Good Feel Better  1st Wednesday of the month 10am-12 noon, Journey Room (Call [facility] to register [phone])

## 2017-06-23 ENCOUNTER — Encounter (HOSPITAL_COMMUNITY): Payer: Self-pay | Admitting: Adult Health

## 2017-08-05 ENCOUNTER — Other Ambulatory Visit: Payer: Self-pay | Admitting: Cardiology

## 2017-09-26 ENCOUNTER — Other Ambulatory Visit: Payer: Self-pay | Admitting: Cardiology

## 2017-09-26 NOTE — Telephone Encounter (Signed)
REFILL 

## 2017-10-24 ENCOUNTER — Other Ambulatory Visit: Payer: Self-pay | Admitting: Cardiology

## 2017-11-20 DIAGNOSIS — Z1231 Encounter for screening mammogram for malignant neoplasm of breast: Secondary | ICD-10-CM | POA: Diagnosis not present

## 2017-11-27 ENCOUNTER — Other Ambulatory Visit: Payer: Self-pay | Admitting: Cardiology

## 2017-11-27 NOTE — Telephone Encounter (Signed)
Rx request sent to pharmacy.  

## 2017-12-24 ENCOUNTER — Other Ambulatory Visit: Payer: Self-pay | Admitting: Cardiovascular Disease

## 2018-01-01 ENCOUNTER — Other Ambulatory Visit: Payer: Self-pay | Admitting: Cardiology

## 2018-01-01 NOTE — Telephone Encounter (Signed)
Rx request sent to pharmacy.  

## 2018-01-10 ENCOUNTER — Other Ambulatory Visit: Payer: Self-pay | Admitting: Cardiovascular Disease

## 2018-01-10 NOTE — Telephone Encounter (Signed)
Rx request sent to pharmacy.  

## 2018-02-20 DIAGNOSIS — Z23 Encounter for immunization: Secondary | ICD-10-CM | POA: Diagnosis not present

## 2018-02-27 DIAGNOSIS — E6609 Other obesity due to excess calories: Secondary | ICD-10-CM | POA: Diagnosis not present

## 2018-02-27 DIAGNOSIS — Z1389 Encounter for screening for other disorder: Secondary | ICD-10-CM | POA: Diagnosis not present

## 2018-02-27 DIAGNOSIS — M5412 Radiculopathy, cervical region: Secondary | ICD-10-CM | POA: Diagnosis not present

## 2018-02-27 DIAGNOSIS — Z6831 Body mass index (BMI) 31.0-31.9, adult: Secondary | ICD-10-CM | POA: Diagnosis not present

## 2018-02-27 DIAGNOSIS — M5030 Other cervical disc degeneration, unspecified cervical region: Secondary | ICD-10-CM | POA: Diagnosis not present

## 2018-03-12 ENCOUNTER — Encounter: Payer: Self-pay | Admitting: Cardiology

## 2018-03-12 ENCOUNTER — Ambulatory Visit (INDEPENDENT_AMBULATORY_CARE_PROVIDER_SITE_OTHER): Payer: Medicare Other | Admitting: Cardiology

## 2018-03-12 VITALS — BP 122/80 | HR 66 | Ht 60.0 in | Wt 161.4 lb

## 2018-03-12 DIAGNOSIS — E782 Mixed hyperlipidemia: Secondary | ICD-10-CM

## 2018-03-12 DIAGNOSIS — I110 Hypertensive heart disease with heart failure: Secondary | ICD-10-CM

## 2018-03-12 DIAGNOSIS — R600 Localized edema: Secondary | ICD-10-CM

## 2018-03-12 DIAGNOSIS — I10 Essential (primary) hypertension: Secondary | ICD-10-CM | POA: Diagnosis not present

## 2018-03-12 DIAGNOSIS — I5032 Chronic diastolic (congestive) heart failure: Secondary | ICD-10-CM

## 2018-03-12 DIAGNOSIS — I2510 Atherosclerotic heart disease of native coronary artery without angina pectoris: Secondary | ICD-10-CM | POA: Diagnosis not present

## 2018-03-12 NOTE — Patient Instructions (Signed)
NO MEDICATION CHANGES      Your physician wants you to follow-up in [place]. You will receive a reminder letter in the mail two months in advance. If you don't receive a letter, please call our office to schedule the follow-up appointment.    If you need a refill on your cardiac medications before your next appointment, please call your pharmacy.

## 2018-03-12 NOTE — Progress Notes (Signed)
PCP: [name], MD  Clinic Note: Chief Complaint  Patient presents with  . Shortness of Breath    when active  . Edema    legs swelling gets worse in evenning    HPI: ALEXSANDRIA [name] is a 84 y.o. female with a PMH below who presents today for delayed annual f/u with c/o SOB & edema.   She and her husband "[name]" were both former patients of Dr. Rollene Fare. She apparently had a cardiac arrest episode during partial thyroidectomy in the past, but has not had any further cardiac issues since.  CATH in 2006: minimal CAD; Nonischemic Myoview & relatively normal Echocardiogram in Aug 2015.    HENNESY SOBALVARRO was last seen in July 2018 --was still quite active.  Doing her gardening and full activity.  No major complaints.  Had an episode where she got very hot and dizzy with near syncope, but better once cool down.  Swelling controlled with low-dose diuretic and support stockings but she does not wear them in the summer times.  Has seasonal allergies.  Recent Hospitalizations: None  Studies Personally Reviewed - (if available, images/films reviewed: From Epic Chart or Care Everywhere)  None  Interval History:  Angelica [name] returns here today overall doing very well.  She is very active doing her gardening and other activity working in the yard.  She does lots of volunteer work with [employer] working with young children.  For exercise, she does laps up and down her hallway (trying to avoid the heat).  Her swelling is pretty well controlled but she has been wearing her support stockings because the heat.  She does not take any additional Lasix. She denies any chest tightness or pressure with rest or exertion.  She does occasionally have wheezing and coughing issues that are probably more related to her allergies.  No PND, orthopnea or edema. No palpitations, lightheadedness, dizziness, weakness or syncope/near syncope. No TIA/amaurosis fugax symptoms. No claudication.  ROS: A comprehensive was  performed. Review of Systems  Constitutional: Negative for malaise/fatigue.  HENT: Positive for congestion (Allergies). Negative for nosebleeds.   Respiratory: Positive for cough and wheezing.        Related to allergies  Gastrointestinal: Negative for blood in stool and melena.  Genitourinary: Negative for hematuria.  Musculoskeletal: Positive for joint pain (Expected arthritis pains).  Endo/Heme/Allergies: Positive for environmental allergies.  Psychiatric/Behavioral: Positive for memory loss (Is a little forgetful, but still quite aware.).  All other systems reviewed and are negative.  I have reviewed and (if needed) personally updated the patient's problem list, medications, allergies, past medical and surgical history, social and family history.   Past Medical History:  Diagnosis Date  . Anemia   . Arthritis   . Breast cancer ([place]) 2011   left; no adjuvent therapy, Dr. Sonny Dandy  . Chronic bronchitis   . Colon polyps    Previous colonoscopy by Dr. Irving Shows  . Coronary artery disease, non-occlusive 2006   Roughly 40% RCA.; Negative Myoview in June 2013  . Diverticulosis of colon   . Gout   . Hemorrhoids   . HTN (hypertension)   . Hyperlipidemia   . Hypothyroid   . MI (myocardial infarction) ([place]) 1973   By report.    Past Surgical History:  Procedure Laterality Date  . ABDOMINAL HYSTERECTOMY     TAH  . CARDIAC CATHETERIZATION  2006   Roughly 40% RCA lesion, otherwise normal.  . CATARACT EXTRACTION W/PHACO  07/02/2011   Procedure: CATARACT  EXTRACTION PHACO AND INTRAOCULAR LENS PLACEMENT (IOC);  Surgeon: Williams Che, MD;  Location: [facility];  Service: Ophthalmology;  Laterality: Left;  CDE:3.45  . CATARACT EXTRACTION W/PHACO  04/17/2012   Procedure: CATARACT EXTRACTION PHACO AND INTRAOCULAR LENS PLACEMENT (IOC);  Surgeon: Tonny Branch, MD;  Location: [facility];  Service: Ophthalmology;  Laterality: Right;  CDE:13.40  . CHOLECYSTECTOMY    . COLONOSCOPY      ?3 years ago.  Per pt, diverticulosis, hemorrhoids  . COLONOSCOPY  04/06/2011   SLF: 1. Sessile polyp at the ileocecal valve 2. Diverticulosis, moderate 3. Internal hemorrhoids, Large-causing rectal bleeding associated with straining. tubular adenoma.  Marland Kitchen HEMORRHOID SURGERY    . LAPAROSCOPIC LYSIS OF ADHESIONS  2008   Dr. Zella Richer with repair of incisional hernia noted -   . MASTECTOMY  2011   left  . NM MYOVIEW LTD  June 2013   No ischemia or infarction. Normal EF  . TRANSTHORACIC ECHOCARDIOGRAM  June 2013; 12/2013   a) Normal EF, no significant valve disease.; b) EF 50-55%, Gr1 DD, mild LVH.     Current Meds  Medication Sig  . allopurinol (ZYLOPRIM) 300 MG tablet Take 300 mg by mouth daily.    Marland Kitchen alum & mag hydroxide-simeth (MAALOX/MYLANTA) 200-200-20 MG/5ML suspension Take 30 mLs by mouth every 6 (six) hours as needed for indigestion or heartburn (dyspepsia).  Marland Kitchen amLODipine (NORVASC) 5 MG tablet TAKE (1) TABLET BY MOUTH ONCE DAILY.  Marland Kitchen aspirin EC 81 MG tablet Take 81 mg by mouth daily.  Marland Kitchen atorvastatin (LIPITOR) 20 MG tablet TAKE (1) TABLET BY MOUTH AT BEDTIME FOR CHOLESTEROL.  Marland Kitchen enalapril (VASOTEC) 10 MG tablet Take 10 mg by mouth daily.  . furosemide (LASIX) 20 MG tablet Take 20 mg by mouth daily.   Marland Kitchen levothyroxine (SYNTHROID, LEVOTHROID) 50 MCG tablet Take 50 mcg by mouth daily.   . metoprolol succinate (TOPROL-XL) 50 MG 24 hr tablet Take 50 mg by mouth daily. Take with or immediately following a meal.  . Multiple Vitamins-Minerals (MULTIVITAMINS THER. W/MINERALS) TABS Take 1 tablet by mouth daily.    Marland Kitchen NITROSTAT 0.4 MG SL tablet DISSOLVE 1 TABLET UNDER TONGUE EVERY 5 MINUTES AS NEEDED FOR CHEST PAIN.  Marland Kitchen pantoprazole (PROTONIX) 40 MG tablet TAKE ONE TABLET BY MOUTH DAILY.  Marland Kitchen potassium chloride SA (K-DUR,KLOR-CON) 20 MEQ tablet Take 1 tablet (20 mEq total) by mouth daily.  Marland Kitchen Propylene Glycol (SYSTANE BALANCE) 0.6 % SOLN Place 1 drop into both eyes 2 (two) times daily.    Allergies  Allergen  Reactions  . Bee Venom Anaphylaxis  . Ciprofloxacin Nausea And Vomiting  . Clindamycin Hcl     Does not know  . Flagyl [Metronidazole Hcl] Nausea And Vomiting  . Shellfish-Derived Products     Rash developed so patient has steered clear of any since last year  . Sulfa Antibiotics Nausea Only    Social History   Tobacco Use  . Smoking status: Never Smoker  . Smokeless tobacco: Never Used  Substance Use Topics  . Alcohol use: No  . Drug use: No   Social History   Social History Narrative   She is a very pleasant married African American woman with 2 children and 3 grandchildren. She does with her husband who is retired Social research officer, government. They now live on a large farm in the [place] area with a roughly 1/4 mile driveway. She usually walks a mile about twice a day, one by herself and 1 with her husband. She also enjoys  doing gardening.    family history includes Breast cancer in her other and sister; Prostate cancer in her father.  Wt Readings from Last 3 Encounters:  03/12/18 161 lb 6.4 oz (73.2 kg)  06/18/17 163 lb 4.8 oz (74.1 kg)  12/14/16 164 lb 6.4 oz (74.6 kg)    PHYSICAL EXAM BP 122/80   Pulse 66   Ht 5' (1.524 m)   Wt 161 lb 6.4 oz (73.2 kg)   BMI 31.52 kg/m  Physical Exam  Constitutional: She is oriented to person, place, and time. She appears well-developed and well-nourished.  Pleasant elderly woman.  No acute distress.  Well-groomed.  HENT:  Head: Normocephalic and atraumatic.  Neck: Normal range of motion. Neck supple. No hepatojugular reflux and no JVD present. Carotid bruit is not present.  Cardiovascular: Normal rate, regular rhythm, intact distal pulses and normal pulses.  No extrasystoles are present. PMI is not displaced. Exam reveals no gallop and no friction rub.  Murmur heard.  Medium-pitched harsh crescendo-decrescendo early systolic murmur is present with a grade of 1/6 at the upper right sternal border. Pulmonary/Chest: Effort normal and breath  sounds normal. No respiratory distress. She has no wheezes. She has no rales.  Abdominal: Soft. Bowel sounds are normal. She exhibits no distension. There is no tenderness. There is no rebound.  Musculoskeletal: Normal range of motion. She exhibits edema (Mild R>L LE edema.  Mostly in the ankles and feet).  Neurological: She is alert and oriented to person, place, and time.  Skin: Skin is warm and dry.  Psychiatric: She has a normal mood and affect. Her behavior is normal. Judgment and thought content normal.  Vitals reviewed.   Adult ECG Report  Rate: 66 ;  Rhythm: normal sinus rhythm and Poor R wave progression.  Otherwise normal axis, intervals and durations;   Narrative Interpretation: Essentially normal EKG   Other studies Reviewed: Additional studies/ records that were reviewed today include:  Recent Labs - from [facility] 05/29/2017:  TC 197, TG 168, HDL 75, LDL 88.  A1c 5.6.  BUN/creatinine 19/0.74.  TSH 0.655.   ASSESSMENT / PLAN: Problem List Items Addressed This Visit    Bilateral lower extremity edema - Primary (Chronic)    Support stockings seem to be helping, but she does not wear them in the summertime.  I talked about elevating her feet, especially when she is not using the stockings.      Coronary artery disease, non-occlusive (Chronic)    Doing well with no anginal symptoms.  Minimal cath years ago. Is on aspirin, beta-blocker and statin.  Also on ACE inhibitor and amlodipine.      Relevant Orders   EKG 12-Lead (Completed)   Essential hypertension (Chronic)    Excellent blood pressure control on current medications.  Is on stable dose of Toprol, enalapril and amlodipine.  No signs of orthostasis..      Hyperlipidemia (Chronic)    Labs are monitored by PCP.  Last LDL was 88.  Target should be closer to 70, but would not be overly aggressive in this 84 year old woman.  She has no myalgias on current dose of statin.  Would be reluctant to push further.       Hypertensive cardiovascular disease- mild LVH, grade 1 diastolic dysfunction (Chronic)    No heart failure symptoms of diastolic heart failure.  Her edema is probably more related to venous stasis. Continue to elevate feet and wear support stockings.  Has not used any additional dose of furosemide.  Relevant Orders   EKG 12-Lead (Completed)      Current medicines are reviewed at length with the patient today.  (+/- concerns) n/a The following changes have been made:  n/a  Patient Instructions  NO MEDICATION CHANGES      Your physician wants you to follow-up in [place]. You will receive a reminder letter in the mail two months in advance. If you don't receive a letter, please call our office to schedule the follow-up appointment.    If you need a refill on your cardiac medications before your next appointment, please call your pharmacy.     Studies Ordered:   Orders Placed This Encounter  Procedures  . EKG 12-Lead      Glenetta Hew, M.D., M.S. Interventional Cardiologist   Pager # [phone] Phone # [phone] [address]. [place], [place] [address]   Thank you for choosing [facility]!!

## 2018-03-13 ENCOUNTER — Other Ambulatory Visit (HOSPITAL_COMMUNITY): Payer: Self-pay | Admitting: Family Medicine

## 2018-03-13 DIAGNOSIS — E2839 Other primary ovarian failure: Secondary | ICD-10-CM

## 2018-03-15 ENCOUNTER — Encounter: Payer: Self-pay | Admitting: Cardiology

## 2018-03-15 NOTE — Assessment & Plan Note (Signed)
No heart failure symptoms of diastolic heart failure.  Her edema is probably more related to venous stasis. Continue to elevate feet and wear support stockings.  Has not used any additional dose of furosemide.

## 2018-03-15 NOTE — Assessment & Plan Note (Signed)
Excellent blood pressure control on current medications.  Is on stable dose of Toprol, enalapril and amlodipine.  No signs of orthostasis.Marland Kitchen

## 2018-03-15 NOTE — Assessment & Plan Note (Signed)
Support stockings seem to be helping, but she does not wear them in the summertime.  I talked about elevating her feet, especially when she is not using the stockings.

## 2018-03-15 NOTE — Assessment & Plan Note (Signed)
Labs are monitored by PCP.  Last LDL was 88.  Target should be closer to 70, but would not be overly aggressive in this 84 year old woman.  She has no myalgias on current dose of statin.  Would be reluctant to push further.

## 2018-03-15 NOTE — Assessment & Plan Note (Signed)
Doing well with no anginal symptoms.  Minimal cath years ago. Is on aspirin, beta-blocker and statin.  Also on ACE inhibitor and amlodipine.

## 2018-03-26 ENCOUNTER — Ambulatory Visit (HOSPITAL_COMMUNITY)
Admission: RE | Admit: 2018-03-26 | Discharge: 2018-03-26 | Disposition: A | Discharge status: Home / Self Care | Payer: Medicare Other | Source: Ambulatory Visit | Attending: Family Medicine | Admitting: Family Medicine

## 2018-03-26 DIAGNOSIS — M85852 Other specified disorders of bone density and structure, left thigh: Secondary | ICD-10-CM | POA: Diagnosis not present

## 2018-03-26 DIAGNOSIS — Z13820 Encounter for screening for osteoporosis: Secondary | ICD-10-CM | POA: Insufficient documentation

## 2018-03-26 DIAGNOSIS — Z780 Asymptomatic menopausal state: Secondary | ICD-10-CM | POA: Diagnosis not present

## 2018-03-26 DIAGNOSIS — M8580 Other specified disorders of bone density and structure, unspecified site: Secondary | ICD-10-CM | POA: Insufficient documentation

## 2018-03-26 DIAGNOSIS — E2839 Other primary ovarian failure: Secondary | ICD-10-CM | POA: Insufficient documentation

## 2018-04-02 ENCOUNTER — Encounter: Payer: Self-pay | Admitting: "Endocrinology

## 2018-04-02 ENCOUNTER — Ambulatory Visit (INDEPENDENT_AMBULATORY_CARE_PROVIDER_SITE_OTHER): Payer: Medicare Other | Admitting: "Endocrinology

## 2018-04-02 VITALS — BP 134/88 | HR 74 | Ht 60.0 in | Wt 163.0 lb

## 2018-04-02 DIAGNOSIS — E039 Hypothyroidism, unspecified: Secondary | ICD-10-CM | POA: Diagnosis not present

## 2018-04-02 DIAGNOSIS — I2510 Atherosclerotic heart disease of native coronary artery without angina pectoris: Secondary | ICD-10-CM

## 2018-04-02 NOTE — Progress Notes (Signed)
Endocrinology Consult Note                                            04/02/2018, 5:20 PM   Subjective:    Patient ID: Angelica [name], female    DOB: 12-05-1932, PCP [name], MD   Past Medical History:  Diagnosis Date  . Anemia   . Arthritis   . Breast cancer ([place]) 2011   left; no adjuvent therapy, Dr. Sonny Dandy  . Chronic bronchitis   . Colon polyps    Previous colonoscopy by Dr. Irving Shows  . Coronary artery disease, non-occlusive 2006   Roughly 40% RCA.; Negative Myoview in June 2013  . Diverticulosis of colon   . Gout   . Hemorrhoids   . HTN (hypertension)   . Hyperlipidemia   . Hypothyroid   . MI (myocardial infarction) ([place]) 1973   By report.   Past Surgical History:  Procedure Laterality Date  . ABDOMINAL HYSTERECTOMY     TAH  . CARDIAC CATHETERIZATION  2006   Roughly 40% RCA lesion, otherwise normal.  . CATARACT EXTRACTION W/PHACO  07/02/2011   Procedure: CATARACT EXTRACTION PHACO AND INTRAOCULAR LENS PLACEMENT (IOC);  Surgeon: Williams Che, MD;  Location: [facility];  Service: Ophthalmology;  Laterality: Left;  CDE:3.45  . CATARACT EXTRACTION W/PHACO  04/17/2012   Procedure: CATARACT EXTRACTION PHACO AND INTRAOCULAR LENS PLACEMENT (IOC);  Surgeon: Tonny Branch, MD;  Location: [facility];  Service: Ophthalmology;  Laterality: Right;  CDE:13.40  . CHOLECYSTECTOMY    . COLONOSCOPY      ?3 years ago. Per pt, diverticulosis, hemorrhoids  . COLONOSCOPY  04/06/2011   SLF: 1. Sessile polyp at the ileocecal valve 2. Diverticulosis, moderate 3. Internal hemorrhoids, Large-causing rectal bleeding associated with straining. tubular adenoma.  Marland Kitchen HEMORRHOID SURGERY    . LAPAROSCOPIC LYSIS OF ADHESIONS  2008   Dr. Zella Richer with repair of incisional hernia noted -   . MASTECTOMY  2011   left  . NM MYOVIEW LTD  June 2013   No ischemia or infarction. Normal EF  . TRANSTHORACIC ECHOCARDIOGRAM  June 2013; 12/2013   a) Normal EF, no significant valve disease.; b) EF  50-55%, Gr1 DD, mild LVH.    Social History   Socioeconomic History  . Marital status: Married    Spouse name: Not on file  . Number of children: 2  . Years of education: Not on file  . Highest education level: Not on file  Occupational History  . Occupation: retired    Fish farm manager: RETIRED  Social Needs  . Financial resource strain: Not on file  . Food insecurity:    Worry: Not on file    Inability: Not on file  . Transportation needs:    Medical: Not on file    Non-medical: Not on file  Tobacco Use  . Smoking status: Never Smoker  . Smokeless tobacco: Never Used  Substance and Sexual Activity  . Alcohol use: No  . Drug use: No  . Sexual activity: Not Currently  Lifestyle  . Physical activity:    Days per week: Not on file    Minutes per session: Not on file  . Stress: Not on file  Relationships  . Social connections:    Talks on phone: Not on file    Gets together: Not on file  Attends religious service: Not on file    Active member of club or organization: Not on file    Attends meetings of clubs or organizations: Not on file    Relationship status: Not on file  Other Topics Concern  . Not on file  Social History Narrative   She is a very pleasant married African American woman with 2 children and 3 grandchildren. She does with her husband who is retired Social research officer, government. They now live on a large farm in the [place] area with a roughly 1/4 mile driveway. She usually walks a mile about twice a day, one by herself and 1 with her husband. She also enjoys doing gardening.   Outpatient Encounter Medications as of 04/02/2018  Medication Sig  . allopurinol (ZYLOPRIM) 300 MG tablet Take 300 mg by mouth daily.    Marland Kitchen alum & mag hydroxide-simeth (MAALOX/MYLANTA) 200-200-20 MG/5ML suspension Take 30 mLs by mouth every 6 (six) hours as needed for indigestion or heartburn (dyspepsia).  Marland Kitchen amLODipine (NORVASC) 5 MG tablet TAKE (1) TABLET BY MOUTH ONCE DAILY.  Marland Kitchen aspirin EC 81 MG tablet  Take 81 mg by mouth daily.  Marland Kitchen atorvastatin (LIPITOR) 20 MG tablet TAKE (1) TABLET BY MOUTH AT BEDTIME FOR CHOLESTEROL.  Marland Kitchen enalapril (VASOTEC) 10 MG tablet Take 10 mg by mouth daily.  . furosemide (LASIX) 20 MG tablet Take 20 mg by mouth daily.   Marland Kitchen levothyroxine (SYNTHROID, LEVOTHROID) 50 MCG tablet Take 50 mcg by mouth daily.   . metoprolol succinate (TOPROL-XL) 50 MG 24 hr tablet Take 50 mg by mouth daily. Take with or immediately following a meal.  . Multiple Vitamins-Minerals (MULTIVITAMINS THER. W/MINERALS) TABS Take 1 tablet by mouth daily.    Marland Kitchen NITROSTAT 0.4 MG SL tablet DISSOLVE 1 TABLET UNDER TONGUE EVERY 5 MINUTES AS NEEDED FOR CHEST PAIN.  Marland Kitchen pantoprazole (PROTONIX) 40 MG tablet TAKE ONE TABLET BY MOUTH DAILY.  Marland Kitchen potassium chloride SA (K-DUR,KLOR-CON) 20 MEQ tablet Take 1 tablet (20 mEq total) by mouth daily.  Marland Kitchen Propylene Glycol (SYSTANE BALANCE) 0.6 % SOLN Place 1 drop into both eyes 2 (two) times daily.   No facility-administered encounter medications on file as of 04/02/2018.    ALLERGIES: Allergies  Allergen Reactions  . Bee Venom Anaphylaxis  . Ciprofloxacin Nausea And Vomiting  . Clindamycin Hcl     Does not know  . Flagyl [Metronidazole Hcl] Nausea And Vomiting  . Shellfish-Derived Products     Rash developed so patient has steered clear of any since last year  . Sulfa Antibiotics Nausea Only    VACCINATION STATUS:  There is no immunization history on file for this patient.  HPI Angelica [name] is 84 y.o. female who presents today with a medical history as above. she is being seen in consultation for hypothyroidism requested by [name], MD.   Her history starts in early 41s when she underwent unsuccessful partial thyroidectomy for what appears to be hyperthyroidism which was followed by I-131 thyroid ablation.  She has taken various forms of thyroid hormone over the years.  She is currently on levothyroxine 50 mcg p.o. the morning .  She reports reasonable  compliance to this medication.  -She does not have recent thyroid function test to review.  She has had several thyroid ultrasound studies with Dr. Ronnald Collum, last one in December 2019.  This imaging study is not available for review. -She has no immediate concerns nor complaints today.  She denies heat intolerance, tremors, palpitations.  She has  a steady weight.  Review of Systems  Constitutional: no weight gain/loss, no fatigue, no subjective hyperthermia, no subjective hypothermia Eyes: no blurry vision, no xerophthalmia ENT: no sore throat, no nodules palpated in throat, no dysphagia/odynophagia, no hoarseness Cardiovascular: no Chest Pain, no Shortness of Breath, no palpitations, no leg swelling Respiratory: no cough, no SOB Gastrointestinal: no Nausea/Vomiting/Diarhhea Musculoskeletal: no muscle/joint aches Skin: no rashes Neurological: no tremors, no numbness, no tingling, no dizziness Psychiatric: no depression, no anxiety  Objective:    BP 134/88   Pulse 74   Ht 5' (1.524 m)   Wt 163 lb (73.9 kg)   BMI 31.83 kg/m   Wt Readings from Last 3 Encounters:  04/02/18 163 lb (73.9 kg)  03/12/18 161 lb 6.4 oz (73.2 kg)  06/18/17 163 lb 4.8 oz (74.1 kg)    Physical Exam  Constitutional: + Obese for height, not in acute distress, normal state of mind Eyes: PERRLA, EOMI, no exophthalmos ENT: moist mucous membranes, + anterior lower neck scar, no thyromegaly, no cervical lymphadenopathy Cardiovascular: normal precordial activity, Regular Rate and Rhythm, no Murmur/Rubs/Gallops Respiratory:  adequate breathing efforts, no gross chest deformity, Clear to auscultation bilaterally Gastrointestinal: abdomen soft, Non -tender, No distension, Bowel Sounds present Musculoskeletal: no gross deformities, strength intact in all four extremities Skin: moist, warm, no rashes Neurological: no tremor with outstretched hands, Deep tendon reflexes normal in all four extremities.  CMP ( most  recent) CMP     Component Value Date/Time   NA 136 06/18/2017 0906   K 3.5 06/18/2017 0906   CL 97 (L) 06/18/2017 0906   CO2 27 06/18/2017 0906   GLUCOSE 103 (H) 06/18/2017 0906   BUN 19 06/18/2017 0906   CREATININE 0.74 06/18/2017 0906   CREATININE 0.76 03/15/2011 1413   CALCIUM 9.0 06/18/2017 0906   PROT 7.7 06/18/2017 0906   PROT 7.4 04/06/2015 1058   ALBUMIN 4.1 06/18/2017 0906   ALBUMIN 4.4 04/06/2015 1058   AST 19 06/18/2017 0906   ALT 13 (L) 06/18/2017 0906   ALKPHOS 57 06/18/2017 0906   BILITOT 1.6 (H) 06/18/2017 0906   BILITOT 1.4 (H) 04/06/2015 1058   GFRNONAA >60 06/18/2017 0906   GFRAA >60 06/18/2017 0906        Assessment & Plan:   1. Hypothyroidism, unspecified type  - Henreitta Cea Collyer  is being seen at a kind request of [name], MD. -Her thyroid records are not available to review today.  She appears clinically euthyroid.   -She does not have recent thyroid function test to help adjust her treatment.    -She will be sent to lab for new set of thyroid function tests.   -We will also attempt to obtain a copy of her most recent thyroid imaging from Dr. De Hollingshead office.    I advised her to continue levothyroxine 50 mcg p.o. every morning.  - We discussed about correct intake of levothyroxine, at fasting, with water, separated by at least 30 minutes from breakfast, and separated by more than 4 hours from calcium, iron, multivitamins, acid reflux medications (PPIs). -Patient is made aware of the fact that thyroid hormone replacement is needed for life, dose to be adjusted by periodic monitoring of thyroid function tests.    - I did not initiate any new prescriptions today. - I advised her  to maintain close follow up with [name], MD for primary care needs.   Henreitta Cea Defilippo participated in the discussions, expressed understanding, and voiced agreement with the above  plans.  All questions were answered to her satisfaction. she is encouraged to  contact clinic should she have any questions or concerns prior to her return visit.  Follow up plan: Return in about 2 weeks (around 04/16/2018), or get her to sign a medical release so we get her ultrasound from Dr. Ronnald Collum, for Labs Today- Non-Fasting Ok.   Glade Lloyd, MD [facility] [address] [place], [place] [address] Phone: [phone]  Fax: [phone]     04/02/2018, 5:20 PM  This note was partially dictated with voice recognition software. Similar sounding words can be transcribed inadequately or may not  be corrected upon review.

## 2018-04-03 DIAGNOSIS — E039 Hypothyroidism, unspecified: Secondary | ICD-10-CM | POA: Diagnosis not present

## 2018-04-04 LAB — T4, FREE: FREE T4: 1.1 ng/dL (ref 0.8–1.8)

## 2018-04-04 LAB — THYROGLOBULIN ANTIBODY: Thyroglobulin Ab: <1 [IU]/mL

## 2018-04-04 LAB — THYROID PEROXIDASE ANTIBODY

## 2018-04-04 LAB — TSH: TSH: 0.82 mIU/L (ref 0.40–4.50)

## 2018-04-17 ENCOUNTER — Encounter: Payer: Self-pay | Admitting: "Endocrinology

## 2018-04-17 ENCOUNTER — Ambulatory Visit (INDEPENDENT_AMBULATORY_CARE_PROVIDER_SITE_OTHER): Payer: Medicare Other | Admitting: "Endocrinology

## 2018-04-17 VITALS — BP 137/78 | HR 68 | Ht 60.0 in | Wt 162.0 lb

## 2018-04-17 DIAGNOSIS — I2510 Atherosclerotic heart disease of native coronary artery without angina pectoris: Secondary | ICD-10-CM | POA: Diagnosis not present

## 2018-04-17 DIAGNOSIS — E039 Hypothyroidism, unspecified: Secondary | ICD-10-CM

## 2018-04-17 MED ORDER — LEVOTHYROXINE SODIUM 50 MCG PO TABS: 50.0000 ug | ORAL_TABLET | Freq: Every day | ORAL | 3 refills | Status: DC

## 2018-04-17 NOTE — Progress Notes (Signed)
Endocrinology follow-up  Note                                            04/17/2018, 5:31 PM   Subjective:    Patient ID: Angelica [name], female    DOB: 1932/07/29, PCP [name], MD   Past Medical History:  Diagnosis Date  . Anemia   . Arthritis   . Breast cancer ([place]) 2011   left; no adjuvent therapy, Dr. Sonny Dandy  . Chronic bronchitis   . Colon polyps    Previous colonoscopy by Dr. Irving Shows  . Coronary artery disease, non-occlusive 2006   Roughly 40% RCA.; Negative Myoview in June 2013  . Diverticulosis of colon   . Gout   . Hemorrhoids   . HTN (hypertension)   . Hyperlipidemia   . Hypothyroid   . MI (myocardial infarction) ([place]) 1973   By report.   Past Surgical History:  Procedure Laterality Date  . ABDOMINAL HYSTERECTOMY     TAH  . CARDIAC CATHETERIZATION  2006   Roughly 40% RCA lesion, otherwise normal.  . CATARACT EXTRACTION W/PHACO  07/02/2011   Procedure: CATARACT EXTRACTION PHACO AND INTRAOCULAR LENS PLACEMENT (IOC);  Surgeon: Williams Che, MD;  Location: [facility];  Service: Ophthalmology;  Laterality: Left;  CDE:3.45  . CATARACT EXTRACTION W/PHACO  04/17/2012   Procedure: CATARACT EXTRACTION PHACO AND INTRAOCULAR LENS PLACEMENT (IOC);  Surgeon: Tonny Branch, MD;  Location: [facility];  Service: Ophthalmology;  Laterality: Right;  CDE:13.40  . CHOLECYSTECTOMY    . COLONOSCOPY      ?3 years ago. Per pt, diverticulosis, hemorrhoids  . COLONOSCOPY  04/06/2011   SLF: 1. Sessile polyp at the ileocecal valve 2. Diverticulosis, moderate 3. Internal hemorrhoids, Large-causing rectal bleeding associated with straining. tubular adenoma.  Marland Kitchen HEMORRHOID SURGERY    . LAPAROSCOPIC LYSIS OF ADHESIONS  2008   Dr. Zella Richer with repair of incisional hernia noted -   . MASTECTOMY  2011   left  . NM MYOVIEW LTD  June 2013   No ischemia or infarction. Normal EF  . TRANSTHORACIC ECHOCARDIOGRAM  June 2013; 12/2013   a) Normal EF, no significant valve disease.; b) EF  50-55%, Gr1 DD, mild LVH.    Social History   Socioeconomic History  . Marital status: Married    Spouse name: Not on file  . Number of children: 2  . Years of education: Not on file  . Highest education level: Not on file  Occupational History  . Occupation: retired    Fish farm manager: RETIRED  Social Needs  . Financial resource strain: Not on file  . Food insecurity:    Worry: Not on file    Inability: Not on file  . Transportation needs:    Medical: Not on file    Non-medical: Not on file  Tobacco Use  . Smoking status: Never Smoker  . Smokeless tobacco: Never Used  Substance and Sexual Activity  . Alcohol use: No  . Drug use: No  . Sexual activity: Not Currently  Lifestyle  . Physical activity:    Days per week: Not on file    Minutes per session: Not on file  . Stress: Not on file  Relationships  . Social connections:    Talks on phone: Not on file    Gets together: Not on file  Attends religious service: Not on file    Active member of club or organization: Not on file    Attends meetings of clubs or organizations: Not on file    Relationship status: Not on file  Other Topics Concern  . Not on file  Social History Narrative   She is a very pleasant married African American woman with 2 children and 3 grandchildren. She does with her husband who is retired Social research officer, government. They now live on a large farm in the [place] area with a roughly 1/4 mile driveway. She usually walks a mile about twice a day, one by herself and 1 with her husband. She also enjoys doing gardening.   Outpatient Encounter Medications as of 04/17/2018  Medication Sig  . allopurinol (ZYLOPRIM) 300 MG tablet Take 300 mg by mouth daily.    Marland Kitchen alum & mag hydroxide-simeth (MAALOX/MYLANTA) 200-200-20 MG/5ML suspension Take 30 mLs by mouth every 6 (six) hours as needed for indigestion or heartburn (dyspepsia).  Marland Kitchen amLODipine (NORVASC) 5 MG tablet TAKE (1) TABLET BY MOUTH ONCE DAILY.  Marland Kitchen aspirin EC 81 MG tablet  Take 81 mg by mouth daily.  Marland Kitchen atorvastatin (LIPITOR) 20 MG tablet TAKE (1) TABLET BY MOUTH AT BEDTIME FOR CHOLESTEROL.  Marland Kitchen enalapril (VASOTEC) 10 MG tablet Take 10 mg by mouth daily.  . furosemide (LASIX) 20 MG tablet Take 20 mg by mouth daily.   Marland Kitchen levothyroxine (SYNTHROID, LEVOTHROID) 50 MCG tablet Take 1 tablet (50 mcg total) by mouth daily.  . metoprolol succinate (TOPROL-XL) 50 MG 24 hr tablet Take 50 mg by mouth daily. Take with or immediately following a meal.  . Multiple Vitamins-Minerals (MULTIVITAMINS THER. W/MINERALS) TABS Take 1 tablet by mouth daily.    Marland Kitchen NITROSTAT 0.4 MG SL tablet DISSOLVE 1 TABLET UNDER TONGUE EVERY 5 MINUTES AS NEEDED FOR CHEST PAIN.  Marland Kitchen pantoprazole (PROTONIX) 40 MG tablet TAKE ONE TABLET BY MOUTH DAILY.  Marland Kitchen potassium chloride SA (K-DUR,KLOR-CON) 20 MEQ tablet Take 1 tablet (20 mEq total) by mouth daily.  Marland Kitchen Propylene Glycol (SYSTANE BALANCE) 0.6 % SOLN Place 1 drop into both eyes 2 (two) times daily.  . [DISCONTINUED] levothyroxine (SYNTHROID, LEVOTHROID) 50 MCG tablet Take 50 mcg by mouth daily.    No facility-administered encounter medications on file as of 04/17/2018.    ALLERGIES: Allergies  Allergen Reactions  . Bee Venom Anaphylaxis  . Ciprofloxacin Nausea And Vomiting  . Clindamycin Hcl     Does not know  . Flagyl [Metronidazole Hcl] Nausea And Vomiting  . Shellfish-Derived Products     Rash developed so patient has steered clear of any since last year  . Sulfa Antibiotics Nausea Only    VACCINATION STATUS:  There is no immunization history on file for this patient.  HPI Angelica [name] is 84 y.o. female who presents today with a medical history as above. she is returning with repeat thyroid function tests after she was seen in consultation for hypothyroidism.    PMD:  [name], MD.   Her history starts in early 72s when she underwent unsuccessful partial thyroidectomy for what appears to be hyperthyroidism which was followed by I-131  thyroid ablation.  She has taken various forms of thyroid hormone over the years.  She is currently on levothyroxine 50 mcg p.o. the morning .  She reports reasonable compliance to this medication.  -Her recent thyroid function tests are consistent with appropriate replacement.  She has no new complaints today.   She has had several thyroid  ultrasound studies with Dr. Ronnald Collum, last one in December 2019.  This imaging study is not available for review. -She denies heat intolerance, tremors, palpitations.  She has a steady weight, denies dysphagia, odynophagia, shortness of breath.  Review of Systems  Constitutional:+ steady weight , no fatigue, no subjective hyperthermia, no subjective hypothermia Eyes: no blurry vision, no xerophthalmia ENT: no sore throat, no nodules palpated in throat, no dysphagia/odynophagia, no hoarseness Cardiovascular: no Chest Pain, no Shortness of Breath, no palpitations, no leg swelling Musculoskeletal: no muscle/joint aches Skin: no rashes Neurological: no tremors, no numbness, no tingling, no dizziness Psychiatric: no depression, no anxiety  Objective:    BP 137/78   Pulse 68   Ht 5' (1.524 m)   Wt 162 lb (73.5 kg)   BMI 31.64 kg/m   Wt Readings from Last 3 Encounters:  04/17/18 162 lb (73.5 kg)  04/02/18 163 lb (73.9 kg)  03/12/18 161 lb 6.4 oz (73.2 kg)    Physical Exam  Constitutional: + Obese for height, not in acute distress, normal state of mind.   Eyes: PERRLA, EOMI, no exophthalmos ENT: moist mucous membranes, + anterior lower neck scar from prior partial thyroidectomy,  no thyromegaly, no cervical lymphadenopathy  Musculoskeletal: no gross deformities, strength intact in all four extremities Skin: moist, warm, no rashes Neurological: no tremor with outstretched hands, Deep tendon reflexes normal in all four extremities.  CMP ( most recent) CMP     Component Value Date/Time   NA 136 06/18/2017 0906   K 3.5 06/18/2017 0906   CL 97 (L)  06/18/2017 0906   CO2 27 06/18/2017 0906   GLUCOSE 103 (H) 06/18/2017 0906   BUN 19 06/18/2017 0906   CREATININE 0.74 06/18/2017 0906   CREATININE 0.76 03/15/2011 1413   CALCIUM 9.0 06/18/2017 0906   PROT 7.7 06/18/2017 0906   PROT 7.4 04/06/2015 1058   ALBUMIN 4.1 06/18/2017 0906   ALBUMIN 4.4 04/06/2015 1058   AST 19 06/18/2017 0906   ALT 13 (L) 06/18/2017 0906   ALKPHOS 57 06/18/2017 0906   BILITOT 1.6 (H) 06/18/2017 0906   BILITOT 1.4 (H) 04/06/2015 1058   GFRNONAA >60 06/18/2017 0906   GFRAA >60 06/18/2017 0906     Recent Results (from the past 2160 hour(s))  TSH     Status: None   Collection Time: 04/03/18  9:56 AM  Result Value Ref Range   TSH 0.82 0.40 - 4.50 mIU/L  T4, free     Status: None   Collection Time: 04/03/18  9:56 AM  Result Value Ref Range   Free T4 1.1 0.8 - 1.8 ng/dL  Thyroid peroxidase antibody     Status: None   Collection Time: 04/03/18  9:56 AM  Result Value Ref Range   Thyroperoxidase Ab SerPl-aCnc <1 <9 IU/mL  Thyroglobulin antibody     Status: None   Collection Time: 04/03/18  9:56 AM  Result Value Ref Range   Thyroglobulin Ab <1 < or = 1 IU/mL     Assessment & Plan:   1. Hypothyroidism-due to partial thyroidectomy in the remote past   -We will  attempt to obtain a copy of her most recent thyroid imaging from Dr. De Hollingshead office.   -Her thyroid function tests are consistent with appropriate replacement. -She is advised to continue levothyroxine 50 mcg p.o. every morning.   - We discussed about correct intake of levothyroxine, at fasting, with water, separated by at least 30 minutes from breakfast, and separated by more than 4  hours from calcium, iron, multivitamins, acid reflux medications (PPIs). -Patient is made aware of the fact that thyroid hormone replacement is needed for life, dose to be adjusted by periodic monitoring of thyroid function tests.   - I advised her  to maintain close follow up with [name], MD for  primary care needs.   Henreitta Cea Cutrona participated in the discussions, expressed understanding, and voiced agreement with the above plans.  All questions were answered to her satisfaction. she is encouraged to contact clinic should she have any questions or concerns prior to her return visit.  Follow up plan: Return in about 6 months (around 10/16/2018) for Follow up with Pre-visit Labs.   Glade Lloyd, MD [facility] [address] [place], [place] [address] Phone: [phone]  Fax: [phone]     04/17/2018, 5:31 PM  This note was partially dictated with voice recognition software. Similar sounding words can be transcribed inadequately or may not  be corrected upon review.

## 2018-05-07 ENCOUNTER — Other Ambulatory Visit (HOSPITAL_COMMUNITY): Payer: Self-pay

## 2018-05-07 DIAGNOSIS — C50912 Malignant neoplasm of unspecified site of left female breast: Secondary | ICD-10-CM

## 2018-05-07 DIAGNOSIS — Z171 Estrogen receptor negative status [ER-]: Principal | ICD-10-CM

## 2018-05-12 ENCOUNTER — Inpatient Hospital Stay (HOSPITAL_COMMUNITY): Payer: Medicare Other | Attending: Hematology

## 2018-05-12 DIAGNOSIS — I252 Old myocardial infarction: Secondary | ICD-10-CM | POA: Diagnosis not present

## 2018-05-12 DIAGNOSIS — Z7982 Long term (current) use of aspirin: Secondary | ICD-10-CM | POA: Diagnosis not present

## 2018-05-12 DIAGNOSIS — C50912 Malignant neoplasm of unspecified site of left female breast: Secondary | ICD-10-CM

## 2018-05-12 DIAGNOSIS — Z853 Personal history of malignant neoplasm of breast: Secondary | ICD-10-CM | POA: Insufficient documentation

## 2018-05-12 DIAGNOSIS — Z79899 Other long term (current) drug therapy: Secondary | ICD-10-CM | POA: Diagnosis not present

## 2018-05-12 DIAGNOSIS — I10 Essential (primary) hypertension: Secondary | ICD-10-CM | POA: Insufficient documentation

## 2018-05-12 DIAGNOSIS — Z9012 Acquired absence of left breast and nipple: Secondary | ICD-10-CM | POA: Diagnosis not present

## 2018-05-12 DIAGNOSIS — E039 Hypothyroidism, unspecified: Secondary | ICD-10-CM | POA: Diagnosis not present

## 2018-05-12 DIAGNOSIS — E785 Hyperlipidemia, unspecified: Secondary | ICD-10-CM | POA: Insufficient documentation

## 2018-05-12 DIAGNOSIS — M8580 Other specified disorders of bone density and structure, unspecified site: Secondary | ICD-10-CM | POA: Diagnosis not present

## 2018-05-12 DIAGNOSIS — Z803 Family history of malignant neoplasm of breast: Secondary | ICD-10-CM | POA: Diagnosis not present

## 2018-05-12 DIAGNOSIS — Z171 Estrogen receptor negative status [ER-]: Secondary | ICD-10-CM | POA: Insufficient documentation

## 2018-05-12 DIAGNOSIS — Z8042 Family history of malignant neoplasm of prostate: Secondary | ICD-10-CM | POA: Insufficient documentation

## 2018-05-12 LAB — CBC WITH DIFFERENTIAL/PLATELET
Abs Immature Granulocytes: 0.01 10*3/uL (ref 0.00–0.07)
BASOS ABS: 0.1 10*3/uL (ref 0.0–0.1)
Basophils Relative: 1 %
EOS ABS: 0.3 10*3/uL (ref 0.0–0.5)
EOS PCT: 4 %
HCT: 40.7 % (ref 36.0–46.0)
Hemoglobin: 12.7 g/dL (ref 12.0–15.0)
Immature Granulocytes: 0 %
LYMPHS PCT: 34 %
Lymphs Abs: 2.5 10*3/uL (ref 0.7–4.0)
MCH: 30.9 pg (ref 26.0–34.0)
MCHC: 31.2 g/dL (ref 30.0–36.0)
MCV: 99 fL (ref 80.0–100.0)
Monocytes Absolute: 0.4 10*3/uL (ref 0.1–1.0)
Monocytes Relative: 6 %
NRBC: 0 % (ref 0.0–0.2)
Neutro Abs: 4 10*3/uL (ref 1.7–7.7)
Neutrophils Relative %: 55 %
PLATELETS: 174 10*3/uL (ref 150–400)
RBC: 4.11 MIL/uL (ref 3.87–5.11)
RDW: 14 % (ref 11.5–15.5)
WBC: 7.3 10*3/uL (ref 4.0–10.5)

## 2018-05-12 LAB — COMPREHENSIVE METABOLIC PANEL
ALT: 17 U/L (ref 0–44)
AST: 22 U/L (ref 15–41)
Albumin: 4.4 g/dL (ref 3.5–5.0)
Alkaline Phosphatase: 53 U/L (ref 38–126)
Anion gap: 10 (ref 5–15)
BUN: 17 mg/dL (ref 8–23)
CHLORIDE: 101 mmol/L (ref 98–111)
CO2: 30 mmol/L (ref 22–32)
CREATININE: 0.72 mg/dL (ref 0.44–1.00)
Calcium: 9.2 mg/dL (ref 8.9–10.3)
Glucose, Bld: 99 mg/dL (ref 70–99)
Potassium: 4 mmol/L (ref 3.5–5.1)
Sodium: 141 mmol/L (ref 135–145)
Total Bilirubin: 1.7 mg/dL — ABNORMAL HIGH (ref 0.3–1.2)
Total Protein: 8.3 g/dL — ABNORMAL HIGH (ref 6.5–8.1)

## 2018-05-14 ENCOUNTER — Telehealth: Payer: Self-pay | Admitting: "Endocrinology

## 2018-05-14 NOTE — Telephone Encounter (Signed)
OPENED IN ERROR

## 2018-05-19 ENCOUNTER — Encounter (HOSPITAL_COMMUNITY): Payer: Self-pay | Admitting: Hematology

## 2018-05-19 ENCOUNTER — Other Ambulatory Visit: Payer: Self-pay

## 2018-05-19 ENCOUNTER — Ambulatory Visit (HOSPITAL_COMMUNITY): Payer: Medicare Other

## 2018-05-19 ENCOUNTER — Ambulatory Visit (HOSPITAL_COMMUNITY): Payer: Medicare Other | Admitting: Hematology

## 2018-05-19 ENCOUNTER — Inpatient Hospital Stay (HOSPITAL_BASED_OUTPATIENT_CLINIC_OR_DEPARTMENT_OTHER): Payer: Medicare Other | Admitting: Hematology

## 2018-05-19 VITALS — BP 135/71 | HR 63 | Temp 98.2°F | Resp 16 | Wt 161.4 lb

## 2018-05-19 DIAGNOSIS — E039 Hypothyroidism, unspecified: Secondary | ICD-10-CM | POA: Diagnosis not present

## 2018-05-19 DIAGNOSIS — I252 Old myocardial infarction: Secondary | ICD-10-CM | POA: Diagnosis not present

## 2018-05-19 DIAGNOSIS — Z853 Personal history of malignant neoplasm of breast: Secondary | ICD-10-CM | POA: Diagnosis not present

## 2018-05-19 DIAGNOSIS — M8580 Other specified disorders of bone density and structure, unspecified site: Secondary | ICD-10-CM

## 2018-05-19 DIAGNOSIS — Z79899 Other long term (current) drug therapy: Secondary | ICD-10-CM

## 2018-05-19 DIAGNOSIS — Z171 Estrogen receptor negative status [ER-]: Secondary | ICD-10-CM | POA: Diagnosis not present

## 2018-05-19 DIAGNOSIS — Z9012 Acquired absence of left breast and nipple: Secondary | ICD-10-CM

## 2018-05-19 DIAGNOSIS — Z7982 Long term (current) use of aspirin: Secondary | ICD-10-CM

## 2018-05-19 DIAGNOSIS — E785 Hyperlipidemia, unspecified: Secondary | ICD-10-CM

## 2018-05-19 DIAGNOSIS — C50912 Malignant neoplasm of unspecified site of left female breast: Secondary | ICD-10-CM

## 2018-05-19 DIAGNOSIS — Z8042 Family history of malignant neoplasm of prostate: Secondary | ICD-10-CM

## 2018-05-19 DIAGNOSIS — Z803 Family history of malignant neoplasm of breast: Secondary | ICD-10-CM | POA: Diagnosis not present

## 2018-05-19 DIAGNOSIS — I10 Essential (primary) hypertension: Secondary | ICD-10-CM | POA: Diagnosis not present

## 2018-05-19 NOTE — Assessment & Plan Note (Signed)
1.  Stage I multicentric left breast cancer: -Status post left mastectomy and sentinel lymph node sampling on 03/22/2011, 0.4 cm and 0.2 cm, triple negative -She did not receive any adjuvant chemotherapy. - She reportedly had a right breast mammogram done in [place] in May of this year.  Will obtain reports of it.  She tells me that it was normal.   -right breast examination today was within normal limits.  Left mastectomy site is also within normal limits. - She will come back in a year for follow-up.  We will arrange another mammogram next year.  2.  Osteopenia: - We reviewed the results of the DEXA scan dated 03/26/2018 which shows T score of -1.8.  The T score was -1.5 in August 2017. - I have counseled her to start taking calcium and vitamin D twice daily.

## 2018-05-19 NOTE — Patient Instructions (Signed)
[facility] Discharge Instructions     Thank you for choosing [facility] at [facility] to provide your oncology and hematology care.  To afford each patient quality time with our provider, please arrive at least 15 minutes before your scheduled appointment time.   If you have a lab appointment with the Cancer Center please come in thru the  Main Entrance and check in at the main information desk  You need to re-schedule your appointment should you arrive 10 or more minutes late.  We strive to give you quality time with our providers, and arriving late affects you and other patients whose appointments are after yours.  Also, if you no show three or more times for appointments you may be dismissed from the clinic at the providers discretion.     Again, thank you for choosing [facility].  Our hope is that these requests will decrease the amount of time that you wait before being seen by our physicians.       _____________________________________________________________  Should you have questions after your visit to [facility], please contact our office at [phone] between the hours of 8:00 a.m. and 4:30 p.m.  Voicemails left after 4:00 p.m. will not be returned until the following business day.  For prescription refill requests, have your pharmacy contact our office and allow 72 hours.    Cancer Center Support Programs:   > Cancer Support Group  2nd Tuesday of the month 1pm-2pm, Journey Room    

## 2018-05-19 NOTE — Progress Notes (Signed)
[address] [place], [place] [address]   CLINIC:  Medical Oncology/Hematology  PCP:  [name], [place] [address] [phone]   REASON FOR VISIT: Follow-up for Stage I left breast cancer; ER-/PR-/HER2-  CURRENT THERAPY: Observation   BRIEF ONCOLOGIC HISTORY:    Breast cancer ([place])   03/14/2011 Initial Diagnosis    Breast cancer ([place])    11/14/2016 Mammogram    BIRADS 1      INTERVAL HISTORY:  [name] 84 y.o. female returns for routine follow-up for stage I left breast cancer. She is here today and doing well. She had her mammogram in May and it was normal. She has no other complaints at this time. She denies any nausea, vomiting, or diarrhea. Denies any new pains or lumps. Denies any hot flashes. Denies any bleeding and easy bruising. She reports her appetite and energy level at 100% and she has no problem maintaining her weight.     REVIEW OF SYSTEMS:  Review of Systems  All other systems reviewed and are negative.    PAST MEDICAL/SURGICAL HISTORY:  Past Medical History:  Diagnosis Date  . Anemia   . Arthritis   . Breast cancer ([place]) 2011   left; no adjuvent therapy, Dr. Sonny Dandy  . Chronic bronchitis   . Colon polyps    Previous colonoscopy by Dr. Irving Shows  . Coronary artery disease, non-occlusive 2006   Roughly 40% RCA.; Negative Myoview in June 2013  . Diverticulosis of colon   . Gout   . Hemorrhoids   . HTN (hypertension)   . Hyperlipidemia   . Hypothyroid   . MI (myocardial infarction) ([place]) 1973   By report.   Past Surgical History:  Procedure Laterality Date  . ABDOMINAL HYSTERECTOMY     TAH  . CARDIAC CATHETERIZATION  2006   Roughly 40% RCA lesion, otherwise normal.  . CATARACT EXTRACTION W/PHACO  07/02/2011   Procedure: CATARACT EXTRACTION PHACO AND INTRAOCULAR LENS PLACEMENT (IOC);  Surgeon: Williams Che, MD;  Location: [facility];  Service: Ophthalmology;  Laterality: Left;   CDE:3.45  . CATARACT EXTRACTION W/PHACO  04/17/2012   Procedure: CATARACT EXTRACTION PHACO AND INTRAOCULAR LENS PLACEMENT (IOC);  Surgeon: Tonny Branch, MD;  Location: [facility];  Service: Ophthalmology;  Laterality: Right;  CDE:13.40  . CHOLECYSTECTOMY    . COLONOSCOPY      ?3 years ago. Per pt, diverticulosis, hemorrhoids  . COLONOSCOPY  04/06/2011   SLF: 1. Sessile polyp at the ileocecal valve 2. Diverticulosis, moderate 3. Internal hemorrhoids, Large-causing rectal bleeding associated with straining. tubular adenoma.  Marland Kitchen HEMORRHOID SURGERY    . LAPAROSCOPIC LYSIS OF ADHESIONS  2008   Dr. Zella Richer with repair of incisional hernia noted -   . MASTECTOMY  2011   left  . NM MYOVIEW LTD  June 2013   No ischemia or infarction. Normal EF  . TRANSTHORACIC ECHOCARDIOGRAM  June 2013; 12/2013   a) Normal EF, no significant valve disease.; b) EF 50-55%, Gr1 DD, mild LVH.      SOCIAL HISTORY:  Social History   Socioeconomic History  . Marital status: Married    Spouse name: Not on file  . Number of children: 2  . Years of education: Not on file  . Highest education level: Not on file  Occupational History  . Occupation: retired    Fish farm manager: RETIRED  Social Needs  . Financial resource strain: Not on file  . Food insecurity:  Worry: Not on file    Inability: Not on file  . Transportation needs:    Medical: Not on file    Non-medical: Not on file  Tobacco Use  . Smoking status: Never Smoker  . Smokeless tobacco: Never Used  Substance and Sexual Activity  . Alcohol use: No  . Drug use: No  . Sexual activity: Not Currently  Lifestyle  . Physical activity:    Days per week: Not on file    Minutes per session: Not on file  . Stress: Not on file  Relationships  . Social connections:    Talks on phone: Not on file    Gets together: Not on file    Attends religious service: Not on file    Active member of club or organization: Not on file    Attends meetings of clubs or  organizations: Not on file    Relationship status: Not on file  . Intimate partner violence:    Fear of current or ex partner: Not on file    Emotionally abused: Not on file    Physically abused: Not on file    Forced sexual activity: Not on file  Other Topics Concern  . Not on file  Social History Narrative   She is a very pleasant married African American woman with 2 children and 3 grandchildren. She does with her husband who is retired Social research officer, government. They now live on a large farm in the [place] area with a roughly 1/4 mile driveway. She usually walks a mile about twice a day, one by herself and 1 with her husband. She also enjoys doing gardening.    FAMILY HISTORY:  Family History  Problem Relation Age of Onset  . Prostate cancer Father   . Breast cancer Sister   . Breast cancer Other        paternal niece  . Colon cancer Neg Hx   . Anesthesia problems Neg Hx   . Hypotension Neg Hx   . Malignant hyperthermia Neg Hx   . Pseudochol deficiency Neg Hx   . Liver disease Neg Hx     CURRENT MEDICATIONS:  Outpatient Encounter Medications as of 05/19/2018  Medication Sig  . allopurinol (ZYLOPRIM) 300 MG tablet Take 300 mg by mouth daily.    Marland Kitchen alum & mag hydroxide-simeth (MAALOX/MYLANTA) 200-200-20 MG/5ML suspension Take 30 mLs by mouth every 6 (six) hours as needed for indigestion or heartburn (dyspepsia).  Marland Kitchen amLODipine (NORVASC) 5 MG tablet TAKE (1) TABLET BY MOUTH ONCE DAILY.  Marland Kitchen aspirin EC 81 MG tablet Take 81 mg by mouth daily.  Marland Kitchen atorvastatin (LIPITOR) 20 MG tablet TAKE (1) TABLET BY MOUTH AT BEDTIME FOR CHOLESTEROL.  Marland Kitchen enalapril (VASOTEC) 10 MG tablet Take 10 mg by mouth daily.  . furosemide (LASIX) 20 MG tablet Take 20 mg by mouth daily.   Marland Kitchen levothyroxine (SYNTHROID, LEVOTHROID) 50 MCG tablet Take 1 tablet (50 mcg total) by mouth daily.  . metoprolol succinate (TOPROL-XL) 50 MG 24 hr tablet Take 50 mg by mouth daily. Take with or immediately following a meal.  . Multiple  Vitamins-Minerals (MULTIVITAMINS THER. W/MINERALS) TABS Take 1 tablet by mouth daily.    Marland Kitchen NITROSTAT 0.4 MG SL tablet DISSOLVE 1 TABLET UNDER TONGUE EVERY 5 MINUTES AS NEEDED FOR CHEST PAIN.  Marland Kitchen pantoprazole (PROTONIX) 40 MG tablet TAKE ONE TABLET BY MOUTH DAILY.  Marland Kitchen potassium chloride SA (K-DUR,KLOR-CON) 20 MEQ tablet Take 1 tablet (20 mEq total) by mouth daily.  Marland Kitchen Propylene Glycol (  SYSTANE BALANCE) 0.6 % SOLN Place 1 drop into both eyes 2 (two) times daily.   No facility-administered encounter medications on file as of 05/19/2018.     ALLERGIES:  Allergies  Allergen Reactions  . Bee Venom Anaphylaxis  . Ciprofloxacin Nausea And Vomiting  . Clindamycin Hcl     Does not know  . Flagyl [Metronidazole Hcl] Nausea And Vomiting  . Shellfish-Derived Products     Rash developed so patient has steered clear of any since last year  . Sulfa Antibiotics Nausea Only     PHYSICAL EXAM:  ECOG Performance status: 1  Vitals:   05/19/18 1151  BP: 135/71  Pulse: 63  Resp: 16  Temp: 98.2 F (36.8 C)  SpO2: 99%   Filed Weights   05/19/18 1151  Weight: 161 lb 6.4 oz (73.2 kg)    Physical Exam  Constitutional: She is oriented to person, place, and time. She appears well-developed and well-nourished.  Musculoskeletal: Normal range of motion.  Neurological: She is alert and oriented to person, place, and time.  Skin: Skin is warm and dry.  Psychiatric: She has a normal mood and affect. Her behavior is normal. Judgment and thought content normal.  Breast: RIGHT: No palpable masses, no skin changes or nipple discharge, no adenopathy.              LEFT: Mastectomy site has no palpable nodules.Marland Kitchen  LABORATORY DATA:  I have reviewed the labs as listed.  CBC    Component Value Date/Time   WBC 7.3 05/12/2018 1127   RBC 4.11 05/12/2018 1127   HGB 12.7 05/12/2018 1127   HCT 40.7 05/12/2018 1127   PLT 174 05/12/2018 1127   MCV 99.0 05/12/2018 1127   MCH 30.9 05/12/2018 1127   MCHC 31.2  05/12/2018 1127   RDW 14.0 05/12/2018 1127   LYMPHSABS 2.5 05/12/2018 1127   MONOABS 0.4 05/12/2018 1127   EOSABS 0.3 05/12/2018 1127   BASOSABS 0.1 05/12/2018 1127   CMP Latest Ref Rng & Units 05/12/2018 06/18/2017 06/12/2016  Glucose 70 - 99 mg/dL 99 103(H) 105(H)  BUN 8 - 23 mg/dL 17 19 15   Creatinine 0.44 - 1.00 mg/dL 0.72 0.74 0.75  Sodium 135 - 145 mmol/L 141 136 136  Potassium 3.5 - 5.1 mmol/L 4.0 3.5 4.6  Chloride 98 - 111 mmol/L 101 97(L) 99(L)  CO2 22 - 32 mmol/L 30 27 31   Calcium 8.9 - 10.3 mg/dL 9.2 9.0 9.5  Total Protein 6.5 - 8.1 g/dL 8.3(H) 7.7 8.3(H)  Total Bilirubin 0.3 - 1.2 mg/dL 1.7(H) 1.6(H) 1.1  Alkaline Phos 38 - 126 U/L 53 57 66  AST 15 - 41 U/L 22 19 20   ALT 0 - 44 U/L 17 13(L) 14      I have reviewed Francene Finders, NP's note and agree with the documentation.  I personally performed a face-to-face visit, made revisions and my assessment and plan is as follows.      ASSESSMENT & PLAN:   Breast cancer ([place]) 1.  Stage I multicentric left breast cancer: -Status post left mastectomy and sentinel lymph node sampling on 03/22/2011, 0.4 cm and 0.2 cm, triple negative -She did not receive any adjuvant chemotherapy. - She reportedly had a right breast mammogram done in [place] in May of this year.  Will obtain reports of it.  She tells me that it was normal.   -right breast examination today was within normal limits.  Left mastectomy site is also within normal limits. - She  will come back in a year for follow-up.  We will arrange another mammogram next year.  2.  Osteopenia: - We reviewed the results of the DEXA scan dated 03/26/2018 which shows T score of -1.8.  The T score was -1.5 in August 2017. - I have counseled her to start taking calcium and vitamin D twice daily.      Orders placed this encounter:  Orders Placed This Encounter  Procedures  . MM Digital Diagnostic Unilat R  . Protein electrophoresis, serum  . CBC with Differential/Platelet  .  Comprehensive metabolic panel  . Lactate dehydrogenase      Derek Jack, MD [place] [phone]

## 2018-06-05 ENCOUNTER — Other Ambulatory Visit: Payer: Self-pay | Admitting: Cardiology

## 2018-08-01 ENCOUNTER — Other Ambulatory Visit: Payer: Self-pay | Admitting: Cardiology

## 2018-08-01 NOTE — Telephone Encounter (Signed)
Rx has been sent to the pharmacy electronically. ° °

## 2018-08-15 DIAGNOSIS — M1991 Primary osteoarthritis, unspecified site: Secondary | ICD-10-CM | POA: Diagnosis not present

## 2018-08-15 DIAGNOSIS — E782 Mixed hyperlipidemia: Secondary | ICD-10-CM | POA: Diagnosis not present

## 2018-08-15 DIAGNOSIS — Z6831 Body mass index (BMI) 31.0-31.9, adult: Secondary | ICD-10-CM | POA: Diagnosis not present

## 2018-08-15 DIAGNOSIS — M159 Polyosteoarthritis, unspecified: Secondary | ICD-10-CM | POA: Diagnosis not present

## 2018-08-15 DIAGNOSIS — E063 Autoimmune thyroiditis: Secondary | ICD-10-CM | POA: Diagnosis not present

## 2018-08-15 DIAGNOSIS — C50919 Malignant neoplasm of unspecified site of unspecified female breast: Secondary | ICD-10-CM | POA: Diagnosis not present

## 2018-08-15 DIAGNOSIS — Z0001 Encounter for general adult medical examination with abnormal findings: Secondary | ICD-10-CM | POA: Diagnosis not present

## 2018-08-15 DIAGNOSIS — E669 Obesity, unspecified: Secondary | ICD-10-CM | POA: Diagnosis not present

## 2018-08-15 DIAGNOSIS — I10 Essential (primary) hypertension: Secondary | ICD-10-CM | POA: Diagnosis not present

## 2018-08-15 DIAGNOSIS — E6609 Other obesity due to excess calories: Secondary | ICD-10-CM | POA: Diagnosis not present

## 2018-08-15 DIAGNOSIS — Z1389 Encounter for screening for other disorder: Secondary | ICD-10-CM | POA: Diagnosis not present

## 2018-08-15 DIAGNOSIS — M5030 Other cervical disc degeneration, unspecified cervical region: Secondary | ICD-10-CM | POA: Diagnosis not present

## 2018-08-15 DIAGNOSIS — E039 Hypothyroidism, unspecified: Secondary | ICD-10-CM | POA: Diagnosis not present

## 2018-08-20 ENCOUNTER — Emergency Department (HOSPITAL_COMMUNITY): Payer: Medicare Other

## 2018-08-20 ENCOUNTER — Inpatient Hospital Stay (HOSPITAL_COMMUNITY)
Admission: EM | Admit: 2018-08-20 | Discharge: 2018-08-23 | DRG: 872 | Disposition: A | Discharge status: Home / Self Care | Payer: Medicare Other | Attending: Family Medicine | Admitting: Family Medicine

## 2018-08-20 ENCOUNTER — Other Ambulatory Visit: Payer: Self-pay

## 2018-08-20 ENCOUNTER — Encounter (HOSPITAL_COMMUNITY): Payer: Self-pay | Admitting: Emergency Medicine

## 2018-08-20 ENCOUNTER — Inpatient Hospital Stay (HOSPITAL_COMMUNITY): Payer: Medicare Other

## 2018-08-20 DIAGNOSIS — A419 Sepsis, unspecified organism: Secondary | ICD-10-CM | POA: Diagnosis not present

## 2018-08-20 DIAGNOSIS — R0989 Other specified symptoms and signs involving the circulatory and respiratory systems: Secondary | ICD-10-CM | POA: Diagnosis present

## 2018-08-20 DIAGNOSIS — Z91030 Bee allergy status: Secondary | ICD-10-CM

## 2018-08-20 DIAGNOSIS — Z881 Allergy status to other antibiotic agents status: Secondary | ICD-10-CM | POA: Diagnosis not present

## 2018-08-20 DIAGNOSIS — I252 Old myocardial infarction: Secondary | ICD-10-CM

## 2018-08-20 DIAGNOSIS — Z882 Allergy status to sulfonamides status: Secondary | ICD-10-CM

## 2018-08-20 DIAGNOSIS — Z853 Personal history of malignant neoplasm of breast: Secondary | ICD-10-CM

## 2018-08-20 DIAGNOSIS — Z9049 Acquired absence of other specified parts of digestive tract: Secondary | ICD-10-CM | POA: Diagnosis not present

## 2018-08-20 DIAGNOSIS — R0902 Hypoxemia: Secondary | ICD-10-CM | POA: Diagnosis present

## 2018-08-20 DIAGNOSIS — Z91013 Allergy to seafood: Secondary | ICD-10-CM

## 2018-08-20 DIAGNOSIS — K5730 Diverticulosis of large intestine without perforation or abscess without bleeding: Secondary | ICD-10-CM | POA: Diagnosis present

## 2018-08-20 DIAGNOSIS — R05 Cough: Secondary | ICD-10-CM | POA: Diagnosis not present

## 2018-08-20 DIAGNOSIS — K760 Fatty (change of) liver, not elsewhere classified: Secondary | ICD-10-CM | POA: Diagnosis not present

## 2018-08-20 DIAGNOSIS — A4189 Other specified sepsis: Principal | ICD-10-CM | POA: Diagnosis present

## 2018-08-20 DIAGNOSIS — M79606 Pain in leg, unspecified: Secondary | ICD-10-CM

## 2018-08-20 DIAGNOSIS — E876 Hypokalemia: Secondary | ICD-10-CM | POA: Diagnosis present

## 2018-08-20 DIAGNOSIS — M7989 Other specified soft tissue disorders: Secondary | ICD-10-CM | POA: Diagnosis not present

## 2018-08-20 DIAGNOSIS — J111 Influenza due to unidentified influenza virus with other respiratory manifestations: Secondary | ICD-10-CM | POA: Diagnosis not present

## 2018-08-20 DIAGNOSIS — J09X2 Influenza due to identified novel influenza A virus with other respiratory manifestations: Secondary | ICD-10-CM | POA: Diagnosis present

## 2018-08-20 DIAGNOSIS — Z7982 Long term (current) use of aspirin: Secondary | ICD-10-CM

## 2018-08-20 DIAGNOSIS — J189 Pneumonia, unspecified organism: Secondary | ICD-10-CM | POA: Diagnosis not present

## 2018-08-20 DIAGNOSIS — E039 Hypothyroidism, unspecified: Secondary | ICD-10-CM | POA: Diagnosis present

## 2018-08-20 DIAGNOSIS — R059 Cough, unspecified: Secondary | ICD-10-CM

## 2018-08-20 DIAGNOSIS — Z8719 Personal history of other diseases of the digestive system: Secondary | ICD-10-CM | POA: Diagnosis not present

## 2018-08-20 DIAGNOSIS — I10 Essential (primary) hypertension: Secondary | ICD-10-CM | POA: Diagnosis not present

## 2018-08-20 DIAGNOSIS — E782 Mixed hyperlipidemia: Secondary | ICD-10-CM | POA: Diagnosis not present

## 2018-08-20 DIAGNOSIS — E785 Hyperlipidemia, unspecified: Secondary | ICD-10-CM | POA: Diagnosis present

## 2018-08-20 DIAGNOSIS — R0602 Shortness of breath: Secondary | ICD-10-CM | POA: Diagnosis not present

## 2018-08-20 DIAGNOSIS — J181 Lobar pneumonia, unspecified organism: Secondary | ICD-10-CM | POA: Diagnosis not present

## 2018-08-20 DIAGNOSIS — Z79899 Other long term (current) drug therapy: Secondary | ICD-10-CM

## 2018-08-20 DIAGNOSIS — Z79890 Hormone replacement therapy (postmenopausal): Secondary | ICD-10-CM

## 2018-08-20 DIAGNOSIS — J208 Acute bronchitis due to other specified organisms: Secondary | ICD-10-CM | POA: Diagnosis present

## 2018-08-20 DIAGNOSIS — M79661 Pain in right lower leg: Secondary | ICD-10-CM | POA: Diagnosis not present

## 2018-08-20 DIAGNOSIS — D696 Thrombocytopenia, unspecified: Secondary | ICD-10-CM | POA: Diagnosis present

## 2018-08-20 DIAGNOSIS — Z803 Family history of malignant neoplasm of breast: Secondary | ICD-10-CM

## 2018-08-20 DIAGNOSIS — Z23 Encounter for immunization: Secondary | ICD-10-CM | POA: Diagnosis not present

## 2018-08-20 DIAGNOSIS — Z9010 Acquired absence of unspecified breast and nipple: Secondary | ICD-10-CM

## 2018-08-20 DIAGNOSIS — M109 Gout, unspecified: Secondary | ICD-10-CM | POA: Diagnosis present

## 2018-08-20 DIAGNOSIS — I2510 Atherosclerotic heart disease of native coronary artery without angina pectoris: Secondary | ICD-10-CM | POA: Diagnosis present

## 2018-08-20 DIAGNOSIS — R509 Fever, unspecified: Secondary | ICD-10-CM | POA: Diagnosis not present

## 2018-08-20 DIAGNOSIS — M79662 Pain in left lower leg: Secondary | ICD-10-CM | POA: Diagnosis not present

## 2018-08-20 DIAGNOSIS — Z90710 Acquired absence of both cervix and uterus: Secondary | ICD-10-CM

## 2018-08-20 LAB — COMPREHENSIVE METABOLIC PANEL
ALT: 13 U/L (ref 0–44)
ALT: 15 U/L (ref 0–44)
AST: 23 U/L (ref 15–41)
AST: 25 U/L (ref 15–41)
Albumin: 4.6 g/dL (ref 3.5–5.0)
Albumin: 4.2 g/dL (ref 3.5–5.0)
Alkaline Phosphatase: 59 U/L (ref 38–126)
Alkaline Phosphatase: 55 U/L (ref 38–126)
Anion gap: 14 (ref 5–15)
Anion gap: 11 (ref 5–15)
BUN: 14 mg/dL (ref 8–23)
BUN: 10 mg/dL (ref 8–23)
CO2: 26 mmol/L (ref 22–32)
CO2: 26 mmol/L (ref 22–32)
Calcium: 9.2 mg/dL (ref 8.9–10.3)
Calcium: 8.6 mg/dL — ABNORMAL LOW (ref 8.9–10.3)
Chloride: 97 mmol/L — ABNORMAL LOW (ref 98–111)
Chloride: 99 mmol/L (ref 98–111)
Creatinine, Ser: 0.72 mg/dL (ref 0.44–1.00)
Creatinine, Ser: 0.71 mg/dL (ref 0.44–1.00)
GFR calc Af Amer: >60 mL/min (ref >60)
GFR calc Af Amer: >60 mL/min (ref >60)
GFR calc non Af Amer: >60 mL/min (ref >60)
GFR calc non Af Amer: >60 mL/min (ref >60)
Glucose, Bld: 179 mg/dL — ABNORMAL HIGH (ref 70–99)
Glucose, Bld: 119 mg/dL — ABNORMAL HIGH (ref 70–99)
Potassium: 2.8 mmol/L — ABNORMAL LOW (ref 3.5–5.1)
Potassium: 3.9 mmol/L (ref 3.5–5.1)
Sodium: 137 mmol/L (ref 135–145)
Sodium: 136 mmol/L (ref 135–145)
Total Bilirubin: 2.3 mg/dL — ABNORMAL HIGH (ref 0.3–1.2)
Total Bilirubin: 1.8 mg/dL — ABNORMAL HIGH (ref 0.3–1.2)
Total Protein: 8.7 g/dL — ABNORMAL HIGH (ref 6.5–8.1)
Total Protein: 8.2 g/dL — ABNORMAL HIGH (ref 6.5–8.1)

## 2018-08-20 LAB — CBC WITH DIFFERENTIAL/PLATELET
Abs Immature Granulocytes: 0.08 10*3/uL — ABNORMAL HIGH (ref 0.00–0.07)
Abs Immature Granulocytes: 0.22 10*3/uL — ABNORMAL HIGH (ref 0.00–0.07)
Basophils Absolute: 0.1 10*3/uL (ref 0.0–0.1)
Basophils Absolute: 0.1 10*3/uL (ref 0.0–0.1)
Basophils Relative: 0 %
Basophils Relative: 0 %
Eosinophils Absolute: 0 10*3/uL (ref 0.0–0.5)
Eosinophils Absolute: 0 10*3/uL (ref 0.0–0.5)
Eosinophils Relative: 0 %
Eosinophils Relative: 0 %
HCT: 41.3 % (ref 36.0–46.0)
HEMATOCRIT: 42.4 % (ref 36.0–46.0)
Hemoglobin: 13.4 g/dL (ref 12.0–15.0)
Hemoglobin: 13 g/dL (ref 12.0–15.0)
Immature Granulocytes: 0 %
Immature Granulocytes: 1 %
Lymphocytes Relative: 3 %
Lymphocytes Relative: 4 %
Lymphs Abs: 0.6 10*3/uL — ABNORMAL LOW (ref 0.7–4.0)
Lymphs Abs: 1 10*3/uL (ref 0.7–4.0)
MCH: 31.5 pg (ref 26.0–34.0)
MCH: 30.5 pg (ref 26.0–34.0)
MCHC: 32.4 g/dL (ref 30.0–36.0)
MCHC: 30.7 g/dL (ref 30.0–36.0)
MCV: 96.9 fL (ref 80.0–100.0)
MCV: 99.5 fL (ref 80.0–100.0)
MONO ABS: 1.6 10*3/uL — AB (ref 0.1–1.0)
Monocytes Absolute: 1.4 10*3/uL — ABNORMAL HIGH (ref 0.1–1.0)
Monocytes Relative: 7 %
Monocytes Relative: 6 %
Neutro Abs: 17.2 10*3/uL — ABNORMAL HIGH (ref 1.7–7.7)
Neutro Abs: 22.7 10*3/uL — ABNORMAL HIGH (ref 1.7–7.7)
Neutrophils Relative %: 90 %
Neutrophils Relative %: 89 %
Platelets: 149 10*3/uL — ABNORMAL LOW (ref 150–400)
Platelets: 138 10*3/uL — ABNORMAL LOW (ref 150–400)
RBC: 4.26 MIL/uL (ref 3.87–5.11)
RBC: 4.26 MIL/uL (ref 3.87–5.11)
RDW: 13.8 % (ref 11.5–15.5)
RDW: 14.1 % (ref 11.5–15.5)
WBC: 19.4 10*3/uL — ABNORMAL HIGH (ref 4.0–10.5)
WBC: 25.6 10*3/uL — ABNORMAL HIGH (ref 4.0–10.5)
nRBC: 0 % (ref 0.0–0.2)
nRBC: 0 % (ref 0.0–0.2)

## 2018-08-20 LAB — TROPONIN I
Troponin I: <0.03 ng/mL (ref <0.03)
Troponin I: <0.03 ng/mL (ref <0.03)

## 2018-08-20 LAB — URINALYSIS, ROUTINE W REFLEX MICROSCOPIC
Bilirubin Urine: NEGATIVE
Glucose, UA: NEGATIVE mg/dL
Hgb urine dipstick: NEGATIVE
Ketones, ur: NEGATIVE mg/dL
Leukocytes,Ua: NEGATIVE
Nitrite: NEGATIVE
Protein, ur: NEGATIVE mg/dL
Specific Gravity, Urine: 1.014 (ref 1.005–1.030)
pH: 7 (ref 5.0–8.0)

## 2018-08-20 LAB — PROTIME-INR
INR: 1.1 (ref 0.8–1.2)
Prothrombin Time: 14.4 seconds (ref 11.4–15.2)

## 2018-08-20 LAB — LACTIC ACID, PLASMA
Lactic Acid, Venous: 2.4 mmol/L (ref 0.5–1.9)
Lactic Acid, Venous: 2 mmol/L (ref 0.5–1.9)

## 2018-08-20 LAB — APTT: aPTT: 33 seconds (ref 24–36)

## 2018-08-20 LAB — INFLUENZA PANEL BY PCR (TYPE A & B)
Influenza A By PCR: NEGATIVE
Influenza B By PCR: NEGATIVE

## 2018-08-20 LAB — FOLATE: FOLATE: 17.9 ng/mL (ref >5.9)

## 2018-08-20 LAB — HEMOGLOBIN A1C
Hgb A1c MFr Bld: 5.5 % (ref 4.8–5.6)
Mean Plasma Glucose: 111.15 mg/dL

## 2018-08-20 LAB — LIPASE, BLOOD: Lipase: 30 U/L (ref 11–51)

## 2018-08-20 LAB — VITAMIN B12: Vitamin B-12: 227 pg/mL (ref 180–914)

## 2018-08-20 LAB — PROCALCITONIN: Procalcitonin: 0.54 ng/mL

## 2018-08-20 LAB — BRAIN NATRIURETIC PEPTIDE: B Natriuretic Peptide: 51 pg/mL (ref 0.0–100.0)

## 2018-08-20 LAB — MAGNESIUM: Magnesium: 1.5 mg/dL — ABNORMAL LOW (ref 1.7–2.4)

## 2018-08-20 MED ORDER — AMLODIPINE BESYLATE 5 MG PO TABS
5.0000 mg | ORAL_TABLET | Freq: Every day | ORAL | Status: DC
  Administered 2018-08-21 – 2018-08-23 (×3): 5 mg via ORAL
  Filled 2018-08-20 (×3): qty 1

## 2018-08-20 MED ORDER — MAGNESIUM SULFATE 2 GM/50ML IV SOLN
2.0000 g | Freq: Once | INTRAVENOUS | Status: AC
  Administered 2018-08-20: 2 g via INTRAVENOUS
  Filled 2018-08-20: qty 50

## 2018-08-20 MED ORDER — SODIUM CHLORIDE 0.9 % IV BOLUS
500.0000 mL | Freq: Once | INTRAVENOUS | Status: AC
  Administered 2018-08-20: 500 mL via INTRAVENOUS

## 2018-08-20 MED ORDER — FAMOTIDINE IN NACL 20-0.9 MG/50ML-% IV SOLN
20.0000 mg | Freq: Once | INTRAVENOUS | Status: AC
  Administered 2018-08-20: 20 mg via INTRAVENOUS
  Filled 2018-08-20: qty 50

## 2018-08-20 MED ORDER — POTASSIUM CHLORIDE CRYS ER 20 MEQ PO TBCR
60.0000 meq | EXTENDED_RELEASE_TABLET | Freq: Once | ORAL | Status: AC
  Administered 2018-08-20: 60 meq via ORAL
  Filled 2018-08-20: qty 3

## 2018-08-20 MED ORDER — POTASSIUM CHLORIDE IN NACL 20-0.9 MEQ/L-% IV SOLN
INTRAVENOUS | Status: DC
  Administered 2018-08-20 – 2018-08-22 (×3): via INTRAVENOUS

## 2018-08-20 MED ORDER — ONDANSETRON HCL 4 MG/2ML IJ SOLN
4.0000 mg | Freq: Once | INTRAMUSCULAR | Status: AC
  Administered 2018-08-20: 4 mg via INTRAVENOUS
  Filled 2018-08-20: qty 2

## 2018-08-20 MED ORDER — ATORVASTATIN CALCIUM 20 MG PO TABS
20.0000 mg | ORAL_TABLET | Freq: Every day | ORAL | Status: DC
  Administered 2018-08-20 – 2018-08-22 (×3): 20 mg via ORAL
  Filled 2018-08-20 (×4): qty 1

## 2018-08-20 MED ORDER — ACETAMINOPHEN 325 MG PO TABS
650.0000 mg | ORAL_TABLET | Freq: Four times a day (QID) | ORAL | Status: DC | PRN
  Administered 2018-08-20 – 2018-08-21 (×3): 650 mg via ORAL
  Filled 2018-08-20 (×3): qty 2

## 2018-08-20 MED ORDER — SODIUM CHLORIDE 0.9 % IN NEBU
INHALATION_SOLUTION | RESPIRATORY_TRACT | Status: AC
  Administered 2018-08-20: 3 mL
  Filled 2018-08-20: qty 3

## 2018-08-20 MED ORDER — ADULT MULTIVITAMIN W/MINERALS CH
1.0000 | ORAL_TABLET | Freq: Every day | ORAL | Status: DC
  Administered 2018-08-20 – 2018-08-23 (×4): 1 via ORAL
  Filled 2018-08-20 (×4): qty 1

## 2018-08-20 MED ORDER — POLYVINYL ALCOHOL 1.4 % OP SOLN
1.0000 [drp] | Freq: Two times a day (BID) | OPHTHALMIC | Status: DC
  Administered 2018-08-20 – 2018-08-23 (×6): 1 [drp] via OPHTHALMIC
  Filled 2018-08-20 (×3): qty 15

## 2018-08-20 MED ORDER — IOHEXOL 350 MG/ML SOLN
100.0000 mL | Freq: Once | INTRAVENOUS | Status: AC | PRN
  Administered 2018-08-20: 50 mL via INTRAVENOUS

## 2018-08-20 MED ORDER — PNEUMOCOCCAL VAC POLYVALENT 25 MCG/0.5ML IJ INJ
0.5000 mL | INJECTION | INTRAMUSCULAR | Status: AC
  Administered 2018-08-21: 0.5 mL via INTRAMUSCULAR
  Filled 2018-08-20: qty 0.5

## 2018-08-20 MED ORDER — SODIUM CHLORIDE 0.9 % IV SOLN
INTRAVENOUS | Status: DC
  Administered 2018-08-20: 08:00:00 via INTRAVENOUS

## 2018-08-20 MED ORDER — LEVALBUTEROL HCL 0.63 MG/3ML IN NEBU: 0.6300 mg | INHALATION_SOLUTION | Freq: Four times a day (QID) | RESPIRATORY_TRACT | Status: DC | PRN

## 2018-08-20 MED ORDER — LEVOTHYROXINE SODIUM 50 MCG PO TABS
50.0000 ug | ORAL_TABLET | Freq: Every day | ORAL | Status: DC
  Administered 2018-08-21 – 2018-08-23 (×3): 50 ug via ORAL
  Filled 2018-08-20 (×3): qty 1

## 2018-08-20 MED ORDER — ENOXAPARIN SODIUM 40 MG/0.4ML ~~LOC~~ SOLN
40.0000 mg | SUBCUTANEOUS | Status: DC
  Administered 2018-08-20 – 2018-08-22 (×3): 40 mg via SUBCUTANEOUS
  Filled 2018-08-20 (×3): qty 0.4

## 2018-08-20 MED ORDER — IOHEXOL 300 MG/ML  SOLN
100.0000 mL | Freq: Once | INTRAMUSCULAR | Status: AC | PRN
  Administered 2018-08-20: 100 mL via INTRAVENOUS

## 2018-08-20 MED ORDER — ALLOPURINOL 300 MG PO TABS
300.0000 mg | ORAL_TABLET | Freq: Every day | ORAL | Status: DC
  Administered 2018-08-20 – 2018-08-23 (×4): 300 mg via ORAL
  Filled 2018-08-20 (×4): qty 1

## 2018-08-20 MED ORDER — POTASSIUM CHLORIDE CRYS ER 20 MEQ PO TBCR
20.0000 meq | EXTENDED_RELEASE_TABLET | Freq: Every day | ORAL | Status: DC
  Administered 2018-08-21 – 2018-08-23 (×3): 20 meq via ORAL
  Filled 2018-08-20 (×3): qty 1

## 2018-08-20 MED ORDER — SODIUM CHLORIDE 0.9 % IV SOLN
500.0000 mg | INTRAVENOUS | Status: DC
  Administered 2018-08-20: 500 mg via INTRAVENOUS
  Filled 2018-08-20 (×2): qty 500

## 2018-08-20 MED ORDER — PROPYLENE GLYCOL 0.6 % OP SOLN: 1.0000 [drp] | Freq: Two times a day (BID) | OPHTHALMIC | Status: DC

## 2018-08-20 MED ORDER — SODIUM CHLORIDE 0.9 % IV SOLN
500.0000 mg | INTRAVENOUS | Status: DC
  Administered 2018-08-21 – 2018-08-22 (×2): 500 mg via INTRAVENOUS
  Filled 2018-08-20 (×2): qty 500

## 2018-08-20 MED ORDER — ONDANSETRON HCL 4 MG PO TABS: 4.0000 mg | ORAL_TABLET | Freq: Four times a day (QID) | ORAL | Status: DC | PRN

## 2018-08-20 MED ORDER — ASPIRIN EC 81 MG PO TBEC
81.0000 mg | DELAYED_RELEASE_TABLET | Freq: Every day | ORAL | Status: DC
  Administered 2018-08-20 – 2018-08-23 (×4): 81 mg via ORAL
  Filled 2018-08-20 (×4): qty 1

## 2018-08-20 MED ORDER — SODIUM CHLORIDE 0.9 % IV SOLN
2.0000 g | INTRAVENOUS | Status: DC
  Administered 2018-08-21 – 2018-08-22 (×2): 2 g via INTRAVENOUS
  Filled 2018-08-20 (×2): qty 20

## 2018-08-20 MED ORDER — LEVALBUTEROL HCL 1.25 MG/0.5ML IN NEBU
1.2500 mg | INHALATION_SOLUTION | Freq: Once | RESPIRATORY_TRACT | Status: AC
  Administered 2018-08-20: 1.25 mg via RESPIRATORY_TRACT
  Filled 2018-08-20: qty 0.5

## 2018-08-20 MED ORDER — ALUM & MAG HYDROXIDE-SIMETH 200-200-20 MG/5ML PO SUSP: 30.0000 mL | Freq: Four times a day (QID) | ORAL | Status: DC | PRN

## 2018-08-20 MED ORDER — MORPHINE SULFATE (PF) 4 MG/ML IV SOLN
4.0000 mg | Freq: Once | INTRAVENOUS | Status: AC
  Administered 2018-08-20: 4 mg via INTRAVENOUS
  Filled 2018-08-20: qty 1

## 2018-08-20 MED ORDER — SODIUM CHLORIDE 0.9 % IV SOLN
1.0000 g | Freq: Once | INTRAVENOUS | Status: AC
  Administered 2018-08-20: 1 g via INTRAVENOUS
  Filled 2018-08-20: qty 10

## 2018-08-20 MED ORDER — IPRATROPIUM-ALBUTEROL 0.5-2.5 (3) MG/3ML IN SOLN
3.0000 mL | Freq: Once | RESPIRATORY_TRACT | Status: AC
  Administered 2018-08-20: 3 mL via RESPIRATORY_TRACT
  Filled 2018-08-20: qty 3

## 2018-08-20 MED ORDER — PANTOPRAZOLE SODIUM 40 MG PO TBEC
40.0000 mg | DELAYED_RELEASE_TABLET | Freq: Every day | ORAL | Status: DC
  Administered 2018-08-20 – 2018-08-23 (×4): 40 mg via ORAL
  Filled 2018-08-20 (×4): qty 1

## 2018-08-20 MED ORDER — METOPROLOL SUCCINATE ER 50 MG PO TB24
50.0000 mg | ORAL_TABLET | Freq: Every day | ORAL | Status: DC
  Administered 2018-08-20 – 2018-08-23 (×4): 50 mg via ORAL
  Filled 2018-08-20 (×3): qty 1

## 2018-08-20 MED ORDER — ACETAMINOPHEN 650 MG RE SUPP: 650.0000 mg | Freq: Four times a day (QID) | RECTAL | Status: DC | PRN

## 2018-08-20 MED ORDER — ONDANSETRON HCL 4 MG/2ML IJ SOLN: 4.0000 mg | Freq: Four times a day (QID) | INTRAMUSCULAR | Status: DC | PRN

## 2018-08-20 NOTE — ED Notes (Signed)
Respiratory paged at this time for tx.  

## 2018-08-20 NOTE — ED Notes (Signed)
Patient transported to CT 

## 2018-08-20 NOTE — ED Provider Notes (Signed)
[facility] EMERGENCY DEPARTMENT Provider Note   CSN: [id number] Arrival date & time: 08/20/18  2122    History   Chief Complaint Chief Complaint  Patient presents with  . Cough    HPI Angelica [name] is a 85 y.o. female.     HPI   86yF with cough and congestion. Onset yesterday. Persistent since. Nausea and vomited once. Denies abdominal pain. No fever. Has had chills. No sick contacts. Feels generally weak.   Past Medical History:  Diagnosis Date  . Anemia   . Arthritis   . Breast cancer ([place]) 2011   left; no adjuvent therapy, Dr. Sonny Dandy  . Chronic bronchitis   . Colon polyps    Previous colonoscopy by Dr. Irving Shows  . Coronary artery disease, non-occlusive 2006   Roughly 40% RCA.; Negative Myoview in June 2013  . Diverticulosis of colon   . Gout   . Hemorrhoids   . HTN (hypertension)   . Hyperlipidemia   . Hypothyroid   . MI (myocardial infarction) ([place]) 1973   By report.    Patient Active Problem List   Diagnosis Date Noted  . Abnormal LFTs 04/05/2015  . Essential hypertension 02/06/2015  . Obesity (BMI 30-39.9) 06/21/2013  . Bilateral lower extremity edema 06/21/2013  . Coronary artery disease, non-occlusive   . Hypertensive cardiovascular disease- mild LVH, grade 1 diastolic dysfunction 48/25/0037  . Hypokalemia 10/22/2011  . Hypothyroidism 10/22/2011  . Hyperlipidemia 10/22/2011  . Diverticulosis of colon 03/14/2011  . Diverticulitis of colon 03/14/2011    Class: History of  . Breast cancer ([place]) 03/14/2011    Class: History of  . Personal history of colonic polyps 03/14/2011  . LLQ pain 03/14/2011  . Rectal bleed 03/14/2011  . Diarrhea 03/14/2011    Past Surgical History:  Procedure Laterality Date  . ABDOMINAL HYSTERECTOMY     TAH  . CARDIAC CATHETERIZATION  2006   Roughly 40% RCA lesion, otherwise normal.  . CATARACT EXTRACTION W/PHACO  07/02/2011   Procedure: CATARACT EXTRACTION PHACO AND INTRAOCULAR LENS PLACEMENT (IOC);  Surgeon:  Williams Che, MD;  Location: [facility];  Service: Ophthalmology;  Laterality: Left;  CDE:3.45  . CATARACT EXTRACTION W/PHACO  04/17/2012   Procedure: CATARACT EXTRACTION PHACO AND INTRAOCULAR LENS PLACEMENT (IOC);  Surgeon: Tonny Branch, MD;  Location: [facility];  Service: Ophthalmology;  Laterality: Right;  CDE:13.40  . CHOLECYSTECTOMY    . COLONOSCOPY      ?3 years ago. Per pt, diverticulosis, hemorrhoids  . COLONOSCOPY  04/06/2011   SLF: 1. Sessile polyp at the ileocecal valve 2. Diverticulosis, moderate 3. Internal hemorrhoids, Large-causing rectal bleeding associated with straining. tubular adenoma.  Marland Kitchen HEMORRHOID SURGERY    . LAPAROSCOPIC LYSIS OF ADHESIONS  2008   Dr. Zella Richer with repair of incisional hernia noted -   . MASTECTOMY  2011   left  . NM MYOVIEW LTD  June 2013   No ischemia or infarction. Normal EF  . TRANSTHORACIC ECHOCARDIOGRAM  June 2013; 12/2013   a) Normal EF, no significant valve disease.; b) EF 50-55%, Gr1 DD, mild LVH.      OB History   No obstetric history on file.      Home Medications    Prior to Admission medications   Medication Sig Start Date End Date Taking? Authorizing Provider  allopurinol (ZYLOPRIM) 300 MG tablet Take 300 mg by mouth daily.      [provider]  alum & mag hydroxide-simeth (MAALOX/MYLANTA) 200-200-20 MG/5ML suspension Take 30 mLs  by mouth every 6 (six) hours as needed for indigestion or heartburn (dyspepsia). 12/28/13   Black, Lezlie Octave, NP  amLODipine (NORVASC) 5 MG tablet TAKE (1) TABLET BY MOUTH ONCE DAILY. 08/01/18   Leonie Man, MD  aspirin EC 81 MG tablet Take 81 mg by mouth daily.    [provider]  atorvastatin (LIPITOR) 20 MG tablet TAKE (1) TABLET BY MOUTH AT BEDTIME FOR CHOLESTEROL. 06/05/18   Leonie Man, MD  enalapril (VASOTEC) 10 MG tablet Take 10 mg by mouth daily.    [provider]  furosemide (LASIX) 20 MG tablet Take 20 mg by mouth daily.     [provider]   levothyroxine (SYNTHROID, LEVOTHROID) 50 MCG tablet Take 1 tablet (50 mcg total) by mouth daily. 04/17/18   Cassandria Anger, MD  metoprolol succinate (TOPROL-XL) 50 MG 24 hr tablet Take 50 mg by mouth daily. Take with or immediately following a meal.    [provider]  Multiple Vitamins-Minerals (MULTIVITAMINS THER. W/MINERALS) TABS Take 1 tablet by mouth daily.      [provider]  NITROSTAT 0.4 MG SL tablet DISSOLVE 1 TABLET UNDER TONGUE EVERY 5 MINUTES AS NEEDED FOR CHEST PAIN. 09/26/17   Erlene Quan, PA-C  pantoprazole (PROTONIX) 40 MG tablet TAKE ONE TABLET BY MOUTH DAILY. 06/05/18   Leonie Man, MD  potassium chloride SA (K-DUR,KLOR-CON) 20 MEQ tablet Take 1 tablet (20 mEq total) by mouth daily. 12/14/16   Leonie Man, MD  Propylene Glycol (SYSTANE BALANCE) 0.6 % SOLN Place 1 drop into both eyes 2 (two) times daily.    [provider]    Family History Family History  Problem Relation Age of Onset  . Prostate cancer Father   . Breast cancer Sister   . Breast cancer Other        paternal niece  . Colon cancer Neg Hx   . Anesthesia problems Neg Hx   . Hypotension Neg Hx   . Malignant hyperthermia Neg Hx   . Pseudochol deficiency Neg Hx   . Liver disease Neg Hx     Social History Social History   Tobacco Use  . Smoking status: Never Smoker  . Smokeless tobacco: Never Used  Substance Use Topics  . Alcohol use: No  . Drug use: No     Allergies   Bee venom; Ciprofloxacin; Clindamycin hcl; Flagyl [metronidazole hcl]; Shellfish-derived products; and Sulfa antibiotics   Review of Systems Review of Systems  All systems reviewed and negative, other than as noted in HPI.  Physical Exam Updated Vital Signs BP 138/82   Pulse (!) 109   Temp 99.3 F (37.4 C) (Oral)   Resp (!) 35   SpO2 (!) 89%   Physical Exam Vitals signs and nursing note reviewed.  Constitutional:      General: She is not in acute distress.    Appearance:  She is well-developed.  HENT:     Head: Normocephalic and atraumatic.  Eyes:     General:        Right eye: No discharge.        Left eye: No discharge.     Conjunctiva/sclera: Conjunctivae normal.  Neck:     Musculoskeletal: Neck supple.  Cardiovascular:     Rate and Rhythm: Normal rate and regular rhythm.     Heart sounds: Normal heart sounds. No murmur. No friction rub. No gallop.   Pulmonary:     Effort: Pulmonary effort is normal.  No respiratory distress.     Breath sounds: Normal breath sounds.  Abdominal:     General: There is no distension.     Palpations: Abdomen is soft.     Tenderness: There is no abdominal tenderness.  Musculoskeletal:        General: No tenderness.  Skin:    General: Skin is warm and dry.  Neurological:     Mental Status: She is alert.  Psychiatric:        Behavior: Behavior normal.        Thought Content: Thought content normal.      ED Treatments / Results  Labs (all labs ordered are listed, but only abnormal results are displayed) Labs Reviewed  RESPIRATORY PANEL BY PCR - Abnormal; Notable for the following components:      Result Value   Influenza A H1 2009 DETECTED (*)    All other components within normal limits  CBC WITH DIFFERENTIAL/PLATELET - Abnormal; Notable for the following components:   WBC 19.4 (*)    Platelets 149 (*)    Neutro Abs 17.2 (*)    Lymphs Abs 0.6 (*)    Monocytes Absolute 1.4 (*)    Abs Immature Granulocytes 0.08 (*)    All other components within normal limits  COMPREHENSIVE METABOLIC PANEL - Abnormal; Notable for the following components:   Potassium 2.8 (*)    Chloride 97 (*)    Glucose, Bld 179 (*)    Total Protein 8.7 (*)    Total Bilirubin 2.3 (*)    All other components within normal limits  MAGNESIUM - Abnormal; Notable for the following components:   Magnesium 1.5 (*)    All other components within normal limits  URINALYSIS, ROUTINE W REFLEX MICROSCOPIC - Abnormal; Notable for the following  components:   Hgb urine dipstick SMALL (*)    All other components within normal limits  CBC - Abnormal; Notable for the following components:   WBC 16.0 (*)    RBC 3.79 (*)    Hemoglobin 11.8 (*)    MCV 100.8 (*)    Platelets 131 (*)    All other components within normal limits  COMPREHENSIVE METABOLIC PANEL - Abnormal; Notable for the following components:   Glucose, Bld 111 (*)    Calcium 8.6 (*)    Total Bilirubin 1.6 (*)    All other components within normal limits  CBC WITH DIFFERENTIAL/PLATELET - Abnormal; Notable for the following components:   WBC 25.6 (*)    Platelets 138 (*)    Neutro Abs 22.7 (*)    Monocytes Absolute 1.6 (*)    Abs Immature Granulocytes 0.22 (*)    All other components within normal limits  COMPREHENSIVE METABOLIC PANEL - Abnormal; Notable for the following components:   Glucose, Bld 119 (*)    Calcium 8.6 (*)    Total Protein 8.2 (*)    Total Bilirubin 1.8 (*)    All other components within normal limits  LACTIC ACID, PLASMA - Abnormal; Notable for the following components:   Lactic Acid, Venous 2.4 (*)    All other components within normal limits  LACTIC ACID, PLASMA - Abnormal; Notable for the following components:   Lactic Acid, Venous 2.0 (*)    All other components within normal limits  CBC - Abnormal; Notable for the following components:   RBC 3.85 (*)    All other components within normal limits  BASIC METABOLIC PANEL - Abnormal; Notable for the following components:   Chloride  97 (*)    Glucose, Bld 183 (*)    All other components within normal limits  CULTURE, BLOOD (ROUTINE X 2)  CULTURE, BLOOD (ROUTINE X 2)  INFLUENZA PANEL BY PCR (TYPE A & B)  TROPONIN I  BRAIN NATRIURETIC PEPTIDE  LIPASE, BLOOD  URINALYSIS, ROUTINE W REFLEX MICROSCOPIC  TROPONIN I  PROCALCITONIN  PROTIME-INR  APTT  VITAMIN B12  FOLATE  MAGNESIUM  LEGIONELLA PNEUMOPHILA SEROGP 1 UR AG  STREP PNEUMONIAE URINARY ANTIGEN  HEMOGLOBIN A1C  LACTIC ACID,  PLASMA    EKG EKG Interpretation  Date/Time:  Wednesday August 20 2018 06:40:42 EDT Ventricular Rate:  104 PR Interval:    QRS Duration: 83 QT Interval:  365 QTC Calculation: 481 R Axis:   11 Text Interpretation:  Sinus tachycardia Nonspecific repol abnormality, diffuse leads Confirmed by Virgel Manifold [phone]) on 08/20/2018 7:31:20 AM   Radiology No results found.   Dg Chest 2 View  Result Date: 08/20/2018 CLINICAL DATA:  Cough, fever EXAM: CHEST - 2 VIEW COMPARISON:  12/27/2013 FINDINGS: Cardiomegaly. Both lungs are clear. Disc degenerative disease of the thoracic spine. IMPRESSION: Cardiomegaly without acute abnormality of the lungs. No focal airspace opacity. Electronically Signed   By: Eddie Candle M.D.   On: 08/20/2018 08:41   Ct Angio Chest Pe W And/or Wo Contrast  Result Date: 08/20/2018 CLINICAL DATA:  Chest pain and shortness of breath EXAM: CT ANGIOGRAPHY CHEST WITH CONTRAST TECHNIQUE: Multidetector CT imaging of the chest was performed using the standard protocol during bolus administration of intravenous contrast. Multiplanar CT image reconstructions and MIPs were obtained to evaluate the vascular anatomy. CONTRAST:  74mL OMNIPAQUE IOHEXOL 350 MG/ML SOLN COMPARISON:  Chest radiograph August 20, 2018. FINDINGS: Cardiovascular: There is no demonstrable pulmonary embolus. There is no appreciable thoracic aortic aneurysm or dissection. The visualized great vessels appear unremarkable. Note that the right innominate and left common carotid arteries arise as a common trunk, an anatomic variant. There is no appreciable pericardial effusion or pericardial thickening. There are foci of coronary artery calcification at several sites. The main pulmonary outflow tract measures 3.3 cm, prominent. Mediastinum/Nodes: Thyroid appears unremarkable. There is no appreciable thoracic adenopathy. There is a small hiatal hernia. Lungs/Pleura: There is atelectasis in the right base. There is probable  focus of pneumonia as well in the lateral segment right middle lobe. There is milder atelectatic change in the left base. There is also slight atelectasis in the right upper lobe. There is no appreciable pleural effusion or pleural thickening. Upper Abdomen: Visualized upper abdominal structures appear unremarkable. Musculoskeletal: There are no blastic or lytic bone lesions. No evident chest wall lesions. Review of the MIP images confirms the above findings. IMPRESSION: 1. No demonstrable pulmonary embolus. No thoracic aortic aneurysm or dissection. There are foci of coronary artery calcification. 2. Small area of apparent pneumonia in the lateral segment right middle lobe. There is lower lobe and right upper lobe patchy atelectasis. 3. Prominence of the main pulmonary outflow tract, a finding felt to be indicative of a degree of pulmonary arterial hypertension. 4.  Small hiatal hernia. 5.  No demonstrable thoracic adenopathy. Electronically Signed   By: Lowella Grip III M.D.   On: 08/20/2018 13:26   Ct Abdomen Pelvis W Contrast  Result Date: 08/20/2018 CLINICAL DATA:  Acute generalized abdominal pain EXAM: CT ABDOMEN AND PELVIS WITH CONTRAST TECHNIQUE: Multidetector CT imaging of the abdomen and pelvis was performed using the standard protocol following bolus administration of intravenous contrast. CONTRAST:  17mL OMNIPAQUE  IOHEXOL 300 MG/ML  SOLN COMPARISON:  03/19/2011 FINDINGS: Lower chest: Extensive coronary atherosclerosis. Chronically elevated right diaphragm with mild overlying atelectasis or scar. Hepatobiliary: Hepatic steatosis.Cholecystectomy with normal common bile duct diameter Pancreas: Unremarkable. Spleen: Unremarkable. Adrenals/Urinary Tract: Negative adrenals. No hydronephrosis or ureteral stone. Limited for detecting renal calculi due to excreted contrast. Small left renal cysts. Unremarkable bladder. Stomach/Bowel: No obstruction. Appendectomy. Changes of internal hemorrhoid surgery.  Vascular/Lymphatic: Atherosclerosis.  No mass or adenopathy. Reproductive:Hysterectomy. Other: No ascites or pneumoperitoneum.  Midline hernia repair Musculoskeletal: Advanced lumbar spine degeneration with mild dextroscoliosis. IMPRESSION: 1. No acute finding. 2. Atherosclerosis, colonic diverticulosis, and hepatic steatosis. Electronically Signed   By: Monte Fantasia M.D.   On: 08/20/2018 11:18   US Venous Img Lower Bilateral  Result Date: 08/21/2018 CLINICAL DATA:  Bilateral lower extremity swelling and pain EXAM: BILATERAL LOWER EXTREMITY VENOUS DOPPLER ULTRASOUND TECHNIQUE: Gray-scale sonography with graded compression, as well as color Doppler and duplex ultrasound were performed to evaluate the lower extremity deep venous systems from the level of the common femoral vein and including the common femoral, femoral, profunda femoral, popliteal and calf veins including the posterior tibial, peroneal and gastrocnemius veins when visible. The superficial great saphenous vein was also interrogated. Spectral Doppler was utilized to evaluate flow at rest and with distal augmentation maneuvers in the common femoral, femoral and popliteal veins. COMPARISON:  None. FINDINGS: RIGHT LOWER EXTREMITY Common Femoral Vein: No evidence of thrombus. Normal compressibility, respiratory phasicity and response to augmentation. Saphenofemoral Junction: No evidence of thrombus. Normal compressibility and flow on color Doppler imaging. Profunda Femoral Vein: No evidence of thrombus. Normal compressibility and flow on color Doppler imaging. Femoral Vein: No evidence of thrombus. Normal compressibility, respiratory phasicity and response to augmentation. Popliteal Vein: No evidence of thrombus. Normal compressibility, respiratory phasicity and response to augmentation. Calf Veins: No evidence of thrombus. Normal compressibility and flow on color Doppler imaging. Superficial Great Saphenous Vein: No evidence of thrombus. Normal  compressibility. Venous Reflux:  Not assessed Other Findings:  None. LEFT LOWER EXTREMITY Common Femoral Vein: No evidence of thrombus. Normal compressibility, respiratory phasicity and response to augmentation. Saphenofemoral Junction: No evidence of thrombus. Normal compressibility and flow on color Doppler imaging. Profunda Femoral Vein: No evidence of thrombus. Normal compressibility and flow on color Doppler imaging. Femoral Vein: No evidence of thrombus. Normal compressibility, respiratory phasicity and response to augmentation. Popliteal Vein: No evidence of thrombus. Normal compressibility, respiratory phasicity and response to augmentation. Calf Veins: No evidence of thrombus. Normal compressibility and flow on color Doppler imaging. Superficial Great Saphenous Vein: No evidence of thrombus. Normal compressibility. Venous Reflux:  Not assessed Other Findings:  None. IMPRESSION: No evidence of deep venous thrombosis in either lower extremity. Electronically Signed   By: Jerilynn Mages.  Shick M.D.   On: 08/21/2018 13:12    Procedures Procedures (including critical care time)  Medications Ordered in ED Medications  0.9 %  sodium chloride infusion ( Intravenous Rate/Dose Verify 08/20/18 0821)  famotidine (PEPCID) IVPB 20 mg premix ( Intravenous Stopped 08/20/18 0820)  ondansetron (ZOFRAN) injection 4 mg (4 mg Intravenous Given 08/20/18 0742)  morphine 4 MG/ML injection 4 mg (4 mg Intravenous Given 08/20/18 0743)  potassium chloride SA (K-DUR,KLOR-CON) CR tablet 60 mEq (60 mEq Oral Given 08/20/18 0919)  magnesium sulfate IVPB 2 g 50 mL (2 g Intravenous New Bag/Given 08/20/18 0958)  iohexol (OMNIPAQUE) 300 MG/ML solution 100 mL (100 mLs Intravenous Contrast Given 08/20/18 1034)     Initial Impression / Assessment and Plan /  ED Course  I have reviewed the triage vital signs and the nursing notes.  Pertinent labs & imaging results that were available during my care of the patient were reviewed by me and  considered in my medical decision making (see chart for details).       86yf with cough and fever. Imaging consistent with pneumonia. Abx. Also noted to be influenza positive.  Admit. Hypokalemia. Supplemented.   Final Clinical Impressions(s) / ED Diagnoses   Final diagnoses:  Pneumonia due to infectious organism, unspecified laterality, unspecified part of lung  Hypokalemia    ED Discharge Orders    None       Virgel Manifold, MD 08/30/18 1750

## 2018-08-20 NOTE — ED Notes (Signed)
ED TO INPATIENT HANDOFF REPORT  ED Nurse Name and Phone #: Judson Roch [phone]  S Name/Age/Gender Angelica [name] 85 y.o. female Room/Bed: APA09/APA09  Code Status   Code Status: Prior  Home/SNF/Other Home Patient oriented to: self, place, time and situation Is this baseline? Yes   Triage Complete: Triage complete  Chief Complaint Shortness Of breath  Triage Note Pt C/O cough, congestion, nausea, and vomiting that started yesterday morning. Denies fevers at home.    Allergies Allergies  Allergen Reactions  . Bee Venom Anaphylaxis  . Ciprofloxacin Nausea And Vomiting  . Clindamycin Hcl     Does not know  . Flagyl [Metronidazole Hcl] Nausea And Vomiting  . Shellfish-Derived Products     Rash developed so patient has steered clear of any since last year  . Sulfa Antibiotics Nausea Only    Level of Care/Admitting Diagnosis ED Disposition    ED Disposition Condition [facility]: [facility] [[id number]]  Level of Care: Telemetry [5]  Diagnosis: Sepsis due to undetermined organism [facility]) [[id number]]  Admitting Physician: TAT, DAVID [email or web address]  Attending Physician: TAT, DAVID [[id number]]  Estimated length of stay: past midnight tomorrow  Certification:: I certify this patient will need inpatient services for at least 2 midnights  PT Class (Do Not Modify): Inpatient [101]  PT Acc Code (Do Not Modify): Private [1]       B Medical/Surgery History Past Medical History:  Diagnosis Date  . Anemia   . Arthritis   . Breast cancer ([place]) 2011   left; no adjuvent therapy, Dr. Sonny Dandy  . Chronic bronchitis   . Colon polyps    Previous colonoscopy by Dr. Irving Shows  . Coronary artery disease, non-occlusive 2006   Roughly 40% RCA.; Negative Myoview in June 2013  . Diverticulosis of colon   . Gout   . Hemorrhoids   . HTN (hypertension)   . Hyperlipidemia   . Hypothyroid   . MI (myocardial infarction) ([place]) 1973   By report.   Past Surgical History:  Procedure  Laterality Date  . ABDOMINAL HYSTERECTOMY     TAH  . CARDIAC CATHETERIZATION  2006   Roughly 40% RCA lesion, otherwise normal.  . CATARACT EXTRACTION W/PHACO  07/02/2011   Procedure: CATARACT EXTRACTION PHACO AND INTRAOCULAR LENS PLACEMENT (IOC);  Surgeon: Williams Che, MD;  Location: [facility];  Service: Ophthalmology;  Laterality: Left;  CDE:3.45  . CATARACT EXTRACTION W/PHACO  04/17/2012   Procedure: CATARACT EXTRACTION PHACO AND INTRAOCULAR LENS PLACEMENT (IOC);  Surgeon: Tonny Branch, MD;  Location: [facility];  Service: Ophthalmology;  Laterality: Right;  CDE:13.40  . CHOLECYSTECTOMY    . COLONOSCOPY      ?3 years ago. Per pt, diverticulosis, hemorrhoids  . COLONOSCOPY  04/06/2011   SLF: 1. Sessile polyp at the ileocecal valve 2. Diverticulosis, moderate 3. Internal hemorrhoids, Large-causing rectal bleeding associated with straining. tubular adenoma.  Marland Kitchen HEMORRHOID SURGERY    . LAPAROSCOPIC LYSIS OF ADHESIONS  2008   Dr. Zella Richer with repair of incisional hernia noted -   . MASTECTOMY  2011   left  . NM MYOVIEW LTD  June 2013   No ischemia or infarction. Normal EF  . TRANSTHORACIC ECHOCARDIOGRAM  June 2013; 12/2013   a) Normal EF, no significant valve disease.; b) EF 50-55%, Gr1 DD, mild LVH.      A IV Location/Drains/Wounds Patient Lines/Drains/Airways Status   Active Line/Drains/Airways    Name:   Placement date:   Placement time:  Site:   Days:   Peripheral IV 08/20/18 Right Antecubital   08/20/18    0742    Antecubital   less than 1          Intake/Output Last 24 hours  Intake/Output Summary (Last 24 hours) at 08/20/2018 1606 Last data filed at 08/20/2018 1605 Gross per 24 hour  Intake 1168.25 ml  Output -  Net 1168.25 ml    Labs/Imaging Results for orders placed or performed during the hospital encounter of 08/20/18 (from the past 48 hour(s))  Influenza panel by PCR (type A & B)     Status: None   Collection Time: 08/20/18  7:09 AM  Result Value Ref Range    Influenza A By PCR NEGATIVE NEGATIVE   Influenza B By PCR NEGATIVE NEGATIVE    Comment: (NOTE) The Xpert Xpress Flu assay is intended as an aid in the diagnosis of  influenza and should not be used as a sole basis for treatment.  This  assay is FDA approved for nasopharyngeal swab specimens only. Nasal  washings and aspirates are unacceptable for Xpert Xpress Flu testing. Performed at [facility], [address], [place], [place] [address]   CBC with Differential     Status: Abnormal   Collection Time: 08/20/18  7:46 AM  Result Value Ref Range   WBC 19.4 (H) 4.0 - 10.5 K/uL   RBC 4.26 3.87 - 5.11 MIL/uL   Hemoglobin 13.4 12.0 - 15.0 g/dL   HCT 41.3 36.0 - 46.0 %   MCV 96.9 80.0 - 100.0 fL   MCH 31.5 26.0 - 34.0 pg   MCHC 32.4 30.0 - 36.0 g/dL   RDW 13.8 11.5 - 15.5 %   Platelets 149 (L) 150 - 400 K/uL   nRBC 0.0 0.0 - 0.2 %   Neutrophils Relative % 90 %   Neutro Abs 17.2 (H) 1.7 - 7.7 K/uL   Lymphocytes Relative 3 %   Lymphs Abs 0.6 (L) 0.7 - 4.0 K/uL   Monocytes Relative 7 %   Monocytes Absolute 1.4 (H) 0.1 - 1.0 K/uL   Eosinophils Relative 0 %   Eosinophils Absolute 0.0 0.0 - 0.5 K/uL   Basophils Relative 0 %   Basophils Absolute 0.1 0.0 - 0.1 K/uL   Immature Granulocytes 0 %   Abs Immature Granulocytes 0.08 (H) 0.00 - 0.07 K/uL    Comment: Performed at [facility], [address], [place], [place] [address]  Troponin I - ONCE - STAT     Status: None   Collection Time: 08/20/18  7:46 AM  Result Value Ref Range   Troponin I <0.03 <0.03 ng/mL    Comment: Performed at [facility], [address], [place], [place] [address]  Brain natriuretic peptide     Status: None   Collection Time: 08/20/18  7:46 AM  Result Value Ref Range   B Natriuretic Peptide 51.0 0.0 - 100.0 pg/mL    Comment: Performed at [facility], [address], [place], [place] [address]  Comprehensive metabolic panel     Status: Abnormal   Collection Time: 08/20/18  7:46 AM  Result Value Ref Range    Sodium 137 135 - 145 mmol/L   Potassium 2.8 (L) 3.5 - 5.1 mmol/L   Chloride 97 (L) 98 - 111 mmol/L   CO2 26 22 - 32 mmol/L   Glucose, Bld 179 (H) 70 - 99 mg/dL   BUN 14 8 - 23 mg/dL   Creatinine, Ser 0.72 0.44 - 1.00  mg/dL   Calcium 9.2 8.9 - 10.3 mg/dL   Total Protein 8.7 (H) 6.5 - 8.1 g/dL   Albumin 4.6 3.5 - 5.0 g/dL   AST 23 15 - 41 U/L   ALT 13 0 - 44 U/L   Alkaline Phosphatase 59 38 - 126 U/L   Total Bilirubin 2.3 (H) 0.3 - 1.2 mg/dL   GFR calc non Af Amer >60 >60 mL/min   GFR calc Af Amer >60 >60 mL/min   Anion gap 14 5 - 15    Comment: Performed at [facility], [address] [place], [place] [address]  Lipase, blood     Status: None   Collection Time: 08/20/18  7:46 AM  Result Value Ref Range   Lipase 30 11 - 51 U/L    Comment: Performed at [facility], [address], [place], [place] [address]  Magnesium     Status: Abnormal   Collection Time: 08/20/18  7:46 AM  Result Value Ref Range   Magnesium 1.5 (L) 1.7 - 2.4 mg/dL    Comment: Performed at [facility], [address], [place], [place] [address]  Urinalysis, Routine w reflex microscopic     Status: None   Collection Time: 08/20/18 11:25 AM  Result Value Ref Range   Color, Urine YELLOW YELLOW   APPearance CLEAR CLEAR   Specific Gravity, Urine 1.014 1.005 - 1.030   pH 7.0 5.0 - 8.0   Glucose, UA NEGATIVE NEGATIVE mg/dL   Hgb urine dipstick NEGATIVE NEGATIVE   Bilirubin Urine NEGATIVE NEGATIVE   Ketones, ur NEGATIVE NEGATIVE mg/dL   Protein, ur NEGATIVE NEGATIVE mg/dL   Nitrite NEGATIVE NEGATIVE   Leukocytes,Ua NEGATIVE NEGATIVE    Comment: Performed at [facility], [address], [place], [place] [address]  Troponin I - ONCE - STAT     Status: None   Collection Time: 08/20/18 12:02 PM  Result Value Ref Range   Troponin I <0.03 <0.03 ng/mL    Comment: Performed at [facility], [address], [place], [place] [address]   Dg Chest 2 View  Result Date: 08/20/2018 CLINICAL DATA:  Cough, fever EXAM: CHEST  - 2 VIEW COMPARISON:  12/27/2013 FINDINGS: Cardiomegaly. Both lungs are clear. Disc degenerative disease of the thoracic spine. IMPRESSION: Cardiomegaly without acute abnormality of the lungs. No focal airspace opacity. Electronically Signed   By: Eddie Candle M.D.   On: 08/20/2018 08:41   Ct Angio Chest Pe W And/or Wo Contrast  Result Date: 08/20/2018 CLINICAL DATA:  Chest pain and shortness of breath EXAM: CT ANGIOGRAPHY CHEST WITH CONTRAST TECHNIQUE: Multidetector CT imaging of the chest was performed using the standard protocol during bolus administration of intravenous contrast. Multiplanar CT image reconstructions and MIPs were obtained to evaluate the vascular anatomy. CONTRAST:  15mL OMNIPAQUE IOHEXOL 350 MG/ML SOLN COMPARISON:  Chest radiograph August 20, 2018. FINDINGS: Cardiovascular: There is no demonstrable pulmonary embolus. There is no appreciable thoracic aortic aneurysm or dissection. The visualized great vessels appear unremarkable. Note that the right innominate and left common carotid arteries arise as a common trunk, an anatomic variant. There is no appreciable pericardial effusion or pericardial thickening. There are foci of coronary artery calcification at several sites. The main pulmonary outflow tract measures 3.3 cm, prominent. Mediastinum/Nodes: Thyroid appears unremarkable. There is no appreciable thoracic adenopathy. There is a small hiatal hernia. Lungs/Pleura: There is atelectasis in the right base. There is probable focus of pneumonia as well in the lateral segment right middle lobe. There is  milder atelectatic change in the left base. There is also slight atelectasis in the right upper lobe. There is no appreciable pleural effusion or pleural thickening. Upper Abdomen: Visualized upper abdominal structures appear unremarkable. Musculoskeletal: There are no blastic or lytic bone lesions. No evident chest wall lesions. Review of the MIP images confirms the above findings.  IMPRESSION: 1. No demonstrable pulmonary embolus. No thoracic aortic aneurysm or dissection. There are foci of coronary artery calcification. 2. Small area of apparent pneumonia in the lateral segment right middle lobe. There is lower lobe and right upper lobe patchy atelectasis. 3. Prominence of the main pulmonary outflow tract, a finding felt to be indicative of a degree of pulmonary arterial hypertension. 4.  Small hiatal hernia. 5.  No demonstrable thoracic adenopathy. Electronically Signed   By: Lowella Grip III M.D.   On: 08/20/2018 13:26   Ct Abdomen Pelvis W Contrast  Result Date: 08/20/2018 CLINICAL DATA:  Acute generalized abdominal pain EXAM: CT ABDOMEN AND PELVIS WITH CONTRAST TECHNIQUE: Multidetector CT imaging of the abdomen and pelvis was performed using the standard protocol following bolus administration of intravenous contrast. CONTRAST:  136mL OMNIPAQUE IOHEXOL 300 MG/ML  SOLN COMPARISON:  03/19/2011 FINDINGS: Lower chest: Extensive coronary atherosclerosis. Chronically elevated right diaphragm with mild overlying atelectasis or scar. Hepatobiliary: Hepatic steatosis.Cholecystectomy with normal common bile duct diameter Pancreas: Unremarkable. Spleen: Unremarkable. Adrenals/Urinary Tract: Negative adrenals. No hydronephrosis or ureteral stone. Limited for detecting renal calculi due to excreted contrast. Small left renal cysts. Unremarkable bladder. Stomach/Bowel: No obstruction. Appendectomy. Changes of internal hemorrhoid surgery. Vascular/Lymphatic: Atherosclerosis.  No mass or adenopathy. Reproductive:Hysterectomy. Other: No ascites or pneumoperitoneum.  Midline hernia repair Musculoskeletal: Advanced lumbar spine degeneration with mild dextroscoliosis. IMPRESSION: 1. No acute finding. 2. Atherosclerosis, colonic diverticulosis, and hepatic steatosis. Electronically Signed   By: Monte Fantasia M.D.   On: 08/20/2018 11:18    Pending Labs [employer] (From admission, onward)     Start     Ordered   Signed and Held  Hemoglobin A1c  Once,   R     Signed and Held   Signed and Held  Urinalysis, Routine w reflex microscopic  Once,   R     Signed and Held   Visual merchandiser and Held  CBC  Tomorrow morning,   R     Signed and Held   Signed and Held  Comprehensive metabolic panel  Tomorrow morning,   R     Signed and Held   Signed and Held  Bilirubin, fractionated(tot/dir/indir)  Once,   R     Signed and Held   Signed and Held  Culture, blood (x 2)  BLOOD CULTURE X 2,   STAT    Comments:  INITIATE ANTIBIOTICS WITHIN 1 HOUR AFTER BLOOD CULTURES DRAWN.  If unable to obtain blood cultures, call MD immediately regarding antibiotic instructions.    Signed and Held   Signed and Held  CBC with Differential  ONCE - STAT,   R     Signed and Held   Signed and Held  Comprehensive metabolic panel  ONCE - STAT,   R     Signed and Held   Signed and Held  Lactic acid, plasma  STAT Now then every 3 hours,   STAT     Signed and Held   Signed and Held  Procalcitonin  ONCE - STAT,   R     Signed and Held   Signed and Held  Protime-INR  ONCE - STAT,  R     Signed and Held   Signed and Held  APTT  ONCE - STAT,   R     Signed and Held   Signed and Held  Respiratory Panel by PCR  (Respiratory virus panel with precautions)  Once,   R     Signed and Held   Signed and Held  Vitamin B12  Once,   R     Signed and Held   Signed and Held  Folate  Once,   R     Signed and Held   Signed and Held  Magnesium  Tomorrow morning,   R     Signed and Held   Signed and Held  Legionella Pneumophila Serogp 1 Ur Ag  Once,   R     Signed and Held   Signed and Held  Strep pneumoniae urinary antigen  Once,   R     Signed and Held   Signed and Held  Hemoglobin A1c  Once,   R     Signed and Held          Vitals/Pain Today's Vitals   08/20/18 1203 08/20/18 1220 08/20/18 1331 08/20/18 1600  BP:   134/71 119/66  Pulse:  (!) 117 (!) 111 (!) 105  Resp:   (!) 25 (!) 28  Temp:      TempSrc:      SpO2: (!)  86% 98% 99% 99%  PainSc:        Isolation Precautions Droplet precaution  Medications Medications  0.9 %  sodium chloride infusion ( Intravenous Rate/Dose Verify 08/20/18 1605)  azithromycin (ZITHROMAX) 500 mg in sodium chloride 0.9 % 250 mL IVPB ( Intravenous Stopped 08/20/18 1548)  famotidine (PEPCID) IVPB 20 mg premix ( Intravenous Stopped 08/20/18 0820)  ondansetron (ZOFRAN) injection 4 mg (4 mg Intravenous Given 08/20/18 0742)  morphine 4 MG/ML injection 4 mg (4 mg Intravenous Given 08/20/18 0743)  potassium chloride SA (K-DUR,KLOR-CON) CR tablet 60 mEq (60 mEq Oral Given 08/20/18 0919)  magnesium sulfate IVPB 2 g 50 mL ( Intravenous Stopped 08/20/18 1114)  iohexol (OMNIPAQUE) 300 MG/ML solution 100 mL (100 mLs Intravenous Contrast Given 08/20/18 1034)  ipratropium-albuterol (DUONEB) 0.5-2.5 (3) MG/3ML nebulizer solution 3 mL (3 mLs Nebulization Given 08/20/18 1159)  iohexol (OMNIPAQUE) 350 MG/ML injection 100 mL (50 mLs Intravenous Contrast Given 08/20/18 1307)  cefTRIAXone (ROCEPHIN) 1 g in sodium chloride 0.9 % 100 mL IVPB (0 g Intravenous Stopped 08/20/18 1422)    Mobility walks with person assist Moderate fall risk   Focused Assessments    R Recommendations: See Admitting Provider Note  Report given to: [place]  Additional Notes:

## 2018-08-20 NOTE — H&P (Signed)
History and Physical  Angelica [name] VQX:[id number] DOB: 1932-08-15 DOA: 08/20/2018   PCP: [name], MD   Patient coming from: Home  Chief Complaint: cough, chest congestion, sob  HPI:  Angelica [name] is a 85 y.o. female with medical history of left-sided breast cancer status post mastectomy, hypertension, hyperlipidemia, nonobstructive coronary artery disease, hypothyroidism presented with 1 to 2-day history of coughing, shortness of breath, chest congestion.  The patient began developing some shortness of breath and nonproductive cough on 08/17/2018.  However in the last 24 hours her symptoms have continued to progress with subjective fevers and chills.  She denies any hemoptysis, but complained of some chest discomfort with coughing.  On the morning of admission, the patient had developed nausea and vomiting x3 episodes without any hematemesis.  She had 2 loose bowel movements without any hematochezia or melena.  There is no frank diarrhea.  The patient denies any hemoptysis, dysuria, hematuria.  She denies any recent travels or sick contacts.  She states that her son from [place] recently visited her on 08/16/2018.  He has not traveled outside the country or any place else in the past 30 days except for visiting her here in [place].  The patient denies any new medications.  After developing emesis, the patient has some epigastric discomfort. In the emergency department, the patient was afebrile hemodynamically stable albeit with a low-grade temperature of 99.3 F.  Oxygen saturation was 86% on room air.  With 2 L supplemental oxygen, the patient's oxygen saturation improved to 99%.  BMP showed a potassium of 2.8 with serum creatinine 0.72.  LFTs were unremarkable except for total bilirubin of 2.3.  WBC was 19.4.  BMP was 51.0.  CT angiogram of the chest was negative for dissection or pulmonary embolus, but showed right middle lobe opacity suggestive of possible pneumonia.   CT of the abdomen and pelvis showed hepatic steatosis and colonic diverticulosis.  Assessment/Plan: Sepsis -Present at the time of admission -Patient presented with leukocytosis and tachycardia with hypoxia -Check lactic acid -Procalcitonin -Continue IV fluids  Lobar pneumonia -08/20/2018 CTA chest negative for PE but showed right middle lobe opacity -Continue ceftriaxone and azithromycin -Urine Legionella antigen -Urine Streptococcus pneumoniae antigen -Influenza PCR negative  Essential hypertension -Continue metoprolol succinate -Holding enalapril and furosemide  Nausea and vomiting/abdominal pain -08/20/2018 CT abdomen and pelvis--negative for acute findings -Influenza PCR -Viral respiratory panel  Hypothyroidism -Continue Synthroid  Coronary artery disease -No chest pain presently -Troponin negative x2 -Continue aspirin -EKG--sinus rhythm, nonspecific T wave changes  Thrombocytopenia -Likely due to sepsis -Serum M03 -Folic acid -Monitor for signs of bleeding -A.m. CBC  Hyperlipidemia -Continue statin  Lower extremity Edema -venous duplex  Hypokalemia -replete -check mag    Past Medical History:  Diagnosis Date   Anemia    Arthritis    Breast cancer ([place]) 2011   left; no adjuvent therapy, Dr. Sonny Dandy   Chronic bronchitis    Colon polyps    Previous colonoscopy by Dr. Irving Shows   Coronary artery disease, non-occlusive 2006   Roughly 40% RCA.; Negative Myoview in June 2013   Diverticulosis of colon    Gout    Hemorrhoids    HTN (hypertension)    Hyperlipidemia    Hypothyroid    MI (myocardial infarction) ([place]) 1973   By report.   Past Surgical History:  Procedure Laterality Date   ABDOMINAL HYSTERECTOMY     TAH   CARDIAC CATHETERIZATION  2006  Roughly 40% RCA lesion, otherwise normal.   CATARACT EXTRACTION W/PHACO  07/02/2011   Procedure: CATARACT EXTRACTION PHACO AND INTRAOCULAR LENS PLACEMENT (IOC);  Surgeon: Williams Che, MD;  Location: [facility];  Service: Ophthalmology;  Laterality: Left;  CDE:3.45   CATARACT EXTRACTION W/PHACO  04/17/2012   Procedure: CATARACT EXTRACTION PHACO AND INTRAOCULAR LENS PLACEMENT (IOC);  Surgeon: Tonny Branch, MD;  Location: [facility];  Service: Ophthalmology;  Laterality: Right;  CDE:13.40   CHOLECYSTECTOMY     COLONOSCOPY      ?3 years ago. Per pt, diverticulosis, hemorrhoids   COLONOSCOPY  04/06/2011   SLF: 1. Sessile polyp at the ileocecal valve 2. Diverticulosis, moderate 3. Internal hemorrhoids, Large-causing rectal bleeding associated with straining. tubular adenoma.   HEMORRHOID SURGERY     LAPAROSCOPIC LYSIS OF ADHESIONS  2008   Dr. Zella Richer with repair of incisional hernia noted -    MASTECTOMY  2011   left   NM MYOVIEW LTD  June 2013   No ischemia or infarction. Normal EF   TRANSTHORACIC ECHOCARDIOGRAM  June 2013; 12/2013   a) Normal EF, no significant valve disease.; b) EF 50-55%, Gr1 DD, mild LVH.    Social History:  reports that she has never smoked. She has never used smokeless tobacco. She reports that she does not drink alcohol or use drugs.   Family History  Problem Relation Age of Onset   Prostate cancer Father    Breast cancer Sister    Breast cancer Other        paternal niece   Colon cancer Neg Hx    Anesthesia problems Neg Hx    Hypotension Neg Hx    Malignant hyperthermia Neg Hx    Pseudochol deficiency Neg Hx    Liver disease Neg Hx      Allergies  Allergen Reactions   Bee Venom Anaphylaxis   Ciprofloxacin Nausea And Vomiting   Clindamycin Hcl     Does not know   Flagyl [Metronidazole Hcl] Nausea And Vomiting   Shellfish-Derived Products     Rash developed so patient has steered clear of any since last year   Sulfa Antibiotics Nausea Only     Prior to Admission medications   Medication Sig Start Date End Date Taking? Authorizing Provider  allopurinol (ZYLOPRIM) 300 MG tablet Take 300 mg by mouth daily.       [provider]  alum & mag hydroxide-simeth (MAALOX/MYLANTA) 200-200-20 MG/5ML suspension Take 30 mLs by mouth every 6 (six) hours as needed for indigestion or heartburn (dyspepsia). 12/28/13   Black, Lezlie Octave, NP  amLODipine (NORVASC) 5 MG tablet TAKE (1) TABLET BY MOUTH ONCE DAILY. 08/01/18   Leonie Man, MD  aspirin EC 81 MG tablet Take 81 mg by mouth daily.    [provider]  atorvastatin (LIPITOR) 20 MG tablet TAKE (1) TABLET BY MOUTH AT BEDTIME FOR CHOLESTEROL. 06/05/18   Leonie Man, MD  enalapril (VASOTEC) 10 MG tablet Take 10 mg by mouth daily.    [provider]  furosemide (LASIX) 20 MG tablet Take 20 mg by mouth daily.     [provider]  levothyroxine (SYNTHROID, LEVOTHROID) 50 MCG tablet Take 1 tablet (50 mcg total) by mouth daily. 04/17/18   Cassandria Anger, MD  metoprolol succinate (TOPROL-XL) 50 MG 24 hr tablet Take 50 mg by mouth daily. Take with or immediately following a meal.    [provider]  Multiple Vitamins-Minerals (MULTIVITAMINS THER. W/MINERALS) TABS Take 1  tablet by mouth daily.      [provider]  NITROSTAT 0.4 MG SL tablet DISSOLVE 1 TABLET UNDER TONGUE EVERY 5 MINUTES AS NEEDED FOR CHEST PAIN. 09/26/17   Erlene Quan, PA-C  pantoprazole (PROTONIX) 40 MG tablet TAKE ONE TABLET BY MOUTH DAILY. 06/05/18   Leonie Man, MD  potassium chloride SA (K-DUR,KLOR-CON) 20 MEQ tablet Take 1 tablet (20 mEq total) by mouth daily. 12/14/16   Leonie Man, MD  Propylene Glycol (SYSTANE BALANCE) 0.6 % SOLN Place 1 drop into both eyes 2 (two) times daily.    [provider]    Review of Systems:  Constitutional:  No weight loss, night sweats, Fevers, chills, fatigue.  Head&Eyes: No headache.  No vision loss.  No eye pain or scotoma ENT:  No Difficulty swallowing,Tooth/dental problems,Sore throat,  No ear ache, post nasal drip,  Cardio-vascular:  No chest pain, Orthopnea, PND, swelling in  lower extremities,  dizziness, palpitations  GI:  No  abdominal pain, nausea, vomiting, diarrhea, loss of appetite, hematochezia, melena, heartburn, indigestion, Resp:   No coughing up of blood .No wheezing.No chest wall deformity  Skin:  no rash or lesions.  GU:  no dysuria, change in color of urine, no urgency or frequency. No flank pain.  Musculoskeletal:  No joint pain or swelling. No decreased range of motion. No back pain.  Psych:  No change in mood or affect. No depression or anxiety. Neurologic: No headache, no dysesthesia, no focal weakness, no vision loss. No syncope  Physical Exam: Vitals:   08/20/18 1030 08/20/18 1203 08/20/18 1220 08/20/18 1331  BP: 138/82   134/71  Pulse: (!) 109  (!) 117 (!) 111  Resp: (!) 35   (!) 25  Temp:      TempSrc:      SpO2: (!) 89% (!) 86% 98% 99%   General:  A&O x 3, NAD, nontoxic, pleasant/cooperative Head/Eye: No conjunctival hemorrhage, no icterus, [place]/AT, No nystagmus ENT:  No icterus,  No thrush, good dentition, no pharyngeal exudate Neck:  No masses, no lymphadenpathy, no bruits CV:  RRR, no rub, no gallop, no S3 Lung:  Bibasilar rales L> R, no wheeze Abdomen: soft/NT, +BS, nondistended, no peritoneal signs Ext: No cyanosis, No rashes, No petechiae, No lymphangitis, 1+ nonpitting LE edema Neuro: CNII-XII intact, strength 4/5 in bilateral upper and lower extremities, no dysmetria  Labs on Admission:  Basic Metabolic Panel: Recent Labs  Lab 08/20/18 0746  NA 137  K 2.8*  CL 97*  CO2 26  GLUCOSE 179*  BUN 14  CREATININE 0.72  CALCIUM 9.2  MG 1.5*   Liver Function Tests: Recent Labs  Lab 08/20/18 0746  AST 23  ALT 13  ALKPHOS 59  BILITOT 2.3*  PROT 8.7*  ALBUMIN 4.6   Recent Labs  Lab 08/20/18 0746  LIPASE 30   No results for input(s): AMMONIA in the last 168 hours. CBC: Recent Labs  Lab 08/20/18 0746  WBC 19.4*  NEUTROABS 17.2*  HGB 13.4  HCT 41.3  MCV 96.9  PLT 149*   Coagulation Profile: No  results for input(s): INR, PROTIME in the last 168 hours. Cardiac Enzymes: Recent Labs  Lab 08/20/18 0746 08/20/18 1202  TROPONINI <0.03 <0.03   BNP: Invalid input(s): POCBNP CBG: No results for input(s): GLUCAP in the last 168 hours. Urine analysis:    Component Value Date/Time   COLORURINE YELLOW 08/20/2018 1125   APPEARANCEUR CLEAR 08/20/2018 1125   LABSPEC 1.014 08/20/2018 1125  PHURINE 7.0 08/20/2018 1125   GLUCOSEU NEGATIVE 08/20/2018 1125   HGBUR NEGATIVE 08/20/2018 1125   [place] 08/20/2018 1125   [place] 08/20/2018 1125   PROTEINUR NEGATIVE 08/20/2018 1125   NITRITE NEGATIVE 08/20/2018 1125   LEUKOCYTESUR NEGATIVE 08/20/2018 1125   Sepsis Labs: @LABRCNTIP (procalcitonin:4,lacticidven:4) )No results found for this or any previous visit (from the past 240 hour(s)).   Radiological Exams on Admission: Dg Chest 2 View  Result Date: 08/20/2018 CLINICAL DATA:  Cough, fever EXAM: CHEST - 2 VIEW COMPARISON:  12/27/2013 FINDINGS: Cardiomegaly. Both lungs are clear. Disc degenerative disease of the thoracic spine. IMPRESSION: Cardiomegaly without acute abnormality of the lungs. No focal airspace opacity. Electronically Signed   By: Eddie Candle M.D.   On: 08/20/2018 08:41   Ct Angio Chest Pe W And/or Wo Contrast  Result Date: 08/20/2018 CLINICAL DATA:  Chest pain and shortness of breath EXAM: CT ANGIOGRAPHY CHEST WITH CONTRAST TECHNIQUE: Multidetector CT imaging of the chest was performed using the standard protocol during bolus administration of intravenous contrast. Multiplanar CT image reconstructions and MIPs were obtained to evaluate the vascular anatomy. CONTRAST:  26mL OMNIPAQUE IOHEXOL 350 MG/ML SOLN COMPARISON:  Chest radiograph August 20, 2018. FINDINGS: Cardiovascular: There is no demonstrable pulmonary embolus. There is no appreciable thoracic aortic aneurysm or dissection. The visualized great vessels appear unremarkable. Note that the right  innominate and left common carotid arteries arise as a common trunk, an anatomic variant. There is no appreciable pericardial effusion or pericardial thickening. There are foci of coronary artery calcification at several sites. The main pulmonary outflow tract measures 3.3 cm, prominent. Mediastinum/Nodes: Thyroid appears unremarkable. There is no appreciable thoracic adenopathy. There is a small hiatal hernia. Lungs/Pleura: There is atelectasis in the right base. There is probable focus of pneumonia as well in the lateral segment right middle lobe. There is milder atelectatic change in the left base. There is also slight atelectasis in the right upper lobe. There is no appreciable pleural effusion or pleural thickening. Upper Abdomen: Visualized upper abdominal structures appear unremarkable. Musculoskeletal: There are no blastic or lytic bone lesions. No evident chest wall lesions. Review of the MIP images confirms the above findings. IMPRESSION: 1. No demonstrable pulmonary embolus. No thoracic aortic aneurysm or dissection. There are foci of coronary artery calcification. 2. Small area of apparent pneumonia in the lateral segment right middle lobe. There is lower lobe and right upper lobe patchy atelectasis. 3. Prominence of the main pulmonary outflow tract, a finding felt to be indicative of a degree of pulmonary arterial hypertension. 4.  Small hiatal hernia. 5.  No demonstrable thoracic adenopathy. Electronically Signed   By: Lowella Grip III M.D.   On: 08/20/2018 13:26   Ct Abdomen Pelvis W Contrast  Result Date: 08/20/2018 CLINICAL DATA:  Acute generalized abdominal pain EXAM: CT ABDOMEN AND PELVIS WITH CONTRAST TECHNIQUE: Multidetector CT imaging of the abdomen and pelvis was performed using the standard protocol following bolus administration of intravenous contrast. CONTRAST:  126mL OMNIPAQUE IOHEXOL 300 MG/ML  SOLN COMPARISON:  03/19/2011 FINDINGS: Lower chest: Extensive coronary  atherosclerosis. Chronically elevated right diaphragm with mild overlying atelectasis or scar. Hepatobiliary: Hepatic steatosis.Cholecystectomy with normal common bile duct diameter Pancreas: Unremarkable. Spleen: Unremarkable. Adrenals/Urinary Tract: Negative adrenals. No hydronephrosis or ureteral stone. Limited for detecting renal calculi due to excreted contrast. Small left renal cysts. Unremarkable bladder. Stomach/Bowel: No obstruction. Appendectomy. Changes of internal hemorrhoid surgery. Vascular/Lymphatic: Atherosclerosis.  No mass or adenopathy. Reproductive:Hysterectomy. Other: No ascites or pneumoperitoneum.  Midline  hernia repair Musculoskeletal: Advanced lumbar spine degeneration with mild dextroscoliosis. IMPRESSION: 1. No acute finding. 2. Atherosclerosis, colonic diverticulosis, and hepatic steatosis. Electronically Signed   By: Monte Fantasia M.D.   On: 08/20/2018 11:18    EKG: Independently reviewed. Sinus, nonspecific T-wave abnormalities    Time spent:60 minutes Code Status:   FULL Family Communication:  Daughter updated at bedside 3/11 Disposition Plan: expect 2-3 day hospitalization Consults called: none DVT Prophylaxis: [place] Lovenox  Orson Hartlee, DO  [facility] Pager [phone]  If 7PM-7AM, please contact night-coverage [email or web address] Password [facility] 08/20/2018, 2:52 PM

## 2018-08-20 NOTE — ED Notes (Signed)
Patient transported to X-ray 

## 2018-08-20 NOTE — ED Notes (Signed)
Pt returned from CT at this time.  

## 2018-08-20 NOTE — ED Notes (Signed)
Pt in CT.

## 2018-08-20 NOTE — Progress Notes (Signed)
CRITICAL VALUE ALERT  Critical Value:  Lactic Acid 2.0  Date & Time Notied:  08/20/2018, 2112  Provider Notified: Mid Level paged  Orders Received/Actions taken: no new orders at this time

## 2018-08-20 NOTE — ED Triage Notes (Signed)
Pt C/O cough, congestion, nausea, and vomiting that started yesterday morning. Denies fevers at home.

## 2018-08-21 ENCOUNTER — Inpatient Hospital Stay (HOSPITAL_COMMUNITY): Payer: Medicare Other

## 2018-08-21 DIAGNOSIS — J111 Influenza due to unidentified influenza virus with other respiratory manifestations: Secondary | ICD-10-CM

## 2018-08-21 LAB — RESPIRATORY PANEL BY PCR
Adenovirus: NOT DETECTED
Bordetella pertussis: NOT DETECTED
Chlamydophila pneumoniae: NOT DETECTED
Coronavirus 229E: NOT DETECTED
Coronavirus HKU1: NOT DETECTED
Coronavirus NL63: NOT DETECTED
Coronavirus OC43: NOT DETECTED
Influenza A H1 2009: DETECTED — AB
Influenza B: NOT DETECTED
Metapneumovirus: NOT DETECTED
Mycoplasma pneumoniae: NOT DETECTED
Parainfluenza Virus 1: NOT DETECTED
Parainfluenza Virus 2: NOT DETECTED
Parainfluenza Virus 3: NOT DETECTED
Parainfluenza Virus 4: NOT DETECTED
Respiratory Syncytial Virus: NOT DETECTED
Rhinovirus / Enterovirus: NOT DETECTED

## 2018-08-21 LAB — COMPREHENSIVE METABOLIC PANEL
ALK PHOS: 53 U/L (ref 38–126)
ALT: 15 U/L (ref 0–44)
AST: 28 U/L (ref 15–41)
Albumin: 3.8 g/dL (ref 3.5–5.0)
Anion gap: 8 (ref 5–15)
BUN: 9 mg/dL (ref 8–23)
CO2: 26 mmol/L (ref 22–32)
Calcium: 8.6 mg/dL — ABNORMAL LOW (ref 8.9–10.3)
Chloride: 103 mmol/L (ref 98–111)
Creatinine, Ser: 0.72 mg/dL (ref 0.44–1.00)
GFR calc Af Amer: >60 mL/min (ref >60)
GFR calc non Af Amer: >60 mL/min (ref >60)
Glucose, Bld: 111 mg/dL — ABNORMAL HIGH (ref 70–99)
Potassium: 3.9 mmol/L (ref 3.5–5.1)
Sodium: 137 mmol/L (ref 135–145)
Total Bilirubin: 1.6 mg/dL — ABNORMAL HIGH (ref 0.3–1.2)
Total Protein: 7.5 g/dL (ref 6.5–8.1)

## 2018-08-21 LAB — URINALYSIS, ROUTINE W REFLEX MICROSCOPIC
Bacteria, UA: NONE SEEN
Bilirubin Urine: NEGATIVE
Glucose, UA: NEGATIVE mg/dL
Ketones, ur: NEGATIVE mg/dL
Leukocytes,Ua: NEGATIVE
Nitrite: NEGATIVE
PH: 6 (ref 5.0–8.0)
Protein, ur: NEGATIVE mg/dL
Specific Gravity, Urine: 1.024 (ref 1.005–1.030)

## 2018-08-21 LAB — CBC
HCT: 38.2 % (ref 36.0–46.0)
Hemoglobin: 11.8 g/dL — ABNORMAL LOW (ref 12.0–15.0)
MCH: 31.1 pg (ref 26.0–34.0)
MCHC: 30.9 g/dL (ref 30.0–36.0)
MCV: 100.8 fL — AB (ref 80.0–100.0)
Platelets: 131 10*3/uL — ABNORMAL LOW (ref 150–400)
RBC: 3.79 MIL/uL — ABNORMAL LOW (ref 3.87–5.11)
RDW: 14.2 % (ref 11.5–15.5)
WBC: 16 10*3/uL — ABNORMAL HIGH (ref 4.0–10.5)
nRBC: 0 % (ref 0.0–0.2)

## 2018-08-21 LAB — STREP PNEUMONIAE URINARY ANTIGEN: Strep Pneumo Urinary Antigen: NEGATIVE

## 2018-08-21 LAB — LACTIC ACID, PLASMA: Lactic Acid, Venous: 0.7 mmol/L (ref 0.5–1.9)

## 2018-08-21 LAB — MAGNESIUM: MAGNESIUM: 2 mg/dL (ref 1.7–2.4)

## 2018-08-21 MED ORDER — OSELTAMIVIR PHOSPHATE 30 MG PO CAPS
30.0000 mg | ORAL_CAPSULE | Freq: Two times a day (BID) | ORAL | Status: DC
  Administered 2018-08-21 – 2018-08-23 (×4): 30 mg via ORAL
  Filled 2018-08-21 (×5): qty 1

## 2018-08-21 MED ORDER — OSELTAMIVIR PHOSPHATE 75 MG PO CAPS: 75.0000 mg | ORAL_CAPSULE | Freq: Two times a day (BID) | ORAL | Status: DC

## 2018-08-21 MED ORDER — METHYLPREDNISOLONE SODIUM SUCC 125 MG IJ SOLR
60.0000 mg | Freq: Two times a day (BID) | INTRAMUSCULAR | Status: DC
  Administered 2018-08-21 – 2018-08-23 (×4): 60 mg via INTRAVENOUS
  Filled 2018-08-21 (×5): qty 2

## 2018-08-21 NOTE — Progress Notes (Signed)
PHARMACY NOTE:  ANTI-VIRAL RENAL DOSAGE ADJUSTMENT  Current anti-viral regimen includes a mismatch between anti-viral dosage and estimated renal function.  As per policy approved by the Pharmacy & Therapeutics and Medical Executive Committees, the antimicrobial dosage will be adjusted accordingly.  Current anti-viral dosage:  olseltamivir 75mg  bid  Indication:  influenza  Renal Function:  Estimated Creatinine Clearance: 47.3 mL/min (by C-G formula based on SCr of 0.72 mg/dL). []      On intermittent HD, scheduled: []      On CRRT    Anti-viral dosage has been changed to:   olseltamivir 30mg  bid    Thank you for allowing pharmacy to be a part of this patient's care.  Despina Pole, [facility] 08/21/2018 4:39 PM

## 2018-08-21 NOTE — Progress Notes (Signed)
PROGRESS NOTE  Angelica [name] TWS:[id number] DOB: [birth year] DOA: 08/20/2018 PCP: [name], MD  Brief History:  85 y.o. female with medical history of left-sided breast cancer status post mastectomy, hypertension, hyperlipidemia, nonobstructive coronary artery disease, hypothyroidism presented with 1 to 2-day history of coughing, shortness of breath, chest congestion.  The patient began developing some shortness of breath and nonproductive cough on 08/17/2018.  However in the last 24 hours her symptoms have continued to progress with subjective fevers and chills.  She denies any hemoptysis, but complained of some chest discomfort with coughing.  On the morning of admission, the patient had developed nausea and vomiting x3 episodes without any hematemesis.  She had 2 loose bowel movements without any hematochezia or melena.  There is no frank diarrhea.  The patient denies any hemoptysis, dysuria, hematuria.  She denies any recent travels or sick contacts.  She states that her son from [place] recently visited her on 08/16/2018.  He has not traveled outside the country or any place else in the past 30 days except for visiting her here in [place].   Assessment/Plan: Sepsis -Present at the time of admission -Patient presented with elevated lactate,  leukocytosis and tachycardia with hypoxia -Check lactic acid 2.4>>>0.7 -Procalcitonin--0.54 -Continue IV fluids  Lobar pneumonia -08/20/2018 CTA chest negative for PE but showed right middle lobe opacity -Continue ceftriaxone and azithromycin -Urine Legionella antigen--pending -Urine Streptococcus pneumoniae antigen--pending -Influenza PCR negative  Acute bronchospasm -significant wheezing -start solumedrol -start pulmicort  Essential hypertension -Continue metoprolol succinate -Holding enalapril and furosemide  Nausea and vomiting/abdominal pain -08/20/2018 CT abdomen and pelvis--negative for acute  findings -Influenza PCR--neg -Viral respiratory panel+influenza-->start oseltamivir  Hypothyroidism -Continue Synthroid  Coronary artery disease -No chest pain presently -Troponin negative x2 -Continue aspirin -EKG--sinus rhythm, nonspecific T wave changes  Thrombocytopenia -Likely due to sepsis -Serum G01--749 -Folic SWHQ--75.9 -Monitor for signs of bleeding -A.m. CBC  Hyperlipidemia -Continue statin  Lower extremity Edema -venous duplex--neg for DVT  Hypokalemia/hypomagnesemia -repleted    Disposition Plan:   Home in 1-2 days  Family Communication:  No Family at bedside  Consultants:  none  Code Status:  FULL   DVT Prophylaxis:  [place] Lovenox   Procedures: As Listed in Progress Note Above  Antibiotics: Ceftriaxone 3/11>>> Azithromycin 3/11>>>    Subjective: Pt feeling better but c/o wheezing today.  States breathing is a little better.  Continues to have nonproductive cough, and feels weak.  No n/v/d, abd pain. Headache, cp, hemoptysis  Objective: Vitals:   08/20/18 2127 08/20/18 2133 08/20/18 2220 08/21/18 0700  BP:  133/70  (!) 126/50  Pulse:  (!) 104  97  Resp:  20  18  Temp:  99.7 F (37.6 C)  99 F (37.2 C)  TempSrc:  Oral    SpO2: 94% 96% 90% 94%  Weight:      Height:        Intake/Output Summary (Last 24 hours) at 08/21/2018 1205 Last data filed at 08/21/2018 0700 Gross per 24 hour  Intake 1830.42 ml  Output 100 ml  Net 1730.42 ml   Weight change:  Exam:   General:  Pt is alert, follows commands appropriately, not in acute distress  HEENT: No icterus, No thrush, No neck mass, [place]/AT  Cardiovascular: RRR, S1/S2, no rubs, no gallops  Respiratory: bilateral rales. Bilateral expiratory wheeze  Abdomen: Soft/+BS, non tender, non distended, no guarding  Extremities: No edema, No lymphangitis, No petechiae,  No rashes, no synovitis   Data Reviewed: I have personally reviewed following labs and imaging studies Basic  Metabolic Panel: Recent Labs  Lab 08/20/18 0746 08/20/18 1733 08/21/18 0908  NA 137 136 137  K 2.8* 3.9 3.9  CL 97* 99 103  CO2 26 26 26   GLUCOSE 179* 119* 111*  BUN 14 10 9   CREATININE 0.72 0.71 0.72  CALCIUM 9.2 8.6* 8.6*  MG 1.5*  --  2.0   Liver Function Tests: Recent Labs  Lab 08/20/18 0746 08/20/18 1733 08/21/18 0908  AST 23 25 28   ALT 13 15 15   ALKPHOS 59 55 53  BILITOT 2.3* 1.8* 1.6*  PROT 8.7* 8.2* 7.5  ALBUMIN 4.6 4.2 3.8   Recent Labs  Lab 08/20/18 0746  LIPASE 30   No results for input(s): AMMONIA in the last 168 hours. Coagulation Profile: Recent Labs  Lab 08/20/18 1733  INR 1.1   CBC: Recent Labs  Lab 08/20/18 0746 08/20/18 1733 08/21/18 0908  WBC 19.4* 25.6* 16.0*  NEUTROABS 17.2* 22.7*  --   HGB 13.4 13.0 11.8*  HCT 41.3 42.4 38.2  MCV 96.9 99.5 100.8*  PLT 149* 138* 131*   Cardiac Enzymes: Recent Labs  Lab 08/20/18 0746 08/20/18 1202  TROPONINI <0.03 <0.03   BNP: Invalid input(s): POCBNP CBG: No results for input(s): GLUCAP in the last 168 hours. HbA1C: Recent Labs    08/20/18 1733  HGBA1C 5.5   Urine analysis:    Component Value Date/Time   COLORURINE YELLOW 08/21/2018 0500   APPEARANCEUR CLEAR 08/21/2018 0500   LABSPEC 1.024 08/21/2018 0500   PHURINE 6.0 08/21/2018 0500   GLUCOSEU NEGATIVE 08/21/2018 0500   HGBUR SMALL (A) 08/21/2018 0500   BILIRUBINUR NEGATIVE 08/21/2018 0500   KETONESUR NEGATIVE 08/21/2018 0500   PROTEINUR NEGATIVE 08/21/2018 0500   NITRITE NEGATIVE 08/21/2018 0500   LEUKOCYTESUR NEGATIVE 08/21/2018 0500   Sepsis Labs: @LABRCNTIP (procalcitonin:4,lacticidven:4) ) Recent Results (from the past 240 hour(s))  Culture, blood (x 2)     Status: None (Preliminary result)   Collection Time: 08/20/18  5:35 PM  Result Value Ref Range Status   Specimen Description BLOOD RIGHT HAND  Final   Special Requests   Final    BOTTLES DRAWN AEROBIC AND ANAEROBIC Blood Culture adequate volume   Culture    Final    NO GROWTH < 24 HOURS Performed at [facility], [address], [place], [place] [address]    Report Status PENDING  Incomplete  Culture, blood (x 2)     Status: None (Preliminary result)   Collection Time: 08/20/18  5:38 PM  Result Value Ref Range Status   Specimen Description RIGHT ANTECUBITAL  Final   Special Requests   Final    BOTTLES DRAWN AEROBIC AND ANAEROBIC Blood Culture adequate volume   Culture   Final    NO GROWTH < 24 HOURS Performed at [facility], [address] [place], [place] [address]    Report Status PENDING  Incomplete     Scheduled Meds: . allopurinol  300 mg Oral Daily  . amLODipine  5 mg Oral Daily  . aspirin EC  81 mg Oral Daily  . atorvastatin  20 mg Oral q1800  . enoxaparin (LOVENOX) injection  40 mg Subcutaneous Q24H  . levothyroxine  50 mcg Oral Daily  . metoprolol succinate  50 mg Oral Daily  . multivitamin with minerals  1 tablet Oral Daily  . pantoprazole  40 mg Oral Daily  . polyvinyl alcohol  1 drop Both Eyes BID  . potassium chloride SA  20 mEq Oral Daily   Continuous Infusions: . sodium chloride 100 mL/hr at 08/20/18 1605  . 0.9 % NaCl with KCl 20 mEq / L 75 mL/hr at 08/20/18 1742  . azithromycin Stopped (08/20/18 1548)  . azithromycin 500 mg (08/21/18 1111)  . cefTRIAXone (ROCEPHIN)  IV      Procedures/Studies: Dg Chest 2 View  Result Date: 08/20/2018 CLINICAL DATA:  Cough, fever EXAM: CHEST - 2 VIEW COMPARISON:  12/27/2013 FINDINGS: Cardiomegaly. Both lungs are clear. Disc degenerative disease of the thoracic spine. IMPRESSION: Cardiomegaly without acute abnormality of the lungs. No focal airspace opacity. Electronically Signed   By: Eddie Candle M.D.   On: 08/20/2018 08:41   Ct Angio Chest Pe W And/or Wo Contrast  Result Date: 08/20/2018 CLINICAL DATA:  Chest pain and shortness of breath EXAM: CT ANGIOGRAPHY CHEST WITH CONTRAST TECHNIQUE: Multidetector CT imaging of the chest was performed using the standard protocol  during bolus administration of intravenous contrast. Multiplanar CT image reconstructions and MIPs were obtained to evaluate the vascular anatomy. CONTRAST:  65mL OMNIPAQUE IOHEXOL 350 MG/ML SOLN COMPARISON:  Chest radiograph August 20, 2018. FINDINGS: Cardiovascular: There is no demonstrable pulmonary embolus. There is no appreciable thoracic aortic aneurysm or dissection. The visualized great vessels appear unremarkable. Note that the right innominate and left common carotid arteries arise as a common trunk, an anatomic variant. There is no appreciable pericardial effusion or pericardial thickening. There are foci of coronary artery calcification at several sites. The main pulmonary outflow tract measures 3.3 cm, prominent. Mediastinum/Nodes: Thyroid appears unremarkable. There is no appreciable thoracic adenopathy. There is a small hiatal hernia. Lungs/Pleura: There is atelectasis in the right base. There is probable focus of pneumonia as well in the lateral segment right middle lobe. There is milder atelectatic change in the left base. There is also slight atelectasis in the right upper lobe. There is no appreciable pleural effusion or pleural thickening. Upper Abdomen: Visualized upper abdominal structures appear unremarkable. Musculoskeletal: There are no blastic or lytic bone lesions. No evident chest wall lesions. Review of the MIP images confirms the above findings. IMPRESSION: 1. No demonstrable pulmonary embolus. No thoracic aortic aneurysm or dissection. There are foci of coronary artery calcification. 2. Small area of apparent pneumonia in the lateral segment right middle lobe. There is lower lobe and right upper lobe patchy atelectasis. 3. Prominence of the main pulmonary outflow tract, a finding felt to be indicative of a degree of pulmonary arterial hypertension. 4.  Small hiatal hernia. 5.  No demonstrable thoracic adenopathy. Electronically Signed   By: Lowella Grip III M.D.   On: 08/20/2018  13:26   Ct Abdomen Pelvis W Contrast  Result Date: 08/20/2018 CLINICAL DATA:  Acute generalized abdominal pain EXAM: CT ABDOMEN AND PELVIS WITH CONTRAST TECHNIQUE: Multidetector CT imaging of the abdomen and pelvis was performed using the standard protocol following bolus administration of intravenous contrast. CONTRAST:  187mL OMNIPAQUE IOHEXOL 300 MG/ML  SOLN COMPARISON:  03/19/2011 FINDINGS: Lower chest: Extensive coronary atherosclerosis. Chronically elevated right diaphragm with mild overlying atelectasis or scar. Hepatobiliary: Hepatic steatosis.Cholecystectomy with normal common bile duct diameter Pancreas: Unremarkable. Spleen: Unremarkable. Adrenals/Urinary Tract: Negative adrenals. No hydronephrosis or ureteral stone. Limited for detecting renal calculi due to excreted contrast. Small left renal cysts. Unremarkable bladder. Stomach/Bowel: No obstruction. Appendectomy. Changes of internal hemorrhoid surgery. Vascular/Lymphatic: Atherosclerosis.  No mass or adenopathy. Reproductive:Hysterectomy. Other: No ascites or pneumoperitoneum.  Midline hernia repair Musculoskeletal:  Advanced lumbar spine degeneration with mild dextroscoliosis. IMPRESSION: 1. No acute finding. 2. Atherosclerosis, colonic diverticulosis, and hepatic steatosis. Electronically Signed   By: Monte Fantasia M.D.   On: 08/20/2018 11:18    Orson Lyberti, DO  [facility] Pager [phone]  If 7PM-7AM, please contact night-coverage [email or web address] Password [email or web address] 08/21/2018, 12:05 PM   LOS: 1 day

## 2018-08-22 LAB — LEGIONELLA PNEUMOPHILA SEROGP 1 UR AG: L. pneumophila Serogp 1 Ur Ag: NEGATIVE

## 2018-08-22 MED ORDER — IPRATROPIUM-ALBUTEROL 0.5-2.5 (3) MG/3ML IN SOLN
3.0000 mL | Freq: Four times a day (QID) | RESPIRATORY_TRACT | Status: DC
  Administered 2018-08-22 – 2018-08-23 (×3): 3 mL via RESPIRATORY_TRACT
  Filled 2018-08-22 (×4): qty 3

## 2018-08-22 MED ORDER — AZITHROMYCIN 250 MG PO TABS
500.0000 mg | ORAL_TABLET | Freq: Every day | ORAL | Status: DC
  Administered 2018-08-23: 500 mg via ORAL
  Filled 2018-08-22: qty 2

## 2018-08-22 MED ORDER — BUDESONIDE 0.5 MG/2ML IN SUSP
0.5000 mg | Freq: Two times a day (BID) | RESPIRATORY_TRACT | Status: DC
  Administered 2018-08-22 – 2018-08-23 (×2): 0.5 mg via RESPIRATORY_TRACT
  Filled 2018-08-22 (×2): qty 2

## 2018-08-22 NOTE — Progress Notes (Signed)
PROGRESS NOTE  Angelica [name] HYW:[id number] DOB: 05/25/1933 DOA: 08/20/2018 PCP: Angelica School, MD  Brief History:  85 y.o.femalewith medical history ofleft-sided breast cancer status post mastectomy, hypertension, hyperlipidemia, nonobstructive coronary artery disease, hypothyroidism presented with 1 to 2-day history of coughing, shortness of breath, chest congestion. The patient began developing some shortness of breath and nonproductive cough on 08/17/2018. However in the last 24 hours her symptoms have continued to progress with subjective fevers and chills. She denies any hemoptysis, but complained of some chest discomfort with coughing. On the morning of admission, the patient had developed nausea and vomiting x3 episodes without any hematemesis. She had 2 loose bowel movements without any hematochezia or melena. There is no frank diarrhea. The patient denies any hemoptysis, dysuria, hematuria. She denies any recent travels or sick contacts. She states that her son from [place] recently visited her on 08/16/2018. He has not traveled outside the country or any place else in the past 30 days except for visiting her here in [place].   Assessment/Plan: Sepsis -Present at the time of admission -Patient presented with elevated lactate,  leukocytosis and tachycardia with hypoxia -Check lactic acid 2.4>>>0.7 -Procalcitonin--0.54 -Continue IV fluids  Lobar pneumonia -08/20/2018 CTA chest negative for PE but showed right middle lobe opacity -Continue ceftriaxone and azithromycin -Urine Legionella antigen--neg -Urine Streptococcus pneumoniae antigen--neg -Influenza PCR negative  Acute bronchospasm -significant wheezing persists -continue solumedrol -start pulmicort -start duo nebs  Essential hypertension -Continue metoprolol succinate -Holding enalapril and furosemide  Nausea and vomiting/abdominal pain -08/20/2018 CT abdomen and  pelvis--negative for acute findings -Influenza PCR--neg -Viral respiratory panel+influenza-->start oseltamivir  Hypothyroidism -Continue Synthroid  Coronary artery disease -No chest pain presently -Troponin negative x2 -Continue aspirin -EKG--sinus rhythm, nonspecific T wave changes  Thrombocytopenia -Likely due to sepsis -Serum S85--462 -Folic VOJJ--00.9 -Monitor for signs of bleeding -A.m. CBC  Hyperlipidemia -Continue statin  Lower extremity Edema -venous duplex--neg for DVT  Hypokalemia/hypomagnesemia -repleted    Disposition Plan:   Home 3/14 or 3/15 if stable Family Communication:  spouse updated at bedside 3/13  Consultants:  none  Code Status:  FULL   DVT Prophylaxis:  [place] Lovenox   Procedures: As Listed in Progress Note Above  Antibiotics: Ceftriaxone 3/11>>> Azithromycin 3/11>>>       Subjective: Pt still has sob with mild exertion.  She continues to have cough with yellow sputum without hemoptysis.  No n/v/d, abd pain.  No f/c/ headache  Objective: Vitals:   08/21/18 1400 08/21/18 2356 08/22/18 0514 08/22/18 1425  BP: 122/69 122/66 (!) 142/69 121/70  Pulse: 97 78 68 79  Resp: 18 18 18 18   Temp: 98.6 F (37 C) 98.2 F (36.8 C) 98.1 F (36.7 C) 98.9 F (37.2 C)  TempSrc:  Oral Oral Oral  SpO2: 98% 97% 100% 99%  Weight:      Height:        Intake/Output Summary (Last 24 hours) at 08/22/2018 1633 Last data filed at 08/22/2018 1041 Gross per 24 hour  Intake 830 ml  Output --  Net 830 ml   Weight change:  Exam:   General:  Pt is alert, follows commands appropriately, not in acute distress  HEENT: No icterus, No thrush, No neck mass, [place]/AT  Cardiovascular: RRR, S1/S2, no rubs, no gallops  Respiratory: bibasilar wheezing.  Scattered bilateral rales.  Abdomen: Soft/+BS, non tender, non distended, no guarding  Extremities: No edema, No lymphangitis, No petechiae, No rashes, no  synovitis   Data Reviewed: I  have personally reviewed following labs and imaging studies Basic Metabolic Panel: Recent Labs  Lab 08/20/18 0746 08/20/18 1733 08/21/18 0908  NA 137 136 137  K 2.8* 3.9 3.9  CL 97* 99 103  CO2 26 26 26   GLUCOSE 179* 119* 111*  BUN 14 10 9   CREATININE 0.72 0.71 0.72  CALCIUM 9.2 8.6* 8.6*  MG 1.5*  --  2.0   Liver Function Tests: Recent Labs  Lab 08/20/18 0746 08/20/18 1733 08/21/18 0908  AST 23 25 28   ALT 13 15 15   ALKPHOS 59 55 53  BILITOT 2.3* 1.8* 1.6*  PROT 8.7* 8.2* 7.5  ALBUMIN 4.6 4.2 3.8   Recent Labs  Lab 08/20/18 0746  LIPASE 30   No results for input(s): AMMONIA in the last 168 hours. Coagulation Profile: Recent Labs  Lab 08/20/18 1733  INR 1.1   CBC: Recent Labs  Lab 08/20/18 0746 08/20/18 1733 08/21/18 0908  WBC 19.4* 25.6* 16.0*  NEUTROABS 17.2* 22.7*  --   HGB 13.4 13.0 11.8*  HCT 41.3 42.4 38.2  MCV 96.9 99.5 100.8*  PLT 149* 138* 131*   Cardiac Enzymes: Recent Labs  Lab 08/20/18 0746 08/20/18 1202  TROPONINI <0.03 <0.03   BNP: Invalid input(s): POCBNP CBG: No results for input(s): GLUCAP in the last 168 hours. HbA1C: Recent Labs    08/20/18 1733  HGBA1C 5.5   Urine analysis:    Component Value Date/Time   COLORURINE YELLOW 08/21/2018 0500   APPEARANCEUR CLEAR 08/21/2018 0500   LABSPEC 1.024 08/21/2018 0500   PHURINE 6.0 08/21/2018 0500   GLUCOSEU NEGATIVE 08/21/2018 0500   HGBUR SMALL (A) 08/21/2018 0500   BILIRUBINUR NEGATIVE 08/21/2018 0500   KETONESUR NEGATIVE 08/21/2018 0500   PROTEINUR NEGATIVE 08/21/2018 0500   NITRITE NEGATIVE 08/21/2018 0500   LEUKOCYTESUR NEGATIVE 08/21/2018 0500   Sepsis Labs: @LABRCNTIP (procalcitonin:4,lacticidven:4) ) Recent Results (from the past 240 hour(s))  Culture, blood (x 2)     Status: None (Preliminary result)   Collection Time: 08/20/18  5:35 PM  Result Value Ref Range Status   Specimen Description BLOOD RIGHT HAND  Final   Special Requests   Final    BOTTLES DRAWN  AEROBIC AND ANAEROBIC Blood Culture adequate volume   Culture   Final    NO GROWTH 2 DAYS Performed at [facility], [address] [place], [place] [address]    Report Status PENDING  Incomplete  Culture, blood (x 2)     Status: None (Preliminary result)   Collection Time: 08/20/18  5:38 PM  Result Value Ref Range Status   Specimen Description RIGHT ANTECUBITAL  Final   Special Requests   Final    BOTTLES DRAWN AEROBIC AND ANAEROBIC Blood Culture adequate volume   Culture   Final    NO GROWTH 2 DAYS Performed at [facility], [address] [place], [place] [address]    Report Status PENDING  Incomplete  Respiratory Panel by PCR     Status: Abnormal   Collection Time: 08/21/18 12:56 AM  Result Value Ref Range Status   Adenovirus NOT DETECTED NOT DETECTED Final   Coronavirus 229E NOT DETECTED NOT DETECTED Final    Comment: (NOTE) The Coronavirus on the Respiratory Panel, DOES NOT test for the novel  Coronavirus (2019 nCoV)    Coronavirus HKU1 NOT DETECTED NOT DETECTED Final   Coronavirus NL63 NOT DETECTED NOT DETECTED Final   Coronavirus OC43 NOT DETECTED NOT DETECTED Final   Metapneumovirus  NOT DETECTED NOT DETECTED Final   Rhinovirus / Enterovirus NOT DETECTED NOT DETECTED Final   Influenza A H1 2009 DETECTED (A) NOT DETECTED Final   Influenza B NOT DETECTED NOT DETECTED Final   Parainfluenza Virus 1 NOT DETECTED NOT DETECTED Final   Parainfluenza Virus 2 NOT DETECTED NOT DETECTED Final   Parainfluenza Virus 3 NOT DETECTED NOT DETECTED Final   Parainfluenza Virus 4 NOT DETECTED NOT DETECTED Final   Respiratory Syncytial Virus NOT DETECTED NOT DETECTED Final   Bordetella pertussis NOT DETECTED NOT DETECTED Final   Chlamydophila pneumoniae NOT DETECTED NOT DETECTED Final   Mycoplasma pneumoniae NOT DETECTED NOT DETECTED Final    Comment: Performed at [facility], [place] [address], [place], [place] [address]     Scheduled Meds:  allopurinol  300 mg Oral Daily    amLODipine  5 mg Oral Daily   aspirin EC  81 mg Oral Daily   atorvastatin  20 mg Oral q1800   budesonide (PULMICORT) nebulizer solution  0.5 mg Nebulization BID   enoxaparin (LOVENOX) injection  40 mg Subcutaneous Q24H   levothyroxine  50 mcg Oral Daily   methylPREDNISolone (SOLU-MEDROL) injection  60 mg Intravenous Q12H   metoprolol succinate  50 mg Oral Daily   multivitamin with minerals  1 tablet Oral Daily   oseltamivir  30 mg Oral BID   pantoprazole  40 mg Oral Daily   polyvinyl alcohol  1 drop Both Eyes BID   potassium chloride SA  20 mEq Oral Daily   Continuous Infusions:  sodium chloride 100 mL/hr at 08/20/18 1605   0.9 % NaCl with KCl 20 mEq / L 75 mL/hr at 08/22/18 0402   azithromycin 500 mg (08/22/18 1041)   cefTRIAXone (ROCEPHIN)  IV 2 g (08/22/18 1430)    Procedures/Studies: Dg Chest 2 View  Result Date: 08/20/2018 CLINICAL DATA:  Cough, fever EXAM: CHEST - 2 VIEW COMPARISON:  12/27/2013 FINDINGS: Cardiomegaly. Both lungs are clear. Disc degenerative disease of the thoracic spine. IMPRESSION: Cardiomegaly without acute abnormality of the lungs. No focal airspace opacity. Electronically Signed   By: Eddie Candle M.D.   On: 08/20/2018 08:41   Ct Angio Chest Pe W And/or Wo Contrast  Result Date: 08/20/2018 CLINICAL DATA:  Chest pain and shortness of breath EXAM: CT ANGIOGRAPHY CHEST WITH CONTRAST TECHNIQUE: Multidetector CT imaging of the chest was performed using the standard protocol during bolus administration of intravenous contrast. Multiplanar CT image reconstructions and MIPs were obtained to evaluate the vascular anatomy. CONTRAST:  80mL OMNIPAQUE IOHEXOL 350 MG/ML SOLN COMPARISON:  Chest radiograph August 20, 2018. FINDINGS: Cardiovascular: There is no demonstrable pulmonary embolus. There is no appreciable thoracic aortic aneurysm or dissection. The visualized great vessels appear unremarkable. Note that the right innominate and left common carotid  arteries arise as a common trunk, an anatomic variant. There is no appreciable pericardial effusion or pericardial thickening. There are foci of coronary artery calcification at several sites. The main pulmonary outflow tract measures 3.3 cm, prominent. Mediastinum/Nodes: Thyroid appears unremarkable. There is no appreciable thoracic adenopathy. There is a small hiatal hernia. Lungs/Pleura: There is atelectasis in the right base. There is probable focus of pneumonia as well in the lateral segment right middle lobe. There is milder atelectatic change in the left base. There is also slight atelectasis in the right upper lobe. There is no appreciable pleural effusion or pleural thickening. Upper Abdomen: Visualized upper abdominal structures appear unremarkable. Musculoskeletal: There are no blastic or lytic bone  lesions. No evident chest wall lesions. Review of the MIP images confirms the above findings. IMPRESSION: 1. No demonstrable pulmonary embolus. No thoracic aortic aneurysm or dissection. There are foci of coronary artery calcification. 2. Small area of apparent pneumonia in the lateral segment right middle lobe. There is lower lobe and right upper lobe patchy atelectasis. 3. Prominence of the main pulmonary outflow tract, a finding felt to be indicative of a degree of pulmonary arterial hypertension. 4.  Small hiatal hernia. 5.  No demonstrable thoracic adenopathy. Electronically Signed   By: Lowella Grip III M.D.   On: 08/20/2018 13:26   Ct Abdomen Pelvis W Contrast  Result Date: 08/20/2018 CLINICAL DATA:  Acute generalized abdominal pain EXAM: CT ABDOMEN AND PELVIS WITH CONTRAST TECHNIQUE: Multidetector CT imaging of the abdomen and pelvis was performed using the standard protocol following bolus administration of intravenous contrast. CONTRAST:  189mL OMNIPAQUE IOHEXOL 300 MG/ML  SOLN COMPARISON:  03/19/2011 FINDINGS: Lower chest: Extensive coronary atherosclerosis. Chronically elevated right  diaphragm with mild overlying atelectasis or scar. Hepatobiliary: Hepatic steatosis.Cholecystectomy with normal common bile duct diameter Pancreas: Unremarkable. Spleen: Unremarkable. Adrenals/Urinary Tract: Negative adrenals. No hydronephrosis or ureteral stone. Limited for detecting renal calculi due to excreted contrast. Small left renal cysts. Unremarkable bladder. Stomach/Bowel: No obstruction. Appendectomy. Changes of internal hemorrhoid surgery. Vascular/Lymphatic: Atherosclerosis.  No mass or adenopathy. Reproductive:Hysterectomy. Other: No ascites or pneumoperitoneum.  Midline hernia repair Musculoskeletal: Advanced lumbar spine degeneration with mild dextroscoliosis. IMPRESSION: 1. No acute finding. 2. Atherosclerosis, colonic diverticulosis, and hepatic steatosis. Electronically Signed   By: Monte Fantasia M.D.   On: 08/20/2018 11:18   US Venous Img Lower Bilateral  Result Date: 08/21/2018 CLINICAL DATA:  Bilateral lower extremity swelling and pain EXAM: BILATERAL LOWER EXTREMITY VENOUS DOPPLER ULTRASOUND TECHNIQUE: Gray-scale sonography with graded compression, as well as color Doppler and duplex ultrasound were performed to evaluate the lower extremity deep venous systems from the level of the common femoral vein and including the common femoral, femoral, profunda femoral, popliteal and calf veins including the posterior tibial, peroneal and gastrocnemius veins when visible. The superficial great saphenous vein was also interrogated. Spectral Doppler was utilized to evaluate flow at rest and with distal augmentation maneuvers in the common femoral, femoral and popliteal veins. COMPARISON:  None. FINDINGS: RIGHT LOWER EXTREMITY Common Femoral Vein: No evidence of thrombus. Normal compressibility, respiratory phasicity and response to augmentation. Saphenofemoral Junction: No evidence of thrombus. Normal compressibility and flow on color Doppler imaging. Profunda Femoral Vein: No evidence of  thrombus. Normal compressibility and flow on color Doppler imaging. Femoral Vein: No evidence of thrombus. Normal compressibility, respiratory phasicity and response to augmentation. Popliteal Vein: No evidence of thrombus. Normal compressibility, respiratory phasicity and response to augmentation. Calf Veins: No evidence of thrombus. Normal compressibility and flow on color Doppler imaging. Superficial Great Saphenous Vein: No evidence of thrombus. Normal compressibility. Venous Reflux:  Not assessed Other Findings:  None. LEFT LOWER EXTREMITY Common Femoral Vein: No evidence of thrombus. Normal compressibility, respiratory phasicity and response to augmentation. Saphenofemoral Junction: No evidence of thrombus. Normal compressibility and flow on color Doppler imaging. Profunda Femoral Vein: No evidence of thrombus. Normal compressibility and flow on color Doppler imaging. Femoral Vein: No evidence of thrombus. Normal compressibility, respiratory phasicity and response to augmentation. Popliteal Vein: No evidence of thrombus. Normal compressibility, respiratory phasicity and response to augmentation. Calf Veins: No evidence of thrombus. Normal compressibility and flow on color Doppler imaging. Superficial Great Saphenous Vein: No evidence of thrombus. Normal compressibility. Venous  Reflux:  Not assessed Other Findings:  None. IMPRESSION: No evidence of deep venous thrombosis in either lower extremity. Electronically Signed   By: Jerilynn Mages.  Shick M.D.   On: 08/21/2018 13:12    Orson Rosy, DO  [facility] Pager [phone]  If 7PM-7AM, please contact night-coverage [email or web address] Password [facility] 08/22/2018, 4:33 PM   LOS: 2 days

## 2018-08-22 NOTE — Care Management Important Message (Deleted)
Important Message  Patient Details  Name: Angelica [name] MRN: [id number] Date of Birth: 1932-07-15   Medicare Important Message Given:  Yes    Tommy Medal 08/22/2018, 2:59 PM

## 2018-08-22 NOTE — Care Management Important Message (Signed)
Important Message  Patient Details  Name: Angelica [name] MRN: [id number] Date of Birth: 1932/11/15   Medicare Important Message Given:  Yes    Sherald Barge, RN 08/22/2018, 2:00 PM

## 2018-08-23 LAB — CBC
HCT: 38.2 % (ref 36.0–46.0)
Hemoglobin: 12.1 g/dL (ref 12.0–15.0)
MCH: 31.4 pg (ref 26.0–34.0)
MCHC: 31.7 g/dL (ref 30.0–36.0)
MCV: 99.2 fL (ref 80.0–100.0)
Platelets: 151 10*3/uL (ref 150–400)
RBC: 3.85 MIL/uL — ABNORMAL LOW (ref 3.87–5.11)
RDW: 13.6 % (ref 11.5–15.5)
WBC: 10.1 10*3/uL (ref 4.0–10.5)
nRBC: 0 % (ref 0.0–0.2)

## 2018-08-23 LAB — BASIC METABOLIC PANEL
Anion gap: 12 (ref 5–15)
BUN: 15 mg/dL (ref 8–23)
CO2: 26 mmol/L (ref 22–32)
Calcium: 9.1 mg/dL (ref 8.9–10.3)
Chloride: 97 mmol/L — ABNORMAL LOW (ref 98–111)
Creatinine, Ser: 0.78 mg/dL (ref 0.44–1.00)
GFR calc Af Amer: >60 mL/min (ref >60)
GFR calc non Af Amer: >60 mL/min (ref >60)
Glucose, Bld: 183 mg/dL — ABNORMAL HIGH (ref 70–99)
Potassium: 4.3 mmol/L (ref 3.5–5.1)
Sodium: 135 mmol/L (ref 135–145)

## 2018-08-23 MED ORDER — OSELTAMIVIR PHOSPHATE 30 MG PO CAPS: 30.0000 mg | ORAL_CAPSULE | Freq: Two times a day (BID) | ORAL | 0 refills | Status: DC

## 2018-08-23 MED ORDER — AZITHROMYCIN 250 MG PO TABS: ORAL_TABLET | ORAL | 0 refills | Status: DC

## 2018-08-23 MED ORDER — ALBUTEROL SULFATE HFA 108 (90 BASE) MCG/ACT IN AERS: 2.0000 | INHALATION_SPRAY | Freq: Four times a day (QID) | RESPIRATORY_TRACT | 2 refills | Status: DC | PRN

## 2018-08-23 MED ORDER — PREDNISONE 20 MG PO TABS: 20.0000 mg | ORAL_TABLET | Freq: Every day | ORAL | 0 refills | Status: AC

## 2018-08-23 NOTE — Discharge Summary (Addendum)
Discharge Summary  Angelica [name] VQQ:[id number] DOB: 01/26/1933  PCP: [name], MD  Admit date: 08/20/2018 Discharge date: 08/23/2018  Time spent: 30 minutes  Recommendations for Outpatient Follow-up:  1. Primary care provider   Discharge Diagnoses:  Active Hospital Problems   Diagnosis Date Noted   Influenza with respiratory manifestation 08/21/2018   Sepsis due to undetermined organism ([place]) 08/20/2018   Essential hypertension 02/06/2015   Coronary artery disease, non-occlusive    Hypokalemia 10/22/2011   Hyperlipidemia 10/22/2011    Resolved Hospital Problems  No resolved problems to display.    Discharge Condition: Proved  Diet recommendation: Regular  Vitals:   08/23/18 1439 08/23/18 1456  BP:  (!) 142/68  Pulse:  70  Resp:  16  Temp:  98.2 F (36.8 C)  SpO2: 98% 99%    History of present illness:  Angelica [name] is a 85 y.o. female with medical history of left-sided breast cancer status post mastectomy, hypertension, hyperlipidemia, nonobstructive coronary artery disease, hypothyroidism presented with 1 to 2-day history of coughing, shortness of breath, chest congestion.  The patient began developing some shortness of breath and nonproductive cough on 08/17/2018.  However in the last 24 hours her symptoms have continued to progress with subjective fevers and chills.  She denies any hemoptysis, but complained of some chest discomfort with coughing.  On the morning of admission, the patient had developed nausea and vomiting x3 episodes without any hematemesis.  She had 2 loose bowel movements without any hematochezia or melena.  There is no frank diarrhea.  The patient denies any hemoptysis, dysuria, hematuria.  She denies any recent travels or sick contacts.  She states that her son from [place] recently visited her on 08/16/2018.  He has not traveled outside the country or any place else in the past 30 days except for visiting her here in [place].    Hospital Course:  Principal Problem:   Influenza with respiratory manifestation Active Problems:   Hypokalemia   Hyperlipidemia   Coronary artery disease, non-occlusive   Essential hypertension   Sepsis due to undetermined organism [facility]) 85 year old female with history of left-sided breast cancer status post mastectomy hypertension hyperlipidemia's nonobstructive coronary artery disease who was admitted for possible pneumonia.  She was started on IV antibiotics.  Her chest x-ray did not show any pneumonia but she was treated because she exhibited some respiratory pulmonary congestion.  I have advised them that there was no pneumonia.  Likely bronchitis.  She was started on oral antibiotics to complete her antibiotic regimen at home.  She had required oxygen but that has really versed she had a visit from her son who lives in [place].  She was not tested for coronavirus 19.  But her chest x-ray were normal so is unlikely that she has coronavirus covi 19.  She does have influenza A and was treated with Tamiflu she will continue her Tamiflu for 4 more days to complete  5-day treatment  Procedures:  None  Consultations:  None  Discharge Exam: BP (!) 142/68 (BP Location: Right Arm)    Pulse 70    Temp 98.2 F (36.8 C) (Oral)    Resp 16    Ht 5' (1.524 m)    Wt 74.7 kg    SpO2 99%    BMI 32.16 kg/m   General: Alert and oriented x3 no distress Cardiovascular: Regular rate and rhythm with no murmur Respiratory: Has some mild rhonchi and wheezing on the left  side  Discharge Instructions You were cared for by a hospitalist during your hospital stay. If you have any questions about your discharge medications or the care you received while you were in the hospital after you are discharged, you can call the unit and asked to speak with the hospitalist on call if the hospitalist that took care of you is not available. Once you are discharged, your primary care physician will  handle any further medical issues. Please note that NO REFILLS for any discharge medications will be authorized once you are discharged, as it is imperative that you return to your primary care physician (or establish a relationship with a primary care physician if you do not have one) for your aftercare needs so that they can reassess your need for medications and monitor your lab values.  Discharge Instructions    Call MD for:  difficulty breathing, headache or visual disturbances   Complete by:  As directed    Call MD for:  persistant nausea and vomiting   Complete by:  As directed    Call MD for:  temperature >100.4   Complete by:  As directed    Diet - low sodium heart healthy   Complete by:  As directed    Discharge instructions   Complete by:  As directed    F/u pcp in 1week   Increase activity slowly   Complete by:  As directed      Allergies as of 08/23/2018      Reactions   Bee Venom Anaphylaxis   Ciprofloxacin Nausea And Vomiting   Clindamycin Hcl    Does not know   Flagyl [metronidazole Hcl] Nausea And Vomiting   Shellfish-derived Products    Rash developed so patient has steered clear of any since last year   Sulfa Antibiotics Nausea Only      Medication List    TAKE these medications   albuterol 108 (90 Base) MCG/ACT inhaler Commonly known as:  PROVENTIL HFA;VENTOLIN HFA Inhale 2 puffs into the lungs every 6 (six) hours as needed for wheezing or shortness of breath.   allopurinol 300 MG tablet Commonly known as:  ZYLOPRIM Take 300 mg by mouth daily.   alum & mag hydroxide-simeth 200-200-20 MG/5ML suspension Commonly known as:  MAALOX/MYLANTA Take 30 mLs by mouth every 6 (six) hours as needed for indigestion or heartburn (dyspepsia).   amLODipine 5 MG tablet Commonly known as:  NORVASC TAKE (1) TABLET BY MOUTH ONCE DAILY.   aspirin EC 81 MG tablet Take 81 mg by mouth daily.   atorvastatin 20 MG tablet Commonly known as:  LIPITOR TAKE (1) TABLET BY  MOUTH AT BEDTIME FOR CHOLESTEROL.   azithromycin 250 MG tablet Commonly known as:  ZITHROMAX Take 2 tablet daily start 08/24/2018 Start taking on:  August 24, 2018   enalapril 10 MG tablet Commonly known as:  VASOTEC Take 10 mg by mouth daily.   furosemide 20 MG tablet Commonly known as:  LASIX Take 20 mg by mouth daily.   levothyroxine 50 MCG tablet Commonly known as:  SYNTHROID, LEVOTHROID Take 1 tablet (50 mcg total) by mouth daily.   metoprolol succinate 50 MG 24 hr tablet Commonly known as:  TOPROL-XL Take 50 mg by mouth daily. Take with or immediately following a meal.   multivitamins ther. w/minerals Tabs tablet Take 1 tablet by mouth daily.   Nitrostat 0.4 MG SL tablet Generic drug:  nitroGLYCERIN DISSOLVE 1 TABLET UNDER TONGUE EVERY 5 MINUTES AS NEEDED FOR CHEST  PAIN.   oseltamivir 30 MG capsule Commonly known as:  TAMIFLU Take 1 capsule (30 mg total) by mouth 2 (two) times daily. Start this evening for a total of 5 days   pantoprazole 40 MG tablet Commonly known as:  PROTONIX TAKE ONE TABLET BY MOUTH DAILY.   potassium chloride SA 20 MEQ tablet Commonly known as:  K-DUR,KLOR-CON Take 1 tablet (20 mEq total) by mouth daily.   predniSONE 20 MG tablet Commonly known as:  Deltasone Take 1 tablet (20 mg total) by mouth daily.   Systane Balance 0.6 % Soln Generic drug:  Propylene Glycol Place 1 drop into both eyes 2 (two) times daily.      Allergies  Allergen Reactions   Bee Venom Anaphylaxis   Ciprofloxacin Nausea And Vomiting   Clindamycin Hcl     Does not know   Flagyl [Metronidazole Hcl] Nausea And Vomiting   Shellfish-Derived Products     Rash developed so patient has steered clear of any since last year   Sulfa Antibiotics Nausea Only   Follow-up Information    [name], MD On 08/18/2018.   Specialty:  Internal Medicine Why:  at 8:45 am Contact information: [address] [place] [address] [phone]             The results of significant diagnostics from this hospitalization (including imaging, microbiology, ancillary and laboratory) are listed below for reference.    Significant Diagnostic Studies: Dg Chest 2 View  Result Date: 08/20/2018 CLINICAL DATA:  Cough, fever EXAM: CHEST - 2 VIEW COMPARISON:  12/27/2013 FINDINGS: Cardiomegaly. Both lungs are clear. Disc degenerative disease of the thoracic spine. IMPRESSION: Cardiomegaly without acute abnormality of the lungs. No focal airspace opacity. Electronically Signed   By: Eddie Candle M.D.   On: 08/20/2018 08:41   Ct Angio Chest Pe W And/or Wo Contrast  Result Date: 08/20/2018 CLINICAL DATA:  Chest pain and shortness of breath EXAM: CT ANGIOGRAPHY CHEST WITH CONTRAST TECHNIQUE: Multidetector CT imaging of the chest was performed using the standard protocol during bolus administration of intravenous contrast. Multiplanar CT image reconstructions and MIPs were obtained to evaluate the vascular anatomy. CONTRAST:  79mL OMNIPAQUE IOHEXOL 350 MG/ML SOLN COMPARISON:  Chest radiograph August 20, 2018. FINDINGS: Cardiovascular: There is no demonstrable pulmonary embolus. There is no appreciable thoracic aortic aneurysm or dissection. The visualized great vessels appear unremarkable. Note that the right innominate and left common carotid arteries arise as a common trunk, an anatomic variant. There is no appreciable pericardial effusion or pericardial thickening. There are foci of coronary artery calcification at several sites. The main pulmonary outflow tract measures 3.3 cm, prominent. Mediastinum/Nodes: Thyroid appears unremarkable. There is no appreciable thoracic adenopathy. There is a small hiatal hernia. Lungs/Pleura: There is atelectasis in the right base. There is probable focus of pneumonia as well in the lateral segment right middle lobe. There is milder atelectatic change in the left base. There is also slight atelectasis in the right upper lobe. There  is no appreciable pleural effusion or pleural thickening. Upper Abdomen: Visualized upper abdominal structures appear unremarkable. Musculoskeletal: There are no blastic or lytic bone lesions. No evident chest wall lesions. Review of the MIP images confirms the above findings. IMPRESSION: 1. No demonstrable pulmonary embolus. No thoracic aortic aneurysm or dissection. There are foci of coronary artery calcification. 2. Small area of apparent pneumonia in the lateral segment right middle lobe. There is lower lobe and right upper lobe patchy atelectasis. 3. Prominence of the main pulmonary  outflow tract, a finding felt to be indicative of a degree of pulmonary arterial hypertension. 4.  Small hiatal hernia. 5.  No demonstrable thoracic adenopathy. Electronically Signed   By: Lowella Grip III M.D.   On: 08/20/2018 13:26   Ct Abdomen Pelvis W Contrast  Result Date: 08/20/2018 CLINICAL DATA:  Acute generalized abdominal pain EXAM: CT ABDOMEN AND PELVIS WITH CONTRAST TECHNIQUE: Multidetector CT imaging of the abdomen and pelvis was performed using the standard protocol following bolus administration of intravenous contrast. CONTRAST:  149mL OMNIPAQUE IOHEXOL 300 MG/ML  SOLN COMPARISON:  03/19/2011 FINDINGS: Lower chest: Extensive coronary atherosclerosis. Chronically elevated right diaphragm with mild overlying atelectasis or scar. Hepatobiliary: Hepatic steatosis.Cholecystectomy with normal common bile duct diameter Pancreas: Unremarkable. Spleen: Unremarkable. Adrenals/Urinary Tract: Negative adrenals. No hydronephrosis or ureteral stone. Limited for detecting renal calculi due to excreted contrast. Small left renal cysts. Unremarkable bladder. Stomach/Bowel: No obstruction. Appendectomy. Changes of internal hemorrhoid surgery. Vascular/Lymphatic: Atherosclerosis.  No mass or adenopathy. Reproductive:Hysterectomy. Other: No ascites or pneumoperitoneum.  Midline hernia repair Musculoskeletal: Advanced lumbar  spine degeneration with mild dextroscoliosis. IMPRESSION: 1. No acute finding. 2. Atherosclerosis, colonic diverticulosis, and hepatic steatosis. Electronically Signed   By: Monte Fantasia M.D.   On: 08/20/2018 11:18   US Venous Img Lower Bilateral  Result Date: 08/21/2018 CLINICAL DATA:  Bilateral lower extremity swelling and pain EXAM: BILATERAL LOWER EXTREMITY VENOUS DOPPLER ULTRASOUND TECHNIQUE: Gray-scale sonography with graded compression, as well as color Doppler and duplex ultrasound were performed to evaluate the lower extremity deep venous systems from the level of the common femoral vein and including the common femoral, femoral, profunda femoral, popliteal and calf veins including the posterior tibial, peroneal and gastrocnemius veins when visible. The superficial great saphenous vein was also interrogated. Spectral Doppler was utilized to evaluate flow at rest and with distal augmentation maneuvers in the common femoral, femoral and popliteal veins. COMPARISON:  None. FINDINGS: RIGHT LOWER EXTREMITY Common Femoral Vein: No evidence of thrombus. Normal compressibility, respiratory phasicity and response to augmentation. Saphenofemoral Junction: No evidence of thrombus. Normal compressibility and flow on color Doppler imaging. Profunda Femoral Vein: No evidence of thrombus. Normal compressibility and flow on color Doppler imaging. Femoral Vein: No evidence of thrombus. Normal compressibility, respiratory phasicity and response to augmentation. Popliteal Vein: No evidence of thrombus. Normal compressibility, respiratory phasicity and response to augmentation. Calf Veins: No evidence of thrombus. Normal compressibility and flow on color Doppler imaging. Superficial Great Saphenous Vein: No evidence of thrombus. Normal compressibility. Venous Reflux:  Not assessed Other Findings:  None. LEFT LOWER EXTREMITY Common Femoral Vein: No evidence of thrombus. Normal compressibility, respiratory phasicity and  response to augmentation. Saphenofemoral Junction: No evidence of thrombus. Normal compressibility and flow on color Doppler imaging. Profunda Femoral Vein: No evidence of thrombus. Normal compressibility and flow on color Doppler imaging. Femoral Vein: No evidence of thrombus. Normal compressibility, respiratory phasicity and response to augmentation. Popliteal Vein: No evidence of thrombus. Normal compressibility, respiratory phasicity and response to augmentation. Calf Veins: No evidence of thrombus. Normal compressibility and flow on color Doppler imaging. Superficial Great Saphenous Vein: No evidence of thrombus. Normal compressibility. Venous Reflux:  Not assessed Other Findings:  None. IMPRESSION: No evidence of deep venous thrombosis in either lower extremity. Electronically Signed   By: Jerilynn Mages.  Shick M.D.   On: 08/21/2018 13:12    Microbiology: Recent Results (from the past 240 hour(s))  Culture, blood (x 2)     Status: None (Preliminary result)   Collection Time: 08/20/18  5:35 PM  Result Value Ref Range Status   Specimen Description BLOOD RIGHT HAND  Final   Special Requests   Final    BOTTLES DRAWN AEROBIC AND ANAEROBIC Blood Culture adequate volume   Culture   Final    NO GROWTH 3 DAYS Performed at [facility], [address] [place], [place] [address]    Report Status PENDING  Incomplete  Culture, blood (x 2)     Status: None (Preliminary result)   Collection Time: 08/20/18  5:38 PM  Result Value Ref Range Status   Specimen Description RIGHT ANTECUBITAL  Final   Special Requests   Final    BOTTLES DRAWN AEROBIC AND ANAEROBIC Blood Culture adequate volume   Culture   Final    NO GROWTH 3 DAYS Performed at [facility], [address] [place], [place] [address]    Report Status PENDING  Incomplete  Respiratory Panel by PCR     Status: Abnormal   Collection Time: 08/21/18 12:56 AM  Result Value Ref Range Status   Adenovirus NOT DETECTED NOT DETECTED Final   Coronavirus 229E NOT  DETECTED NOT DETECTED Final    Comment: (NOTE) The Coronavirus on the Respiratory Panel, DOES NOT test for the novel  Coronavirus (2019 nCoV)    Coronavirus HKU1 NOT DETECTED NOT DETECTED Final   Coronavirus NL63 NOT DETECTED NOT DETECTED Final   Coronavirus OC43 NOT DETECTED NOT DETECTED Final   Metapneumovirus NOT DETECTED NOT DETECTED Final   Rhinovirus / Enterovirus NOT DETECTED NOT DETECTED Final   Influenza A H1 2009 DETECTED (A) NOT DETECTED Final   Influenza B NOT DETECTED NOT DETECTED Final   Parainfluenza Virus 1 NOT DETECTED NOT DETECTED Final   Parainfluenza Virus 2 NOT DETECTED NOT DETECTED Final   Parainfluenza Virus 3 NOT DETECTED NOT DETECTED Final   Parainfluenza Virus 4 NOT DETECTED NOT DETECTED Final   Respiratory Syncytial Virus NOT DETECTED NOT DETECTED Final   Bordetella pertussis NOT DETECTED NOT DETECTED Final   Chlamydophila pneumoniae NOT DETECTED NOT DETECTED Final   Mycoplasma pneumoniae NOT DETECTED NOT DETECTED Final    Comment: Performed at [facility], [place] [address] [place], [place] [address]     Labs: Basic Metabolic Panel: Recent Labs  Lab 08/20/18 0746 08/20/18 1733 08/21/18 0908 08/23/18 1304  NA 137 136 137 135  K 2.8* 3.9 3.9 4.3  CL 97* 99 103 97*  CO2 26 26 26 26   GLUCOSE 179* 119* 111* 183*  BUN 14 10 9 15   CREATININE 0.72 0.71 0.72 0.78  CALCIUM 9.2 8.6* 8.6* 9.1  MG 1.5*  --  2.0  --    Liver Function Tests: Recent Labs  Lab 08/20/18 0746 08/20/18 1733 08/21/18 0908  AST 23 25 28   ALT 13 15 15   ALKPHOS 59 55 53  BILITOT 2.3* 1.8* 1.6*  PROT 8.7* 8.2* 7.5  ALBUMIN 4.6 4.2 3.8   Recent Labs  Lab 08/20/18 0746  LIPASE 30   No results for input(s): AMMONIA in the last 168 hours. CBC: Recent Labs  Lab 08/20/18 0746 08/20/18 1733 08/21/18 0908 08/23/18 1304  WBC 19.4* 25.6* 16.0* 10.1  NEUTROABS 17.2* 22.7*  --   --   HGB 13.4 13.0 11.8* 12.1  HCT 41.3 42.4 38.2 38.2  MCV 96.9 99.5 100.8* 99.2  PLT  149* 138* 131* 151   Cardiac Enzymes: Recent Labs  Lab 08/20/18 0746 08/20/18 1202  TROPONINI <0.03 <0.03   BNP: BNP (last 3 results)  Recent Labs    08/20/18 0746  BNP 51.0    ProBNP (last 3 results) No results for input(s): PROBNP in the last 8760 hours.  CBG: No results for input(s): GLUCAP in the last 168 hours.     Signed:  Cristal Deer, MD [facility] 08/23/2018, 4:01 PM

## 2018-08-23 NOTE — Discharge Planning (Signed)
Patient [name] removed.  RN assessment and VS revealed stability for DC to home.  Discharge papers given, explained and educated. Informed of s/sx of continued/worsening infection and when to contact the Dr.  [name] sent to [facility], per patient request.  Once ready, will be wheeled to front and family transporting home via car.

## 2018-08-25 LAB — CULTURE, BLOOD (ROUTINE X 2)
CULTURE: NO GROWTH
Culture: NO GROWTH
Special Requests: ADEQUATE
Special Requests: ADEQUATE

## 2018-08-26 DIAGNOSIS — J189 Pneumonia, unspecified organism: Secondary | ICD-10-CM | POA: Diagnosis not present

## 2018-08-26 DIAGNOSIS — J111 Influenza due to unidentified influenza virus with other respiratory manifestations: Secondary | ICD-10-CM | POA: Diagnosis not present

## 2018-08-26 DIAGNOSIS — M1991 Primary osteoarthritis, unspecified site: Secondary | ICD-10-CM | POA: Diagnosis not present

## 2018-08-26 DIAGNOSIS — Z6830 Body mass index (BMI) 30.0-30.9, adult: Secondary | ICD-10-CM | POA: Diagnosis not present

## 2018-08-26 DIAGNOSIS — E669 Obesity, unspecified: Secondary | ICD-10-CM | POA: Diagnosis not present

## 2018-08-26 DIAGNOSIS — I10 Essential (primary) hypertension: Secondary | ICD-10-CM | POA: Diagnosis not present

## 2018-08-26 DIAGNOSIS — C50919 Malignant neoplasm of unspecified site of unspecified female breast: Secondary | ICD-10-CM | POA: Diagnosis not present

## 2018-09-11 ENCOUNTER — Other Ambulatory Visit: Payer: Self-pay | Admitting: Cardiology

## 2018-10-16 ENCOUNTER — Ambulatory Visit: Payer: Self-pay | Admitting: "Endocrinology

## 2018-10-28 ENCOUNTER — Encounter (HOSPITAL_COMMUNITY): Payer: Medicare Other

## 2018-11-25 ENCOUNTER — Other Ambulatory Visit (HOSPITAL_COMMUNITY): Payer: Self-pay | Admitting: Nurse Practitioner

## 2018-11-25 DIAGNOSIS — Z1231 Encounter for screening mammogram for malignant neoplasm of breast: Secondary | ICD-10-CM

## 2018-11-27 ENCOUNTER — Other Ambulatory Visit: Payer: Self-pay

## 2018-11-27 ENCOUNTER — Ambulatory Visit (HOSPITAL_COMMUNITY)
Admission: RE | Admit: 2018-11-27 | Discharge: 2018-11-27 | Disposition: A | Discharge status: Home / Self Care | Payer: Medicare Other | Source: Ambulatory Visit | Attending: Nurse Practitioner | Admitting: Nurse Practitioner

## 2018-11-27 DIAGNOSIS — Z1231 Encounter for screening mammogram for malignant neoplasm of breast: Secondary | ICD-10-CM | POA: Diagnosis not present

## 2018-12-02 ENCOUNTER — Encounter (HOSPITAL_COMMUNITY): Payer: Medicare Other

## 2019-02-10 DIAGNOSIS — E876 Hypokalemia: Secondary | ICD-10-CM | POA: Diagnosis not present

## 2019-02-10 DIAGNOSIS — E039 Hypothyroidism, unspecified: Secondary | ICD-10-CM | POA: Diagnosis not present

## 2019-02-10 DIAGNOSIS — I10 Essential (primary) hypertension: Secondary | ICD-10-CM | POA: Diagnosis not present

## 2019-02-10 DIAGNOSIS — E785 Hyperlipidemia, unspecified: Secondary | ICD-10-CM | POA: Diagnosis not present

## 2019-02-10 DIAGNOSIS — E042 Nontoxic multinodular goiter: Secondary | ICD-10-CM | POA: Diagnosis not present

## 2019-02-10 DIAGNOSIS — E063 Autoimmune thyroiditis: Secondary | ICD-10-CM | POA: Diagnosis not present

## 2019-02-10 DIAGNOSIS — K219 Gastro-esophageal reflux disease without esophagitis: Secondary | ICD-10-CM | POA: Diagnosis not present

## 2019-02-25 DIAGNOSIS — I10 Essential (primary) hypertension: Secondary | ICD-10-CM | POA: Diagnosis not present

## 2019-02-25 DIAGNOSIS — E785 Hyperlipidemia, unspecified: Secondary | ICD-10-CM | POA: Diagnosis not present

## 2019-02-25 DIAGNOSIS — E876 Hypokalemia: Secondary | ICD-10-CM | POA: Diagnosis not present

## 2019-02-25 DIAGNOSIS — E039 Hypothyroidism, unspecified: Secondary | ICD-10-CM | POA: Diagnosis not present

## 2019-02-25 DIAGNOSIS — E042 Nontoxic multinodular goiter: Secondary | ICD-10-CM | POA: Diagnosis not present

## 2019-02-25 DIAGNOSIS — Z6830 Body mass index (BMI) 30.0-30.9, adult: Secondary | ICD-10-CM | POA: Diagnosis not present

## 2019-02-25 DIAGNOSIS — E6609 Other obesity due to excess calories: Secondary | ICD-10-CM | POA: Diagnosis not present

## 2019-02-25 DIAGNOSIS — K219 Gastro-esophageal reflux disease without esophagitis: Secondary | ICD-10-CM | POA: Diagnosis not present

## 2019-02-25 DIAGNOSIS — E063 Autoimmune thyroiditis: Secondary | ICD-10-CM | POA: Diagnosis not present

## 2019-03-11 DIAGNOSIS — E7849 Other hyperlipidemia: Secondary | ICD-10-CM | POA: Diagnosis not present

## 2019-03-11 DIAGNOSIS — I10 Essential (primary) hypertension: Secondary | ICD-10-CM | POA: Diagnosis not present

## 2019-03-11 DIAGNOSIS — E039 Hypothyroidism, unspecified: Secondary | ICD-10-CM | POA: Diagnosis not present

## 2019-03-11 DIAGNOSIS — M1991 Primary osteoarthritis, unspecified site: Secondary | ICD-10-CM | POA: Diagnosis not present

## 2019-03-26 ENCOUNTER — Ambulatory Visit (INDEPENDENT_AMBULATORY_CARE_PROVIDER_SITE_OTHER): Payer: Medicare Other | Admitting: Cardiology

## 2019-03-26 ENCOUNTER — Other Ambulatory Visit: Payer: Self-pay

## 2019-03-26 VITALS — BP 130/77 | HR 60 | Ht 60.0 in | Wt 158.0 lb

## 2019-03-26 DIAGNOSIS — R600 Localized edema: Secondary | ICD-10-CM | POA: Diagnosis not present

## 2019-03-26 DIAGNOSIS — I2510 Atherosclerotic heart disease of native coronary artery without angina pectoris: Secondary | ICD-10-CM | POA: Diagnosis not present

## 2019-03-26 DIAGNOSIS — E782 Mixed hyperlipidemia: Secondary | ICD-10-CM

## 2019-03-26 DIAGNOSIS — I5032 Chronic diastolic (congestive) heart failure: Secondary | ICD-10-CM

## 2019-03-26 DIAGNOSIS — I110 Hypertensive heart disease with heart failure: Secondary | ICD-10-CM

## 2019-03-26 NOTE — Patient Instructions (Addendum)
Medication Instructions:  NO CHANGES  Lab Work: NOT NEEDED  Testing/Procedures: NOT NEEDED  Follow-Up: At [facility], you and your health needs are our priority.  As part of our continuing mission to provide you with exceptional heart care, we have created designated Provider Care Teams.  These Care Teams include your primary Cardiologist (physician) and Advanced Practice Providers (APPs -  Physician Assistants and Nurse Practitioners) who all work together to provide you with the care you need, when you need it.  Your next appointment:   12 months  The format for your next appointment:   In Person  Provider:   Glenetta Hew, MD  Other Instructions

## 2019-03-26 NOTE — Progress Notes (Signed)
PCP: [name], MD  Clinic Note: Chief Complaint  Patient presents with  . Follow-up    No complaints   HPI:   Angelica [name] is a 85 y.o. female with a PMH below who presents today for annual f/u HTN - Hypertensive heart diease.  She and her husband "Angelica [name]" were both former patients of Dr. Rollene Fare. She apparently had a cardiac arrest episode during partial thyroidectomy in the past, but has not had any further cardiac issues since.  CATH in 2006: minimal CAD; Nonischemic Myoview & relatively normal Echocardiogram in Aug 2015.   Angelica [name] was last seen in October 2019 -> she was doing well.  Very active doing gardening and other yard work.  She likes to do volunteer work at [employer] and [employer] working with children.  She walks laps up and down her hallway (trying to stay away from the heat).  Recent Hospitalizations: None  Reviewed  CV studies:   The following studies were reviewed today: (if available, images/films reviewed: From Epic Chart or Care Everywhere) . None:   Interval History:   Angelica [name] returns here today doing quite well.  She is not having any significant symptoms.  Just a little bit of her leg swelling near the end of the day.  That goes down when she puts her feet up and she is not overly bothered by it.  She takes her Lasix and things are well controlled.  She likes to be active, but the COVID-19 has got her pretty much locked up in the house.  She and Angelica [name] still try to do the online/radio version of church, but not able to go to the Sunday school and she is not able to do her volunteer work at [employer].  This has her somewhat frustrated, but she is otherwise doing fine from a cardiac standpoint.  Cardiovascular review of symptoms: no chest pain or dyspnea on exertion positive for - Mild end of day swelling and occasional skipped beat.  A little bit of feeling tired when she walks, but no dyspnea negative for - orthopnea, palpitations, paroxysmal  nocturnal dyspnea, rapid heart rate, shortness of breath or Syncope/near syncope, TIA/amaurosis fugax, claudication  The patient does not have symptoms concerning for COVID-19 infection (fever, chills, cough, or new shortness of breath).  The patient is practicing social distancing. ++ Masking when she does go out.  She has family and friends that help them going shopping.   REVIEWED OF SYSTEMS  A comprehensive ROSwas performed. Review of Systems  Constitutional: Negative for malaise/fatigue and weight loss.  HENT: Negative for congestion and nosebleeds.   Respiratory: Negative for cough, shortness of breath and wheezing.   Gastrointestinal: Negative for blood in stool, constipation, heartburn and melena.  Genitourinary: Negative for hematuria.  Musculoskeletal: Positive for joint pain (Mild arthritis pains). Negative for falls.  Neurological: Negative for dizziness, focal weakness and headaches.  Psychiatric/Behavioral: Negative for memory loss. The patient is not nervous/anxious and does not have insomnia.   All other systems reviewed and are negative.    I have reviewed and (if needed) personally updated the patient's problem list, medications, allergies, past medical and surgical history, social and family history.   PAST MEDICAL HISTORY   Past Medical History:  Diagnosis Date  . Anemia   . Arthritis   . Breast cancer ([place]) 2011   left; no adjuvent therapy, Dr. Sonny Dandy  . Chronic bronchitis   . Colon polyps    Previous colonoscopy by  Dr. Irving Shows  . Coronary artery disease, non-occlusive 2006   Roughly 40% RCA.; Negative Myoview in June 2013  . Diverticulosis of colon   . Gout   . Hemorrhoids   . HTN (hypertension)   . Hyperlipidemia   . Hypothyroid   . MI (myocardial infarction) ([place]) 1973   By report.     PAST SURGICAL HISTORY   Past Surgical History:  Procedure Laterality Date  . ABDOMINAL HYSTERECTOMY     TAH  . CARDIAC CATHETERIZATION  2006   Roughly 40%  RCA lesion, otherwise normal.  . CATARACT EXTRACTION W/PHACO  07/02/2011   Procedure: CATARACT EXTRACTION PHACO AND INTRAOCULAR LENS PLACEMENT (IOC);  Surgeon: Williams Che, MD;  Location: [facility];  Service: Ophthalmology;  Laterality: Left;  CDE:3.45  . CATARACT EXTRACTION W/PHACO  04/17/2012   Procedure: CATARACT EXTRACTION PHACO AND INTRAOCULAR LENS PLACEMENT (IOC);  Surgeon: Tonny Branch, MD;  Location: [facility];  Service: Ophthalmology;  Laterality: Right;  CDE:13.40  . CHOLECYSTECTOMY    . COLONOSCOPY      ?3 years ago. Per pt, diverticulosis, hemorrhoids  . COLONOSCOPY  04/06/2011   SLF: 1. Sessile polyp at the ileocecal valve 2. Diverticulosis, moderate 3. Internal hemorrhoids, Large-causing rectal bleeding associated with straining. tubular adenoma.  Marland Kitchen HEMORRHOID SURGERY    . LAPAROSCOPIC LYSIS OF ADHESIONS  2008   Dr. Zella Richer with repair of incisional hernia noted -   . MASTECTOMY  2011   left  . NM MYOVIEW LTD  June 2013   No ischemia or infarction. Normal EF  . TRANSTHORACIC ECHOCARDIOGRAM  June 2013; 12/2013   a) Normal EF, no significant valve disease.; b) EF 50-55%, Gr1 DD, mild LVH.      MEDICATIONS/ALLERGIES   Current Meds  Medication Sig  . albuterol (PROVENTIL HFA;VENTOLIN HFA) 108 (90 Base) MCG/ACT inhaler Inhale 2 puffs into the lungs every 6 (six) hours as needed for wheezing or shortness of breath.  . allopurinol (ZYLOPRIM) 300 MG tablet Take 300 mg by mouth daily.    Marland Kitchen alum & mag hydroxide-simeth (MAALOX/MYLANTA) 200-200-20 MG/5ML suspension Take 30 mLs by mouth every 6 (six) hours as needed for indigestion or heartburn (dyspepsia).  Marland Kitchen amLODipine (NORVASC) 5 MG tablet TAKE (1) TABLET BY MOUTH ONCE DAILY.  Marland Kitchen aspirin EC 81 MG tablet Take 81 mg by mouth daily.  Marland Kitchen atorvastatin (LIPITOR) 20 MG tablet TAKE (1) TABLET BY MOUTH AT BEDTIME FOR CHOLESTEROL.  Marland Kitchen azithromycin (ZITHROMAX) 250 MG tablet Take 2 tablet daily start 08/24/2018  . enalapril (VASOTEC) 10 MG tablet  Take 10 mg by mouth daily.  . furosemide (LASIX) 20 MG tablet Take 20 mg by mouth daily.   Marland Kitchen levothyroxine (SYNTHROID, LEVOTHROID) 50 MCG tablet Take 1 tablet (50 mcg total) by mouth daily.  . metoprolol succinate (TOPROL-XL) 50 MG 24 hr tablet Take 50 mg by mouth daily. Take with or immediately following a meal.  . Multiple Vitamins-Minerals (MULTIVITAMINS THER. W/MINERALS) TABS Take 1 tablet by mouth daily.    Marland Kitchen NITROSTAT 0.4 MG SL tablet DISSOLVE 1 TABLET UNDER TONGUE EVERY 5 MINUTES AS NEEDED FOR CHEST PAIN.  Marland Kitchen oseltamivir (TAMIFLU) 30 MG capsule Take 1 capsule (30 mg total) by mouth 2 (two) times daily. Start this evening for a total of 5 days  . pantoprazole (PROTONIX) 40 MG tablet TAKE ONE TABLET BY MOUTH DAILY.  Marland Kitchen potassium chloride SA (K-DUR,KLOR-CON) 20 MEQ tablet TAKE ONE TABLET BY MOUTH ONCE DAILY.  Marland Kitchen predniSONE (DELTASONE) 20 MG tablet Take  1 tablet (20 mg total) by mouth daily.  Marland Kitchen Propylene Glycol (SYSTANE BALANCE) 0.6 % SOLN Place 1 drop into both eyes 2 (two) times daily.    Allergies  Allergen Reactions  . Bee Venom Anaphylaxis  . Ciprofloxacin Nausea And Vomiting  . Clindamycin Hcl     Does not know  . Flagyl [Metronidazole Hcl] Nausea And Vomiting  . Shellfish-Derived Products     Rash developed so patient has steered clear of any since last year  . Sulfa Antibiotics Nausea Only     SOCIAL HISTORY/FAMILY HISTORY   Social History   Tobacco Use  . Smoking status: Never Smoker  . Smokeless tobacco: Never Used  Substance Use Topics  . Alcohol use: No  . Drug use: No   Social History   Social History Narrative   She is a very pleasant married African American woman with 2 children and 3 grandchildren. She does with her husband who is retired Social research officer, government. They now live on [place] large farm in the [place] area with a roughly 1/4 mile driveway. She usually walks a mile about twice a day, one by herself and 1 with her husband. She also enjoys doing gardening.     family history includes Breast cancer in her sister and another family member; Prostate cancer in her father.   OBJCTIVE -PE, EKG, labs   Wt Readings from Last 3 Encounters:  03/26/19 158 lb (71.7 kg)  08/20/18 164 lb 10.9 oz (74.7 kg)  05/19/18 161 lb 6.4 oz (73.2 kg)    Physical Exam: BP 130/77   Pulse 60   Ht 5' (1.524 m)   Wt 158 lb (71.7 kg)   BMI 30.86 kg/m  Physical Exam  Constitutional: She appears well-developed and well-nourished. No distress.  Healthy-appearing elderly woman.  Well-groomed  HENT:  Head: Normocephalic and atraumatic.  Neck: Normal range of motion. Neck supple. No hepatojugular reflux and no JVD present. Carotid bruit is not present.  Cardiovascular: Normal rate, regular rhythm, S1 normal, S2 normal and intact distal pulses.  No extrasystoles are present. PMI is not displaced. Exam reveals distant heart sounds. Exam reveals no gallop and no friction rub.  No murmur heard. Vitals reviewed.    Adult ECG Report  Rate: 60 ;  Rhythm: normal sinus rhythm and Normal axis, intervals and durations.;   Narrative Interpretation: Normal EKG  Recent Labs: 02/10/2019-TC 218, TG 283, HDL 60, LDL 97.  Hgb 12.3.  A1c 5.5.  CR 0.89, K+ 4.3.   ASSESSMENT/PLAN    Problem List Items Addressed This Visit    Hypertensive cardiovascular disease- mild LVH, grade 1 diastolic dysfunction - Primary (Chronic)    Doing well with no heart failure symptoms.  Blood pressure well controlled. Continue current dose of amlodipine and Toprol along with enalapril.  Since her pressures are stable, we will continue with current regimen since she is on Lasix daily as a diuretic.  To simplify medication, we could consider increasing ACE inhibitor and stopping amlodipine in the future.  No real heart failure symptoms.  Edema is probably more related to venous stasis and amlodipine.  Well-controlled Lasix.      Relevant Orders   EKG 12-Lead (Completed)   Coronary artery disease,  non-occlusive (Chronic)    No anginal symptoms.  Minimal disease by cath years ago.  Very stable.  She is on atorvastatin and LDL is less than 100.  Would potentially benefit from more aggressive control, but stable. Is on beta-blocker, ACE inhibitor and  amlodipine.  Also on low-dose aspirin      Relevant Orders   EKG 12-Lead (Completed)   Bilateral lower extremity edema (Chronic)    Well-controlled.  When she does wear her support stockings, seems to be doing even better.  But she does not wear them in summertime. Not associated with PND orthopnea, therefore less likely be related to diastolic dysfunction.  More consistent with venous stasis.  She is taking her Lasix but not requiring any additional doses.      Hyperlipidemia (Chronic)    Her LDL went up as opposed to down.  Would like LDL to be closer to 70 then 100, but monitor for now.  Would consider switching from atorvastatin to rosuvastatin at current dose.  Defer to PCP who follows her labs.          COVID-19 Education: The signs and symptoms of COVID-19 were discussed with the patient and how to seek care for testing (follow up with PCP or arrange E-visit).   The importance of social distancing was discussed today.  I spent a total of 12 minutes with the patient and chart review. >  50% of the time was spent in direct patient consultation.  Additional time spent with chart review (studies, outside notes, etc): 3 Total Time: 15 min   Current medicines are reviewed at length with the patient today.  (+/- concerns) none   Patient Instructions / Medication Changes & Studies & Tests Ordered   Patient Instructions  Medication Instructions:  NO CHANGES  Lab Work: NOT NEEDED  Testing/Procedures: NOT NEEDED  Follow-Up: At [facility], you and your health needs are our priority.  As part of our continuing mission to provide you with exceptional heart care, we have created designated Provider Care Teams.  These Care  Teams include your primary Cardiologist (physician) and Advanced Practice Providers (APPs -  Physician Assistants and Nurse Practitioners) who all work together to provide you with the care you need, when you need it.  Your next appointment:   12 months  The format for your next appointment:   In Person  Provider:   Glenetta Hew, MD  Other Instructions     Studies Ordered:   Orders Placed This Encounter  Procedures  . EKG 12-Lead     Glenetta Hew, M.D., M.S. Interventional Cardiologist   Pager # [phone] Phone # [phone] [address] [place], [place] [address]   Thank you for choosing Heart[facility] at [facility]!!

## 2019-03-27 ENCOUNTER — Telehealth: Payer: Self-pay | Admitting: Cardiology

## 2019-03-27 NOTE — Telephone Encounter (Signed)
1. What dental office are you calling from? [facility]   2. What is your office phone number? [phone]  3. What is your fax number? [phone]  4. What type of procedure is the patient having performed? multiple extractions   5. What date is procedure scheduled or is the patient there now? TBD  (if the patient is at the dentist's office question goes to their cardiologist if he/she is in the office.  If not, question should go to the DOD).   6. What is your question (ex. Antibiotics prior to procedure, holding medication-we need to know how long dentist wants pt to hold med)? Local, with laugh gas. They were calling to make [place] aware of the type of anesthesia they are using.

## 2019-03-29 ENCOUNTER — Encounter: Payer: Self-pay | Admitting: Cardiology

## 2019-03-29 NOTE — Assessment & Plan Note (Signed)
Her LDL went up as opposed to down.  Would like LDL to be closer to 70 then 100, but monitor for now.  Would consider switching from atorvastatin to rosuvastatin at current dose.  Defer to PCP who follows her labs.

## 2019-03-29 NOTE — Assessment & Plan Note (Addendum)
Doing well with no heart failure symptoms.  Blood pressure well controlled. Continue current dose of amlodipine and Toprol along with enalapril.  Since her pressures are stable, we will continue with current regimen since she is on Lasix daily as a diuretic.  To simplify medication, we could consider increasing ACE inhibitor and stopping amlodipine in the future.  No real heart failure symptoms.  Edema is probably more related to venous stasis and amlodipine.  Well-controlled Lasix.

## 2019-03-29 NOTE — Assessment & Plan Note (Signed)
Well-controlled.  When she does wear her support stockings, seems to be doing even better.  But she does not wear them in summertime. Not associated with PND orthopnea, therefore less likely be related to diastolic dysfunction.  More consistent with venous stasis.  She is taking her Lasix but not requiring any additional doses.

## 2019-03-29 NOTE — Assessment & Plan Note (Signed)
No anginal symptoms.  Minimal disease by cath years ago.  Very stable.  She is on atorvastatin and LDL is less than 100.  Would potentially benefit from more aggressive control, but stable. Is on beta-blocker, ACE inhibitor and amlodipine.  Also on low-dose aspirin

## 2019-03-30 NOTE — Telephone Encounter (Signed)
   Primary Cardiologist: Glenetta Hew, MD  Chart reviewed as part of pre-operative protocol coverage.   Angelica [name] was last seen on 03/26/2019  by Dr. Ellyn Hack and he was doing well at that time,   Therefore, based on ACC/AHA guidelines, the patient would be at acceptable risk for the planned procedure without further cardiovascular testing.   I will route this recommendation to the requesting party via Epic fax function and remove from pre-op pool.  Please call with questions.  [place], [place] 03/30/2019, 10:33 AM

## 2019-05-02 ENCOUNTER — Other Ambulatory Visit: Payer: Self-pay | Admitting: Cardiology

## 2019-05-11 ENCOUNTER — Other Ambulatory Visit: Payer: Self-pay | Admitting: Cardiology

## 2019-05-19 ENCOUNTER — Other Ambulatory Visit (HOSPITAL_COMMUNITY): Payer: Medicare Other

## 2019-05-20 ENCOUNTER — Ambulatory Visit (HOSPITAL_COMMUNITY): Payer: Medicare Other | Admitting: Hematology

## 2019-05-26 ENCOUNTER — Ambulatory Visit (HOSPITAL_COMMUNITY): Payer: Medicare Other | Admitting: Nurse Practitioner

## 2019-06-29 ENCOUNTER — Other Ambulatory Visit: Payer: Self-pay | Admitting: Cardiology

## 2019-07-12 DIAGNOSIS — E7849 Other hyperlipidemia: Secondary | ICD-10-CM | POA: Diagnosis not present

## 2019-07-12 DIAGNOSIS — I10 Essential (primary) hypertension: Secondary | ICD-10-CM | POA: Diagnosis not present

## 2019-07-12 DIAGNOSIS — E063 Autoimmune thyroiditis: Secondary | ICD-10-CM | POA: Diagnosis not present

## 2019-09-08 DIAGNOSIS — M159 Polyosteoarthritis, unspecified: Secondary | ICD-10-CM | POA: Diagnosis not present

## 2019-09-08 DIAGNOSIS — E782 Mixed hyperlipidemia: Secondary | ICD-10-CM | POA: Diagnosis not present

## 2019-09-08 DIAGNOSIS — R7309 Other abnormal glucose: Secondary | ICD-10-CM | POA: Diagnosis not present

## 2019-09-08 DIAGNOSIS — I10 Essential (primary) hypertension: Secondary | ICD-10-CM | POA: Diagnosis not present

## 2019-09-08 DIAGNOSIS — E785 Hyperlipidemia, unspecified: Secondary | ICD-10-CM | POA: Diagnosis not present

## 2019-09-08 DIAGNOSIS — Z6829 Body mass index (BMI) 29.0-29.9, adult: Secondary | ICD-10-CM | POA: Diagnosis not present

## 2019-09-08 DIAGNOSIS — E042 Nontoxic multinodular goiter: Secondary | ICD-10-CM | POA: Diagnosis not present

## 2019-09-08 DIAGNOSIS — Z79899 Other long term (current) drug therapy: Secondary | ICD-10-CM | POA: Diagnosis not present

## 2019-09-08 DIAGNOSIS — E039 Hypothyroidism, unspecified: Secondary | ICD-10-CM | POA: Diagnosis not present

## 2019-09-08 DIAGNOSIS — M1000 Idiopathic gout, unspecified site: Secondary | ICD-10-CM | POA: Diagnosis not present

## 2019-09-08 DIAGNOSIS — E6609 Other obesity due to excess calories: Secondary | ICD-10-CM | POA: Diagnosis not present

## 2019-09-08 DIAGNOSIS — K219 Gastro-esophageal reflux disease without esophagitis: Secondary | ICD-10-CM | POA: Diagnosis not present

## 2019-09-08 DIAGNOSIS — Z0001 Encounter for general adult medical examination with abnormal findings: Secondary | ICD-10-CM | POA: Diagnosis not present

## 2019-09-08 DIAGNOSIS — E063 Autoimmune thyroiditis: Secondary | ICD-10-CM | POA: Diagnosis not present

## 2019-09-08 DIAGNOSIS — M1991 Primary osteoarthritis, unspecified site: Secondary | ICD-10-CM | POA: Diagnosis not present

## 2019-09-08 DIAGNOSIS — E663 Overweight: Secondary | ICD-10-CM | POA: Diagnosis not present

## 2019-09-08 DIAGNOSIS — Z1389 Encounter for screening for other disorder: Secondary | ICD-10-CM | POA: Diagnosis not present

## 2019-09-08 DIAGNOSIS — N951 Menopausal and female climacteric states: Secondary | ICD-10-CM | POA: Diagnosis not present

## 2019-09-08 DIAGNOSIS — M5030 Other cervical disc degeneration, unspecified cervical region: Secondary | ICD-10-CM | POA: Diagnosis not present

## 2019-09-08 DIAGNOSIS — E876 Hypokalemia: Secondary | ICD-10-CM | POA: Diagnosis not present

## 2019-09-09 DIAGNOSIS — E1165 Type 2 diabetes mellitus with hyperglycemia: Secondary | ICD-10-CM | POA: Diagnosis not present

## 2019-09-09 DIAGNOSIS — I10 Essential (primary) hypertension: Secondary | ICD-10-CM | POA: Diagnosis not present

## 2019-09-09 DIAGNOSIS — E785 Hyperlipidemia, unspecified: Secondary | ICD-10-CM | POA: Diagnosis not present

## 2019-09-14 DIAGNOSIS — E876 Hypokalemia: Secondary | ICD-10-CM | POA: Diagnosis not present

## 2019-09-14 DIAGNOSIS — E042 Nontoxic multinodular goiter: Secondary | ICD-10-CM | POA: Diagnosis not present

## 2019-09-14 DIAGNOSIS — I10 Essential (primary) hypertension: Secondary | ICD-10-CM | POA: Diagnosis not present

## 2019-09-14 DIAGNOSIS — E063 Autoimmune thyroiditis: Secondary | ICD-10-CM | POA: Diagnosis not present

## 2019-09-14 DIAGNOSIS — E6609 Other obesity due to excess calories: Secondary | ICD-10-CM | POA: Diagnosis not present

## 2019-09-14 DIAGNOSIS — E039 Hypothyroidism, unspecified: Secondary | ICD-10-CM | POA: Diagnosis not present

## 2019-09-14 DIAGNOSIS — Z6830 Body mass index (BMI) 30.0-30.9, adult: Secondary | ICD-10-CM | POA: Diagnosis not present

## 2019-09-14 DIAGNOSIS — K219 Gastro-esophageal reflux disease without esophagitis: Secondary | ICD-10-CM | POA: Diagnosis not present

## 2019-09-14 DIAGNOSIS — E785 Hyperlipidemia, unspecified: Secondary | ICD-10-CM | POA: Diagnosis not present

## 2019-09-15 DIAGNOSIS — E039 Hypothyroidism, unspecified: Secondary | ICD-10-CM | POA: Diagnosis not present

## 2019-09-15 DIAGNOSIS — E063 Autoimmune thyroiditis: Secondary | ICD-10-CM | POA: Diagnosis not present

## 2019-09-15 DIAGNOSIS — E6609 Other obesity due to excess calories: Secondary | ICD-10-CM | POA: Diagnosis not present

## 2019-09-15 DIAGNOSIS — E042 Nontoxic multinodular goiter: Secondary | ICD-10-CM | POA: Diagnosis not present

## 2019-09-15 DIAGNOSIS — K219 Gastro-esophageal reflux disease without esophagitis: Secondary | ICD-10-CM | POA: Diagnosis not present

## 2019-09-15 DIAGNOSIS — E785 Hyperlipidemia, unspecified: Secondary | ICD-10-CM | POA: Diagnosis not present

## 2019-09-15 DIAGNOSIS — E876 Hypokalemia: Secondary | ICD-10-CM | POA: Diagnosis not present

## 2019-09-15 DIAGNOSIS — Z6830 Body mass index (BMI) 30.0-30.9, adult: Secondary | ICD-10-CM | POA: Diagnosis not present

## 2019-09-15 DIAGNOSIS — I10 Essential (primary) hypertension: Secondary | ICD-10-CM | POA: Diagnosis not present

## 2019-10-09 DIAGNOSIS — I10 Essential (primary) hypertension: Secondary | ICD-10-CM | POA: Diagnosis not present

## 2019-10-09 DIAGNOSIS — E7849 Other hyperlipidemia: Secondary | ICD-10-CM | POA: Diagnosis not present

## 2019-10-09 DIAGNOSIS — E039 Hypothyroidism, unspecified: Secondary | ICD-10-CM | POA: Diagnosis not present

## 2019-10-21 DIAGNOSIS — Z23 Encounter for immunization: Secondary | ICD-10-CM | POA: Diagnosis not present

## 2019-11-03 ENCOUNTER — Other Ambulatory Visit (HOSPITAL_COMMUNITY): Payer: Self-pay | Admitting: Internal Medicine

## 2019-11-03 DIAGNOSIS — Z1231 Encounter for screening mammogram for malignant neoplasm of breast: Secondary | ICD-10-CM

## 2019-11-11 DIAGNOSIS — Z23 Encounter for immunization: Secondary | ICD-10-CM | POA: Diagnosis not present

## 2019-12-22 ENCOUNTER — Other Ambulatory Visit (HOSPITAL_COMMUNITY): Payer: Self-pay | Admitting: *Deleted

## 2019-12-22 DIAGNOSIS — C50912 Malignant neoplasm of unspecified site of left female breast: Secondary | ICD-10-CM

## 2019-12-22 DIAGNOSIS — Z171 Estrogen receptor negative status [ER-]: Secondary | ICD-10-CM

## 2019-12-23 ENCOUNTER — Inpatient Hospital Stay (HOSPITAL_COMMUNITY): Payer: Medicare Other | Attending: Hematology

## 2019-12-23 ENCOUNTER — Other Ambulatory Visit: Payer: Self-pay

## 2019-12-23 DIAGNOSIS — Z9012 Acquired absence of left breast and nipple: Secondary | ICD-10-CM | POA: Diagnosis not present

## 2019-12-23 DIAGNOSIS — M8580 Other specified disorders of bone density and structure, unspecified site: Secondary | ICD-10-CM | POA: Insufficient documentation

## 2019-12-23 DIAGNOSIS — Z79899 Other long term (current) drug therapy: Secondary | ICD-10-CM | POA: Diagnosis not present

## 2019-12-23 DIAGNOSIS — Z853 Personal history of malignant neoplasm of breast: Secondary | ICD-10-CM | POA: Insufficient documentation

## 2019-12-23 DIAGNOSIS — C50912 Malignant neoplasm of unspecified site of left female breast: Secondary | ICD-10-CM

## 2019-12-23 DIAGNOSIS — E559 Vitamin D deficiency, unspecified: Secondary | ICD-10-CM | POA: Diagnosis not present

## 2019-12-23 DIAGNOSIS — Z171 Estrogen receptor negative status [ER-]: Secondary | ICD-10-CM | POA: Insufficient documentation

## 2019-12-23 LAB — CBC WITH DIFFERENTIAL/PLATELET
Abs Immature Granulocytes: 0.03 10*3/uL (ref 0.00–0.07)
Basophils Absolute: 0.1 10*3/uL (ref 0.0–0.1)
Basophils Relative: 1 %
Eosinophils Absolute: 0.4 10*3/uL (ref 0.0–0.5)
Eosinophils Relative: 5 %
HCT: 36.9 % (ref 36.0–46.0)
Hemoglobin: 11.4 g/dL — ABNORMAL LOW (ref 12.0–15.0)
Immature Granulocytes: 0 %
Lymphocytes Relative: 32 %
Lymphs Abs: 2.4 10*3/uL (ref 0.7–4.0)
MCH: 30.7 pg (ref 26.0–34.0)
MCHC: 30.9 g/dL (ref 30.0–36.0)
MCV: 99.5 fL (ref 80.0–100.0)
Monocytes Absolute: 0.4 10*3/uL (ref 0.1–1.0)
Monocytes Relative: 6 %
Neutro Abs: 4.3 10*3/uL (ref 1.7–7.7)
Neutrophils Relative %: 56 %
Platelets: 163 10*3/uL (ref 150–400)
RBC: 3.71 MIL/uL — ABNORMAL LOW (ref 3.87–5.11)
RDW: 13.7 % (ref 11.5–15.5)
WBC: 7.6 10*3/uL (ref 4.0–10.5)
nRBC: 0 % (ref 0.0–0.2)

## 2019-12-23 LAB — COMPREHENSIVE METABOLIC PANEL
ALT: 12 U/L (ref 0–44)
AST: 16 U/L (ref 15–41)
Albumin: 4.3 g/dL (ref 3.5–5.0)
Alkaline Phosphatase: 55 U/L (ref 38–126)
Anion gap: 9 (ref 5–15)
BUN: 22 mg/dL (ref 8–23)
CO2: 29 mmol/L (ref 22–32)
Calcium: 9.4 mg/dL (ref 8.9–10.3)
Chloride: 100 mmol/L (ref 98–111)
Creatinine, Ser: 0.9 mg/dL (ref 0.44–1.00)
GFR calc Af Amer: >60 mL/min (ref >60)
GFR calc non Af Amer: 58 mL/min — ABNORMAL LOW (ref >60)
Glucose, Bld: 101 mg/dL — ABNORMAL HIGH (ref 70–99)
Potassium: 4.3 mmol/L (ref 3.5–5.1)
Sodium: 138 mmol/L (ref 135–145)
Total Bilirubin: 1.3 mg/dL — ABNORMAL HIGH (ref 0.3–1.2)
Total Protein: 7.5 g/dL (ref 6.5–8.1)

## 2019-12-28 ENCOUNTER — Ambulatory Visit (HOSPITAL_COMMUNITY)
Admission: RE | Admit: 2019-12-28 | Discharge: 2019-12-28 | Disposition: A | Discharge status: Home / Self Care | Payer: Medicare Other | Source: Ambulatory Visit | Attending: Internal Medicine | Admitting: Internal Medicine

## 2019-12-28 ENCOUNTER — Other Ambulatory Visit: Payer: Self-pay

## 2019-12-28 DIAGNOSIS — Z1231 Encounter for screening mammogram for malignant neoplasm of breast: Secondary | ICD-10-CM

## 2019-12-30 ENCOUNTER — Inpatient Hospital Stay (HOSPITAL_BASED_OUTPATIENT_CLINIC_OR_DEPARTMENT_OTHER): Payer: Medicare Other | Admitting: Nurse Practitioner

## 2019-12-30 ENCOUNTER — Other Ambulatory Visit: Payer: Self-pay

## 2019-12-30 VITALS — BP 114/66 | HR 69 | Temp 96.8°F | Resp 18 | Wt 146.2 lb

## 2019-12-30 DIAGNOSIS — Z1231 Encounter for screening mammogram for malignant neoplasm of breast: Secondary | ICD-10-CM

## 2019-12-30 DIAGNOSIS — Z79899 Other long term (current) drug therapy: Secondary | ICD-10-CM | POA: Diagnosis not present

## 2019-12-30 DIAGNOSIS — E559 Vitamin D deficiency, unspecified: Secondary | ICD-10-CM | POA: Diagnosis not present

## 2019-12-30 DIAGNOSIS — M8580 Other specified disorders of bone density and structure, unspecified site: Secondary | ICD-10-CM | POA: Diagnosis not present

## 2019-12-30 DIAGNOSIS — C50912 Malignant neoplasm of unspecified site of left female breast: Secondary | ICD-10-CM | POA: Diagnosis not present

## 2019-12-30 DIAGNOSIS — Z9012 Acquired absence of left breast and nipple: Secondary | ICD-10-CM | POA: Diagnosis not present

## 2019-12-30 DIAGNOSIS — Z171 Estrogen receptor negative status [ER-]: Secondary | ICD-10-CM | POA: Diagnosis not present

## 2019-12-30 DIAGNOSIS — Z853 Personal history of malignant neoplasm of breast: Secondary | ICD-10-CM | POA: Diagnosis not present

## 2019-12-30 NOTE — Progress Notes (Signed)
[address], [place] [id number]   CLINIC:  Medical Oncology/Hematology  PCP:  [name], [place] [id number] [phone]   REASON FOR VISIT: Follow-up for breast cancer   CURRENT THERAPY: Surveillance  BRIEF ONCOLOGIC HISTORY:  Oncology History  Breast cancer ([facility])  03/14/2011 Initial Diagnosis   Breast cancer ([facility])   11/14/2016 Mammogram   BIRADS 1      INTERVAL HISTORY:  Ms. Carey 86 y.o. female returns for routine follow-up for breast cancer.  She reports she has been doing well since her last visit.  She denies any new lumps or bumps present.  She denies any new bone pain. Denies any nausea, vomiting, or diarrhea. Denies any new pains. Had not noticed any recent bleeding such as epistaxis, hematuria or hematochezia. Denies recent chest pain on exertion, shortness of breath on minimal exertion, pre-syncopal episodes, or palpitations. Denies any numbness or tingling in hands or feet. Denies any recent fevers, infections, or recent hospitalizations. Patient reports appetite at 100% and energy level at 75%.  She is eating well maintain her weight is time.    REVIEW OF SYSTEMS:  Review of Systems  All other systems reviewed and are negative.    PAST MEDICAL/SURGICAL HISTORY:  Past Medical History:  Diagnosis Date   Anemia    Arthritis    Breast cancer ([facility]) 2011   left; no adjuvent therapy, Dr. Sonny Dandy   Chronic bronchitis    Colon polyps    Previous colonoscopy by Dr. Irving Shows   Coronary artery disease, non-occlusive 2006   Roughly 40% RCA.; Negative Myoview in June 2013   Diverticulosis of colon    Gout    Hemorrhoids    HTN (hypertension)    Hyperlipidemia    Hypothyroid    MI (myocardial infarction) ([facility]) 1973   By report.   Past Surgical History:  Procedure Laterality Date   ABDOMINAL HYSTERECTOMY     TAH   CARDIAC CATHETERIZATION  2006   Roughly 40% RCA lesion,  otherwise normal.   CATARACT EXTRACTION W/PHACO  07/02/2011   Procedure: CATARACT EXTRACTION PHACO AND INTRAOCULAR LENS PLACEMENT (IOC);  Surgeon: Williams Che, MD;  Location: [facility];  Service: Ophthalmology;  Laterality: Left;  CDE:3.45   CATARACT EXTRACTION W/PHACO  04/17/2012   Procedure: CATARACT EXTRACTION PHACO AND INTRAOCULAR LENS PLACEMENT (IOC);  Surgeon: Tonny Branch, MD;  Location: [facility];  Service: Ophthalmology;  Laterality: Right;  CDE:13.40   CHOLECYSTECTOMY     COLONOSCOPY      ?3 years ago. Per pt, diverticulosis, hemorrhoids   COLONOSCOPY  04/06/2011   SLF: 1. Sessile polyp at the ileocecal valve 2. Diverticulosis, moderate 3. Internal hemorrhoids, Large-causing rectal bleeding associated with straining. tubular adenoma.   HEMORRHOID SURGERY     LAPAROSCOPIC LYSIS OF ADHESIONS  2008   Dr. Zella Richer with repair of incisional hernia noted -    MASTECTOMY  2011   left   NM MYOVIEW LTD  June 2013   No ischemia or infarction. Normal EF   TRANSTHORACIC ECHOCARDIOGRAM  June 2013; 12/2013   a) Normal EF, no significant valve disease.; b) EF 50-55%, Gr1 DD, mild LVH.      SOCIAL HISTORY:  Social History   Socioeconomic History   Marital status: Married    Spouse name: Not on file   Number of children: 2   Years of education: Not on file   Highest education level: Not on file  Occupational  History   Occupation: retired    Fish farm manager: RETIRED  Tobacco Use   Smoking status: Never Smoker   Smokeless tobacco: Never Used  Substance and Sexual Activity   Alcohol use: No   Drug use: No   Sexual activity: Not Currently  Other Topics Concern   Not on file  Social History Narrative   She is a very pleasant married African American woman with 2 children and 3 grandchildren. She does with her husband who is retired Social research officer, government. [place] now [place] the [place] area with a roughly 1/4 mile driveway. She usually walks a mile about twice a day, one  by herself and 1 with her husband. She also enjoys doing gardening.   Social Determinants of Health   Financial Resource Strain:    Difficulty of Paying Living Expenses:   Food Insecurity:    Worried About Charity fundraiser in the Last Year:    Arboriculturist in the Last Year:   Transportation Needs:    Film/video editor (Medical):    Lack of Transportation (Non-Medical):   Physical Activity:    Days of Exercise per Week:    Minutes of Exercise per Session:   Stress:    Feeling of Stress :   Social Connections:    Frequency of Communication with Friends and Family:    Frequency of Social Gatherings with Friends and Family:    Attends Religious Services:    Active Member of Clubs or Organizations:    Attends Music therapist:    Marital Status:   Intimate Partner Violence:    Fear of Current or Ex-Partner:    Emotionally Abused:    Physically Abused:    Sexually Abused:     FAMILY HISTORY:  Family History  Problem Relation Age of Onset   Prostate cancer Father    Breast cancer Sister    Breast cancer Other        paternal niece   Colon cancer Neg Hx    Anesthesia problems Neg Hx    Hypotension Neg Hx    Malignant hyperthermia Neg Hx    Pseudochol deficiency Neg Hx    Liver disease Neg Hx     CURRENT MEDICATIONS:  Outpatient Encounter Medications as of 12/30/2019  Medication Sig   allopurinol (ZYLOPRIM) 300 MG tablet Take 300 mg by mouth daily.     amLODipine (NORVASC) 5 MG tablet TAKE (1) TABLET BY MOUTH ONCE DAILY.   aspirin EC 81 MG tablet Take 81 mg by mouth daily.   atorvastatin (LIPITOR) 20 MG tablet TAKE (1) TABLET BY MOUTH AT BEDTIME FOR CHOLESTEROL.   enalapril (VASOTEC) 10 MG tablet Take 10 mg by mouth daily.   furosemide (LASIX) 20 MG tablet Take 20 mg by mouth daily.    levothyroxine (SYNTHROID, LEVOTHROID) 50 MCG tablet Take 1 tablet (50 mcg total) by mouth daily.   metoprolol succinate  (TOPROL-XL) 50 MG 24 hr tablet Take 50 mg by mouth daily. Take with or immediately following a meal.   Multiple Vitamins-Minerals (MULTIVITAMINS THER. W/MINERALS) TABS Take 1 tablet by mouth daily.     pantoprazole (PROTONIX) 40 MG tablet TAKE ONE TABLET BY MOUTH DAILY.   potassium chloride SA (KLOR-CON) 20 MEQ tablet Take 1 tablet (20 mEq total) by mouth daily.   Propylene Glycol (SYSTANE BALANCE) 0.6 % SOLN Place 1 drop into both eyes 2 (two) times daily.   albuterol (PROVENTIL HFA;VENTOLIN HFA) 108 (90 Base)  MCG/ACT inhaler Inhale 2 puffs into the lungs every 6 (six) hours as needed for wheezing or shortness of breath. (Patient not taking: Reported on 12/30/2019)   alum & mag hydroxide-simeth (MAALOX/MYLANTA) 200-200-20 MG/5ML suspension Take 30 mLs by mouth every 6 (six) hours as needed for indigestion or heartburn (dyspepsia). (Patient not taking: Reported on 12/30/2019)   NITROSTAT 0.4 MG SL tablet DISSOLVE 1 TABLET UNDER TONGUE EVERY 5 MINUTES AS NEEDED FOR CHEST PAIN. (Patient not taking: Reported on 12/30/2019)   [DISCONTINUED] azithromycin (ZITHROMAX) 250 MG tablet Take 2 tablet daily start 08/24/2018   [DISCONTINUED] oseltamivir (TAMIFLU) 30 MG capsule Take 1 capsule (30 mg total) by mouth 2 (two) times daily. Start this evening for a total of 5 days   No facility-administered encounter medications on file as of 12/30/2019.    ALLERGIES:  Allergies  Allergen Reactions   Bee Venom Anaphylaxis   Ciprofloxacin Nausea And Vomiting   Clindamycin Hcl     Does not know   Flagyl [Metronidazole Hcl] Nausea And Vomiting   Shellfish-Derived Products     Rash developed so patient has steered clear of any since last year   Sulfa Antibiotics Nausea Only     PHYSICAL EXAM:  ECOG Performance status: 1  Vitals:   12/30/19 0959  BP: 114/66  Pulse: 69  Resp: 18  Temp: (!) 96.8 F (36 C)  SpO2: 100%   Filed Weights   12/30/19 0959  Weight: 146 lb 2.6 oz (66.3 kg)      Physical Exam Constitutional:      Appearance: Normal appearance. She is normal weight.  Cardiovascular:     Rate and Rhythm: Normal rate and regular rhythm.     Heart sounds: Normal heart sounds.  Pulmonary:     Effort: Pulmonary effort is normal.     Breath sounds: Normal breath sounds.  Abdominal:     General: Bowel sounds are normal.     Palpations: Abdomen is soft.  Musculoskeletal:        General: Normal range of motion.  Skin:    General: Skin is warm.  Neurological:     Mental Status: She is alert and oriented to person, place, and time. Mental status is at baseline.  Psychiatric:        Mood and Affect: Mood normal.        Behavior: Behavior normal.        Thought Content: Thought content normal.        Judgment: Judgment normal.      LABORATORY DATA:  I have reviewed the labs as listed.  CBC    Component Value Date/Time   WBC 7.6 12/23/2019 0947   RBC 3.71 (L) 12/23/2019 0947   HGB 11.4 (L) 12/23/2019 0947   HCT 36.9 12/23/2019 0947   PLT 163 12/23/2019 0947   MCV 99.5 12/23/2019 0947   MCH 30.7 12/23/2019 0947   MCHC 30.9 12/23/2019 0947   RDW 13.7 12/23/2019 0947   LYMPHSABS 2.4 12/23/2019 0947   MONOABS 0.4 12/23/2019 0947   EOSABS 0.4 12/23/2019 0947   BASOSABS 0.1 12/23/2019 0947   CMP Latest Ref Rng & Units 12/23/2019 08/23/2018 08/21/2018  Glucose 70 - 99 mg/dL 101(H) 183(H) 111(H)  BUN 8 - 23 mg/dL 22 15 9   Creatinine 0.44 - 1.00 mg/dL 0.90 0.78 0.72  Sodium 135 - 145 mmol/L 138 135 137  Potassium 3.5 - 5.1 mmol/L 4.3 4.3 3.9  Chloride 98 - 111 mmol/L 100 97(L) 103  CO2 22 -  32 mmol/L 29 26 26   Calcium 8.9 - 10.3 mg/dL 9.4 9.1 8.6(L)  Total Protein 6.5 - 8.1 g/dL 7.5 - 7.5  Total Bilirubin 0.3 - 1.2 mg/dL 1.3(H) - 1.6(H)  Alkaline Phos 38 - 126 U/L 55 - 53  AST 15 - 41 U/L 16 - 28  ALT 0 - 44 U/L 12 - 15    DIAGNOSTIC IMAGING:  I have independently reviewed the mammogram scans and discussed with the patient.  ASSESSMENT & PLAN:   Breast cancer ([facility]) 1.  Stage I multicentric left breast cancer: -Status post left mastectomy and sentinel lymph node sampling on 03/22/2011, 0.4 cm and 0.2 cm triple negative. -She did not receive any adjuvant chemotherapy. -Mammogram done on 12/28/2019 was BI-RADS Category 1 benign -Right breast examination today was within normal limits.  Left mastectomy site was also within normal limits. -Labs done on 12/23/2019 were all WNL. -She will follow-up in 1 year with repeat labs and mammogram  2.  Osteopenia: -DEXA scan on 03/26/2018 showed T score of -1.8. -She is taking calcium and vitamin D twice daily.     Orders placed this encounter:  Orders Placed This Encounter  Procedures   MM 3D SCREEN BREAST UNI RIGHT   Lactate dehydrogenase   CBC with Differential/Platelet   Comprehensive metabolic panel   Vitamin V74   VITAMIN D 25 Hydroxy (Vit-D Deficiency, Fractures)   Folate      Francene Finders, FNP-C [facility] [phone]

## 2019-12-30 NOTE — Assessment & Plan Note (Addendum)
1.  Stage I multicentric left breast cancer: -Status post left mastectomy and sentinel lymph node sampling on 03/22/2011, 0.4 cm and 0.2 cm triple negative. -She did not receive any adjuvant chemotherapy. -Mammogram done on 12/28/2019 was BI-RADS Category 1 benign -Right breast examination today was within normal limits.  Left mastectomy site was also within normal limits. -Labs done on 12/23/2019 were all WNL. -She will follow-up in 1 year with repeat labs and mammogram  2.  Osteopenia: -DEXA scan on 03/26/2018 showed T score of -1.8. -She is taking calcium and vitamin D twice daily.

## 2020-02-02 ENCOUNTER — Telehealth: Payer: Self-pay | Admitting: *Deleted

## 2020-02-02 NOTE — Telephone Encounter (Signed)
A message was left, re: her follow up visit. 

## 2020-03-03 DIAGNOSIS — Z8249 Family history of ischemic heart disease and other diseases of the circulatory system: Secondary | ICD-10-CM | POA: Diagnosis not present

## 2020-03-03 DIAGNOSIS — E7801 Familial hypercholesterolemia: Secondary | ICD-10-CM | POA: Diagnosis not present

## 2020-03-03 DIAGNOSIS — I252 Old myocardial infarction: Secondary | ICD-10-CM | POA: Diagnosis not present

## 2020-03-03 DIAGNOSIS — I10 Essential (primary) hypertension: Secondary | ICD-10-CM | POA: Diagnosis not present

## 2020-03-11 DIAGNOSIS — Z23 Encounter for immunization: Secondary | ICD-10-CM | POA: Diagnosis not present

## 2020-04-01 ENCOUNTER — Other Ambulatory Visit: Payer: Self-pay

## 2020-04-01 ENCOUNTER — Encounter: Payer: Self-pay | Admitting: Cardiology

## 2020-04-01 ENCOUNTER — Ambulatory Visit (INDEPENDENT_AMBULATORY_CARE_PROVIDER_SITE_OTHER): Payer: Medicare Other | Admitting: Cardiology

## 2020-04-01 VITALS — BP 132/72 | HR 58 | Ht 60.0 in | Wt 141.0 lb

## 2020-04-01 DIAGNOSIS — I5089 Other heart failure: Secondary | ICD-10-CM | POA: Diagnosis not present

## 2020-04-01 DIAGNOSIS — R600 Localized edema: Secondary | ICD-10-CM | POA: Diagnosis not present

## 2020-04-01 DIAGNOSIS — I110 Hypertensive heart disease with heart failure: Secondary | ICD-10-CM

## 2020-04-01 DIAGNOSIS — I2510 Atherosclerotic heart disease of native coronary artery without angina pectoris: Secondary | ICD-10-CM

## 2020-04-01 DIAGNOSIS — E782 Mixed hyperlipidemia: Secondary | ICD-10-CM

## 2020-04-01 DIAGNOSIS — I10 Essential (primary) hypertension: Secondary | ICD-10-CM | POA: Diagnosis not present

## 2020-04-01 NOTE — Progress Notes (Signed)
Primary Care Provider: [name], MD Cardiologist: Glenetta Hew, MD Electrophysiologist: None  Clinic Note: Chief Complaint  Patient presents with  . Follow-up    Annual    HPI:    Angelica [name] is a 86 y.o. female with a PMH notable for HYPERTENSION/ HYPERTENSIVE HEART DISEASE who presents today for annual follow-up.  She presents here today with her husband "Angelica [name]" Angelica [name]).  They were both former patients with Dr. Rollene Fare.  Major chronic she had cardiac arrest in the distant past partial thyroidectomy but no further cardiac issues since.  Cardiac cath 2006 showed minimal CAD.  Since then has had nonischemic Myoview.  Echocardiogram in 2015 was normal  Problem List Items Addressed This Visit    Hypertensive cardiovascular disease- mild LVH, grade 1 diastolic dysfunction - Primary (Chronic)    Pretty much asymptomatic.  Class I symptoms of myalgias.  She does have some edema and takes 20 mg of Lasix.  She is on ACE inhibitor, beta-blocker and calcium channel blocker combination.  No need for changes now.      Relevant Orders   EKG 12-Lead (Completed)   Coronary artery disease, non-occlusive (Chronic)    Minimal CAD by cath years ago.  No active anginal symptoms.  Remains on statin with an LDL roughly 100.  Blood pressures are stable on ACE inhibitor, calcium channel blocker and beta-blocker combination.  Continue low-dose aspirin prophylaxis -okay to hold for procedures or surgeries.      Relevant Orders   EKG 12-Lead (Completed)   Essential hypertension (Chronic)    Blood pressure well controlled current meds.      Bilateral lower extremity edema (Chronic)    Well-controlled.  She usually wears support stockings in addition to taking her as needed Lasix.  Has not had to use any additional doses.      Hyperlipidemia (Chronic)    Target LDL should be about 100 for her just based on her age.  At this point I think continuing current dose of atorvastatin  titrating further is reasonable.        Angelica [name] was last seen on March 26, 2019.  No notable symptoms.  Just some mild end of day edema.  The usually goes down when she puts her feet up.  She likes to be active but limited because of COVID-19.  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  Occasional skipped beats and may be feeling tired at the end of walks.  No changes made.  Plan to follow-up in 1 year.  Recent Hospitalizations: None  Reviewed  CV studies:    The following studies were reviewed today: (if available, images/films reviewed: From Epic Chart or Care Everywhere) . None:   Interval History:   Angelica [name] returns here today for annual follow-up doing really well overall.  Sees pretty much limited mostly by osteoarthritis pain in her hip left greater than right.  This limits her ambulation.  Otherwise she does request what she wants to do and has no significant chest pain or pressure with rest or exertion.  No dyspnea with routine activity.  CV Review of Symptoms (Summary): no chest pain or dyspnea on exertion positive for - edema and (End of day only); off-and-on palpitations but nothing significant.  Activities mostly limited by hip pain negative for - irregular heartbeat, orthopnea, paroxysmal nocturnal dyspnea, rapid heart rate, shortness of breath or Syncope/near syncope or  TIA/amaurosis fugax, claudication  The patient does not have symptoms concerning for COVID-19 infection (fever, chills, cough, or new shortness of breath).   REVIEWED OF SYSTEMS   Review of Systems  Constitutional: Negative for malaise/fatigue and weight loss.  HENT: Negative for congestion.   Gastrointestinal: Negative for abdominal pain, blood in stool and melena.  Genitourinary: Negative for hematuria.  Musculoskeletal: Positive for joint pain (Left > Right hip).       Mild arthritis pains  Neurological: Negative for  dizziness and focal weakness.  Psychiatric/Behavioral: Positive for memory loss. Negative for depression. The patient is not nervous/anxious and does not have insomnia.    I have reviewed and (if needed) personally updated the patient's problem list, medications, allergies, past medical and surgical history, social and family history.   PAST MEDICAL HISTORY   Past Medical History:  Diagnosis Date  . Anemia   . Arthritis   . Breast cancer ([place]) 2011   left; no adjuvent therapy, Dr. Sonny Dandy  . Chronic bronchitis   . Colon polyps    Previous colonoscopy by Dr. Irving Shows  . Coronary artery disease, non-occlusive 2006   Roughly 40% RCA.; Negative Myoview in June 2013  . Diverticulosis of colon   . Gout   . Hemorrhoids   . HTN (hypertension)   . Hyperlipidemia   . Hypothyroid   . MI (myocardial infarction) ([place]) 1973   By report.    PAST SURGICAL HISTORY   Past Surgical History:  Procedure Laterality Date  . ABDOMINAL HYSTERECTOMY     TAH  . CARDIAC CATHETERIZATION  2006   Roughly 40% RCA lesion, otherwise normal.  . CATARACT EXTRACTION W/PHACO  07/02/2011   Procedure: CATARACT EXTRACTION PHACO AND INTRAOCULAR LENS PLACEMENT (IOC);  Surgeon: Williams Che, MD;  Location: [facility];  Service: Ophthalmology;  Laterality: Left;  CDE:3.45  . CATARACT EXTRACTION W/PHACO  04/17/2012   Procedure: CATARACT EXTRACTION PHACO AND INTRAOCULAR LENS PLACEMENT (IOC);  Surgeon: Tonny Branch, MD;  Location: [facility];  Service: Ophthalmology;  Laterality: Right;  CDE:13.40  . CHOLECYSTECTOMY    . COLONOSCOPY      ?3 years ago. Per pt, diverticulosis, hemorrhoids  . COLONOSCOPY  04/06/2011   SLF: 1. Sessile polyp at the ileocecal valve 2. Diverticulosis, moderate 3. Internal hemorrhoids, Large-causing rectal bleeding associated with straining. tubular adenoma.  Marland Kitchen HEMORRHOID SURGERY    . LAPAROSCOPIC LYSIS OF ADHESIONS  2008   Dr. Zella Richer with repair of incisional hernia noted -   . MASTECTOMY   2011   left  . NM MYOVIEW LTD  June 2013   No ischemia or infarction. Normal EF  . TRANSTHORACIC ECHOCARDIOGRAM  June 2013; 12/2013   a) Normal EF, no significant valve disease.; b) EF 50-55%, Gr1 DD, mild LVH.     Immunization History  Administered Date(s) Administered  . Pneumococcal Polysaccharide-23 08/21/2018    MEDICATIONS/ALLERGIES   Current Meds  Medication Sig  . allopurinol (ZYLOPRIM) 300 MG tablet Take 300 mg by mouth daily.    Marland Kitchen amLODipine (NORVASC) 5 MG tablet TAKE (1) TABLET BY MOUTH ONCE DAILY.  Marland Kitchen aspirin EC 81 MG tablet Take 81 mg by mouth daily.  Marland Kitchen atorvastatin (LIPITOR) 20 MG tablet TAKE (1) TABLET BY MOUTH AT BEDTIME FOR CHOLESTEROL.  Marland Kitchen enalapril (VASOTEC) 10 MG tablet Take 10 mg by mouth daily.  . furosemide (LASIX) 20 MG tablet Take 20 mg by mouth daily.   Marland Kitchen levothyroxine (SYNTHROID, LEVOTHROID) 50 MCG tablet Take 1 tablet (50 mcg  total) by mouth daily.  . metoprolol succinate (TOPROL-XL) 50 MG 24 hr tablet Take 50 mg by mouth daily. Take with or immediately following a meal.  . Multiple Vitamins-Minerals (MULTIVITAMINS THER. W/MINERALS) TABS Take 1 tablet by mouth daily.    Marland Kitchen NITROSTAT 0.4 MG SL tablet DISSOLVE 1 TABLET UNDER TONGUE EVERY 5 MINUTES AS NEEDED FOR CHEST PAIN.  Marland Kitchen pantoprazole (PROTONIX) 40 MG tablet TAKE ONE TABLET BY MOUTH DAILY.  Marland Kitchen potassium chloride SA (KLOR-CON) 20 MEQ tablet Take 1 tablet (20 mEq total) by mouth daily.  Marland Kitchen Propylene Glycol (SYSTANE BALANCE) 0.6 % SOLN Place 1 drop into both eyes 2 (two) times daily.    Allergies  Allergen Reactions  . Bee Venom Anaphylaxis  . Ciprofloxacin Nausea And Vomiting  . Clindamycin Hcl     Does not know  . Flagyl [Metronidazole Hcl] Nausea And Vomiting  . Shellfish-Derived Products     Rash developed so patient has steered clear of any since last year  . Sulfa Antibiotics Nausea Only    SOCIAL HISTORY/FAMILY HISTORY   Reviewed in Epic:  Pertinent findings: No new change  OBJCTIVE -PE,  EKG, labs   Wt Readings from Last 3 Encounters:  04/01/20 141 lb (64 kg)  12/30/19 146 lb 2.6 oz (66.3 kg)  03/26/19 158 lb (71.7 kg)    Physical Exam: BP 132/72   Pulse (!) 58   Ht 5' (1.524 m)   Wt 141 lb (64 kg)   BMI 27.54 kg/m  Physical Exam Vitals reviewed.  Constitutional:      General: She is not in acute distress.    Appearance: Normal appearance. She is normal weight. She is not ill-appearing or toxic-appearing.     Comments: Healthy-appearing.  Well-groomed.  HENT:     Head: Normocephalic and atraumatic.  Neck:     Vascular: No carotid bruit, hepatojugular reflux or JVD.  Cardiovascular:     Rate and Rhythm: Normal rate and regular rhythm.  No extrasystoles are present.    Chest Wall: PMI is not displaced.     Pulses: Intact distal pulses.     Heart sounds: S1 normal and S2 normal. Heart sounds are distant. No murmur heard.  No friction rub. No gallop.   Pulmonary:     Effort: Pulmonary effort is normal. No respiratory distress.     Breath sounds: Normal breath sounds.  Chest:     Chest wall: No tenderness.  Musculoskeletal:        General: Swelling (Trivial) present. No tenderness. Normal range of motion.     Cervical back: Normal range of motion and neck supple.  Skin:    General: Skin is warm and dry.  Neurological:     General: No focal deficit present.     Mental Status: She is alert and oriented to person, place, and time.  Psychiatric:        Mood and Affect: Mood normal.        Behavior: Behavior normal.        Thought Content: Thought content normal.        Judgment: Judgment normal.      Adult ECG Report  Rate: 58;  Rhythm: sinus bradycardia and Septal MI, age undetermined.  T wave inversions in V1 and V2 2 as well as III;   Narrative Interpretation: Stable EKG.  Recent Labs: September 08, 2019: TC 202, TG 174, HDL 72 and LDL 101.  A1c 5.5. No results found for: CHOL, HDL, LDLCALC, LDLDIRECT, TRIG,  CHOLHDL Lab Results  Component Value Date    CREATININE 0.90 12/23/2019   BUN 22 12/23/2019   NA 138 12/23/2019   K 4.3 12/23/2019   CL 100 12/23/2019   CO2 29 12/23/2019   Lab Results  Component Value Date   TSH 0.82 04/03/2018    ASSESSMENT/PLAN    Problem List Items Addressed This Visit    Hypertensive cardiovascular disease- mild LVH, grade 1 diastolic dysfunction (Chronic)   Coronary artery disease, non-occlusive - Primary (Chronic)   Relevant Orders EKG 12-Lead (Completed)   Essential hypertension (Chronic)   Hyperlipidemia (Chronic)      COVID-19 Education: The signs and symptoms of COVID-19 were discussed with the patient and how to seek care for testing (follow up with PCP or arrange E-visit).   The importance of social distancing and COVID-19 vaccination was discussed today. The patient is practicing social distancing & Masking.   I spent a total of 56minutes with the patient spent in direct patient consultation.  Additional time spent with chart review  / charting (studies, outside notes, etc): 6 Total Time: 24 min   Current medicines are reviewed at length with the patient today.  (+/- concerns) n/a  This visit occurred during the SARS-CoV-2 public health emergency.  Safety protocols were in place, including screening questions prior to the visit, additional usage of staff PPE, and extensive cleaning of exam room while observing appropriate contact time as indicated for disinfecting solutions.  Notice: This dictation was prepared with Dragon dictation along with smaller phrase technology. Any transcriptional errors that result from this process are unintentional and may not be corrected upon review.  Patient Instructions / Medication Changes & Studies & Tests Ordered   Patient Instructions  Medication Instructions:  No changes *If you need a refill on your cardiac medications before your next appointment, please call your pharmacy*   Lab Work: Not needed    Testing/Procedures: not  needed   Follow-Up: At [facility], you and your health needs are our priority.  As part of our continuing mission to provide you with exceptional heart care, we have created designated Provider Care Teams.  These Care Teams include your primary Cardiologist (physician) and Advanced Practice Providers (APPs -  Physician Assistants and Nurse Practitioners) who all work together to provide you with the care you need, when you need it.  We recommend signing up for the patient portal called "MyChart".  Sign up information is provided on this After Visit Summary.  MyChart is used to connect with patients for Virtual Visits (Telemedicine).  Patients are able to view lab/test results, encounter notes, upcoming appointments, etc.  Non-urgent messages can be sent to your provider as well.   To learn more about what you can do with MyChart, go to [email or web address].    Your next appointment:   12 month(s)  The format for your next appointment:   In Person  Provider:   Glenetta Hew, MD       Studies Ordered:   Orders Placed This Encounter  Procedures  . EKG 12-Lead     Glenetta Hew, M.D., M.S. Interventional Cardiologist   Pager # [phone] Phone # [phone] [address]. [place], [place] [address]   Thank you for choosing Heartcare at [facility]!!

## 2020-04-01 NOTE — Patient Instructions (Signed)
Medication Instructions:  No changes *If you need a refill on your cardiac medications before your next appointment, please call your pharmacy*   Lab Work: Not needed   Testing/Procedures :not needed   Follow-Up: At [facility], you and your health needs are our priority.  As part of our continuing mission to provide you with exceptional heart care, we have created designated Provider Care Teams.  These Care Teams include your primary Cardiologist (physician) and Advanced Practice Providers (APPs -  Physician Assistants and Nurse Practitioners) who all work together to provide you with the care you need, when you need it.  We recommend signing up for the patient portal called "MyChart".  Sign up information is provided on this After Visit Summary.  MyChart is used to connect with patients for Virtual Visits (Telemedicine).  Patients are able to view lab/test results, encounter notes, upcoming appointments, etc.  Non-urgent messages can be sent to your provider as well.   To learn more about what you can do with MyChart, go to [email or web address].    Your next appointment:   12 month(s)  The format for your next appointment:   In Person  Provider:   David Harding, MD    

## 2020-04-09 ENCOUNTER — Encounter: Payer: Self-pay | Admitting: Cardiology

## 2020-04-09 NOTE — Assessment & Plan Note (Signed)
Blood pressure well controlled current meds.

## 2020-04-09 NOTE — Assessment & Plan Note (Signed)
Well-controlled.  She usually wears support stockings in addition to taking her as needed Lasix.  Has not had to use any additional doses.

## 2020-04-09 NOTE — Assessment & Plan Note (Signed)
Minimal CAD by cath years ago.  No active anginal symptoms.  Remains on statin with an LDL roughly 100.  Blood pressures are stable on ACE inhibitor, calcium channel blocker and beta-blocker combination.  Continue low-dose aspirin prophylaxis -okay to hold for procedures or surgeries.

## 2020-04-09 NOTE — Assessment & Plan Note (Signed)
Pretty much asymptomatic.  Class I symptoms of myalgias.  She does have some edema and takes 20 mg of Lasix.  She is on ACE inhibitor, beta-blocker and calcium channel blocker combination.  No need for changes now.

## 2020-04-09 NOTE — Assessment & Plan Note (Signed)
Target LDL should be about 100 for her just based on her age.  At this point I think continuing current dose of atorvastatin titrating further is reasonable.

## 2020-06-10 ENCOUNTER — Other Ambulatory Visit: Payer: Self-pay | Admitting: Cardiology

## 2020-06-16 ENCOUNTER — Other Ambulatory Visit: Payer: Self-pay | Admitting: Cardiology

## 2020-07-11 ENCOUNTER — Other Ambulatory Visit: Payer: Self-pay | Admitting: Cardiology

## 2020-09-07 DIAGNOSIS — E785 Hyperlipidemia, unspecified: Secondary | ICD-10-CM | POA: Diagnosis not present

## 2020-09-07 DIAGNOSIS — E039 Hypothyroidism, unspecified: Secondary | ICD-10-CM | POA: Diagnosis not present

## 2020-09-07 DIAGNOSIS — E063 Autoimmune thyroiditis: Secondary | ICD-10-CM | POA: Diagnosis not present

## 2020-09-07 DIAGNOSIS — I10 Essential (primary) hypertension: Secondary | ICD-10-CM | POA: Diagnosis not present

## 2020-09-07 DIAGNOSIS — E042 Nontoxic multinodular goiter: Secondary | ICD-10-CM | POA: Diagnosis not present

## 2020-09-09 ENCOUNTER — Other Ambulatory Visit: Payer: Self-pay | Admitting: Cardiology

## 2020-09-14 DIAGNOSIS — E785 Hyperlipidemia, unspecified: Secondary | ICD-10-CM | POA: Diagnosis not present

## 2020-09-14 DIAGNOSIS — E6609 Other obesity due to excess calories: Secondary | ICD-10-CM | POA: Diagnosis not present

## 2020-09-14 DIAGNOSIS — E042 Nontoxic multinodular goiter: Secondary | ICD-10-CM | POA: Diagnosis not present

## 2020-09-14 DIAGNOSIS — K219 Gastro-esophageal reflux disease without esophagitis: Secondary | ICD-10-CM | POA: Diagnosis not present

## 2020-09-14 DIAGNOSIS — E039 Hypothyroidism, unspecified: Secondary | ICD-10-CM | POA: Diagnosis not present

## 2020-09-14 DIAGNOSIS — E063 Autoimmune thyroiditis: Secondary | ICD-10-CM | POA: Diagnosis not present

## 2020-09-14 DIAGNOSIS — E876 Hypokalemia: Secondary | ICD-10-CM | POA: Diagnosis not present

## 2020-09-14 DIAGNOSIS — I10 Essential (primary) hypertension: Secondary | ICD-10-CM | POA: Diagnosis not present

## 2020-10-07 ENCOUNTER — Other Ambulatory Visit: Payer: Self-pay | Admitting: Cardiology

## 2020-10-07 DIAGNOSIS — E663 Overweight: Secondary | ICD-10-CM | POA: Diagnosis not present

## 2020-10-07 DIAGNOSIS — I10 Essential (primary) hypertension: Secondary | ICD-10-CM | POA: Diagnosis not present

## 2020-10-07 DIAGNOSIS — Z0000 Encounter for general adult medical examination without abnormal findings: Secondary | ICD-10-CM | POA: Diagnosis not present

## 2020-10-07 DIAGNOSIS — Z6825 Body mass index (BMI) 25.0-25.9, adult: Secondary | ICD-10-CM | POA: Diagnosis not present

## 2020-10-07 DIAGNOSIS — E063 Autoimmune thyroiditis: Secondary | ICD-10-CM | POA: Diagnosis not present

## 2020-10-07 DIAGNOSIS — M1991 Primary osteoarthritis, unspecified site: Secondary | ICD-10-CM | POA: Diagnosis not present

## 2020-10-07 DIAGNOSIS — Z1331 Encounter for screening for depression: Secondary | ICD-10-CM | POA: Diagnosis not present

## 2020-10-07 DIAGNOSIS — E782 Mixed hyperlipidemia: Secondary | ICD-10-CM | POA: Diagnosis not present

## 2020-12-08 ENCOUNTER — Other Ambulatory Visit: Payer: Self-pay | Admitting: Cardiology

## 2020-12-08 NOTE — Telephone Encounter (Signed)
Rx(s) sent to pharmacy electronically.  

## 2020-12-14 DIAGNOSIS — Z20822 Contact with and (suspected) exposure to covid-19: Secondary | ICD-10-CM | POA: Diagnosis not present

## 2020-12-22 ENCOUNTER — Other Ambulatory Visit (HOSPITAL_COMMUNITY): Payer: Self-pay | Admitting: Hematology

## 2020-12-22 DIAGNOSIS — Z1231 Encounter for screening mammogram for malignant neoplasm of breast: Secondary | ICD-10-CM

## 2020-12-27 ENCOUNTER — Other Ambulatory Visit (HOSPITAL_COMMUNITY): Payer: Self-pay | Admitting: Surgery

## 2020-12-27 DIAGNOSIS — E559 Vitamin D deficiency, unspecified: Secondary | ICD-10-CM

## 2020-12-27 DIAGNOSIS — D520 Dietary folate deficiency anemia: Secondary | ICD-10-CM

## 2020-12-27 DIAGNOSIS — C50912 Malignant neoplasm of unspecified site of left female breast: Secondary | ICD-10-CM

## 2020-12-27 DIAGNOSIS — D518 Other vitamin B12 deficiency anemias: Secondary | ICD-10-CM

## 2020-12-29 ENCOUNTER — Other Ambulatory Visit: Payer: Self-pay

## 2020-12-29 ENCOUNTER — Ambulatory Visit (HOSPITAL_COMMUNITY)
Admission: RE | Admit: 2020-12-29 | Discharge: 2020-12-29 | Disposition: A | Discharge status: Home / Self Care | Payer: Medicare Other | Source: Ambulatory Visit | Attending: Nurse Practitioner | Admitting: Nurse Practitioner

## 2020-12-29 ENCOUNTER — Inpatient Hospital Stay (HOSPITAL_COMMUNITY): Payer: Medicare Other | Attending: Hematology

## 2020-12-29 DIAGNOSIS — E559 Vitamin D deficiency, unspecified: Secondary | ICD-10-CM

## 2020-12-29 DIAGNOSIS — Z9012 Acquired absence of left breast and nipple: Secondary | ICD-10-CM | POA: Diagnosis not present

## 2020-12-29 DIAGNOSIS — C50912 Malignant neoplasm of unspecified site of left female breast: Secondary | ICD-10-CM

## 2020-12-29 DIAGNOSIS — Z853 Personal history of malignant neoplasm of breast: Secondary | ICD-10-CM | POA: Diagnosis not present

## 2020-12-29 DIAGNOSIS — M8580 Other specified disorders of bone density and structure, unspecified site: Secondary | ICD-10-CM | POA: Diagnosis not present

## 2020-12-29 DIAGNOSIS — D518 Other vitamin B12 deficiency anemias: Secondary | ICD-10-CM

## 2020-12-29 DIAGNOSIS — Z1231 Encounter for screening mammogram for malignant neoplasm of breast: Secondary | ICD-10-CM | POA: Diagnosis not present

## 2020-12-29 DIAGNOSIS — I890 Lymphedema, not elsewhere classified: Secondary | ICD-10-CM | POA: Insufficient documentation

## 2020-12-29 DIAGNOSIS — R634 Abnormal weight loss: Secondary | ICD-10-CM | POA: Diagnosis not present

## 2020-12-29 DIAGNOSIS — D649 Anemia, unspecified: Secondary | ICD-10-CM | POA: Insufficient documentation

## 2020-12-29 DIAGNOSIS — D520 Dietary folate deficiency anemia: Secondary | ICD-10-CM

## 2020-12-29 LAB — CBC WITH DIFFERENTIAL/PLATELET
Band Neutrophils: 1 %
Basophils Absolute: 0 10*3/uL (ref 0.0–0.1)
Basophils Relative: 0 %
Eosinophils Absolute: 0.3 10*3/uL (ref 0.0–0.5)
Eosinophils Relative: 5 %
HCT: 37.5 % (ref 36.0–46.0)
Hemoglobin: 11.8 g/dL — ABNORMAL LOW (ref 12.0–15.0)
Lymphocytes Relative: 23 %
Lymphs Abs: 1.5 10*3/uL (ref 0.7–4.0)
MCH: 31 pg (ref 26.0–34.0)
MCHC: 31.5 g/dL (ref 30.0–36.0)
MCV: 98.4 fL (ref 80.0–100.0)
Metamyelocytes Relative: 5 %
Monocytes Absolute: 0.2 10*3/uL (ref 0.1–1.0)
Monocytes Relative: 3 %
Neutro Abs: 4.3 10*3/uL (ref 1.7–7.7)
Neutrophils Relative %: 63 %
Platelets: 161 10*3/uL (ref 150–400)
RBC: 3.81 MIL/uL — ABNORMAL LOW (ref 3.87–5.11)
RDW: 13.7 % (ref 11.5–15.5)
WBC: 6.7 10*3/uL (ref 4.0–10.5)
nRBC: 0 % (ref 0.0–0.2)

## 2020-12-29 LAB — COMPREHENSIVE METABOLIC PANEL
ALT: 10 U/L (ref 0–44)
AST: 16 U/L (ref 15–41)
Albumin: 4.2 g/dL (ref 3.5–5.0)
Alkaline Phosphatase: 49 U/L (ref 38–126)
Anion gap: 13 (ref 5–15)
BUN: 19 mg/dL (ref 8–23)
CO2: 27 mmol/L (ref 22–32)
Calcium: 9.5 mg/dL (ref 8.9–10.3)
Chloride: 96 mmol/L — ABNORMAL LOW (ref 98–111)
Creatinine, Ser: 0.92 mg/dL (ref 0.44–1.00)
GFR, Estimated: >60 mL/min (ref >60)
Glucose, Bld: 105 mg/dL — ABNORMAL HIGH (ref 70–99)
Potassium: 4.2 mmol/L (ref 3.5–5.1)
Sodium: 136 mmol/L (ref 135–145)
Total Bilirubin: 1.5 mg/dL — ABNORMAL HIGH (ref 0.3–1.2)
Total Protein: 7.8 g/dL (ref 6.5–8.1)

## 2020-12-29 LAB — FOLATE: Folate: 14.1 ng/mL (ref >5.9)

## 2020-12-29 LAB — VITAMIN D 25 HYDROXY (VIT D DEFICIENCY, FRACTURES): Vit D, 25-Hydroxy: 12.98 ng/mL — ABNORMAL LOW (ref 30–100)

## 2020-12-29 LAB — VITAMIN B12: Vitamin B-12: 240 pg/mL (ref 180–914)

## 2020-12-29 LAB — LACTATE DEHYDROGENASE: LDH: 142 U/L (ref 98–192)

## 2021-01-03 DIAGNOSIS — Z20822 Contact with and (suspected) exposure to covid-19: Secondary | ICD-10-CM | POA: Diagnosis not present

## 2021-01-03 NOTE — Progress Notes (Signed)
Angelica [name], [place] [address]   CLINIC:  Medical Oncology/Hematology  PCP:  Angelica [name], Angelica [name] / Angelica [name] [phone]   REASON FOR VISIT:  Follow-up for history of multicentric stage I left breast cancer, triple negative  PRIOR THERAPY: Left total mastectomy with sentinel lymph node biopsies (03/21/2010)  NGS Results: None available  CURRENT THERAPY: Surveillance  BRIEF ONCOLOGIC HISTORY:  Oncology History  Breast cancer ([place])  03/14/2011 Initial Diagnosis   Breast cancer ([place])    11/14/2016 Mammogram   BIRADS 1      CANCER STAGING: Cancer Staging No matching staging information was found for the patient.  INTERVAL HISTORY:  [name], a 87 y.o. female, returns for routine follow-up of her left breast cancer. Angelica [name] was last seen on 12/30/2019 by NP Francene Finders.   At today's visit, she reports feeling well.  She denies any recent hospitalizations, surgeries, or changes in her baseline health status.  She continues to have some mild chronic left upper extremity lymphedema, uses compression sleeve at home.  Most recent mammogram on 12/30/2020 showed no mammographic evidence of recurrent malignancy.  She denies any symptoms of recurrence such as new lumps, bone pain, chest pain, dyspnea, or abdominal pain.  She has no new headaches, seizures, or focal neurologic deficits.  No B symptoms such as fever, chills, night sweats.  She has lost about 26 pounds over the past year (40 pounds over the past two years), which she thinks might be because she is "so busy taking care of her husband," although she reports that her oral intake has remained stable and her activity level has only slightly increased in this timeframe.  She reports 100% energy and 100% appetite.    REVIEW OF SYSTEMS:  Review of Systems  Constitutional:  Positive for unexpected weight change (Lost 26 pounds this year). Negative  for appetite change, chills, diaphoresis, fatigue and fever.  HENT:   Positive for lump/mass (Right neck nodule). Negative for nosebleeds.   Eyes:  Negative for eye problems.  Respiratory:  Negative for cough, hemoptysis and shortness of breath.   Cardiovascular:  Positive for palpitations. Negative for chest pain and leg swelling.  Gastrointestinal:  Negative for abdominal pain, blood in stool, constipation, diarrhea, nausea and vomiting.  Genitourinary:  Negative for hematuria.   Skin: Negative.   Neurological:  Negative for dizziness, headaches and light-headedness.  Hematological:  Does not bruise/bleed easily.   PAST MEDICAL/SURGICAL HISTORY:  Past Medical History:  Diagnosis Date   Anemia    Arthritis    Breast cancer ([place]) 2011   left; no adjuvent therapy, Dr. Sonny Dandy   Chronic bronchitis    Colon polyps    Previous colonoscopy by Dr. Irving Shows   Coronary artery disease, non-occlusive 2006   Roughly 40% RCA.; Negative Myoview in June 2013   Diverticulosis of colon    Gout    Hemorrhoids    HTN (hypertension)    Hyperlipidemia    Hypothyroid    MI (myocardial infarction) ([place]) 1973   By report.   Past Surgical History:  Procedure Laterality Date   ABDOMINAL HYSTERECTOMY     TAH   CARDIAC CATHETERIZATION  2006   Roughly 40% RCA lesion, otherwise normal.   CATARACT EXTRACTION W/PHACO  07/02/2011   Procedure: CATARACT EXTRACTION PHACO AND INTRAOCULAR LENS PLACEMENT (IOC);  Surgeon: Williams Che, MD;  Location: [facility];  Service: Ophthalmology;  Laterality: Left;  CDE:3.45  CATARACT EXTRACTION W/PHACO  04/17/2012   Procedure: CATARACT EXTRACTION PHACO AND INTRAOCULAR LENS PLACEMENT (IOC);  Surgeon: Tonny Branch, MD;  Location: [facility];  Service: Ophthalmology;  Laterality: Right;  CDE:13.40   CHOLECYSTECTOMY     COLONOSCOPY      ?3 years ago. Per pt, diverticulosis, hemorrhoids   COLONOSCOPY  04/06/2011   SLF: 1. Sessile polyp at the ileocecal valve 2. Diverticulosis,  moderate 3. Internal hemorrhoids, Large-causing rectal bleeding associated with straining. tubular adenoma.   HEMORRHOID SURGERY     LAPAROSCOPIC LYSIS OF ADHESIONS  2008   Dr. Zella Richer with repair of incisional hernia noted -    MASTECTOMY  2011   left   NM MYOVIEW LTD  June 2013   No ischemia or infarction. Normal EF   TRANSTHORACIC ECHOCARDIOGRAM  June 2013; 12/2013   a) Normal EF, no significant valve disease.; b) EF 50-55%, Gr1 DD, mild LVH.     SOCIAL HISTORY:  Social History   Socioeconomic History   Marital status: Married    Spouse name: Not on file   Number of children: 2   Years of education: Not on file   Highest education level: Not on file  Occupational History   Occupation: retired    Fish farm manager: RETIRED  Tobacco Use   Smoking status: Never   Smokeless tobacco: Never  Substance and Sexual Activity   Alcohol use: No   Drug use: No   Sexual activity: Not Currently  Other Topics Concern   Not on file  Social History Narrative   She is a very pleasant married African American woman with 2 children and 3 grandchildren. She does with her husband who is retired Social research officer, government. They now live on a large farm in the [place] area with a roughly 1/4 mile driveway. She usually walks a mile about twice a day, one by herself and 1 with her husband. She also enjoys doing gardening.   Social Determinants of Health   Financial Resource Strain: Not on file  Food Insecurity: Not on file  Transportation Needs: Not on file  Physical Activity: Not on file  Stress: Not on file  Social Connections: Not on file  Intimate Partner Violence: Not on file    FAMILY HISTORY:  Family History  Problem Relation Age of Onset   Prostate cancer Father    Breast cancer Sister    Breast cancer Other        paternal niece   Colon cancer Neg Hx    Anesthesia problems Neg Hx    Hypotension Neg Hx    Malignant hyperthermia Neg Hx    Pseudochol deficiency Neg Hx    Liver disease Neg Hx      CURRENT MEDICATIONS:  Current Outpatient Medications  Medication Sig Dispense Refill   allopurinol (ZYLOPRIM) 300 MG tablet Take 300 mg by mouth daily.       amLODipine (NORVASC) 5 MG tablet TAKE (1) TABLET BY MOUTH ONCE DAILY. 90 tablet 3   aspirin EC 81 MG tablet Take 81 mg by mouth daily.     atorvastatin (LIPITOR) 20 MG tablet TAKE (1) TABLET BY MOUTH AT BEDTIME FOR CHOLESTEROL. 90 tablet 1   enalapril (VASOTEC) 10 MG tablet Take 10 mg by mouth daily.     furosemide (LASIX) 20 MG tablet Take 20 mg by mouth daily.      levothyroxine (SYNTHROID, LEVOTHROID) 50 MCG tablet Take 1 tablet (50 mcg total) by mouth daily. 90 tablet 3   metoprolol succinate (TOPROL-XL)  50 MG 24 hr tablet Take 50 mg by mouth daily. Take with or immediately following a meal.     Multiple Vitamins-Minerals (MULTIVITAMINS THER. W/MINERALS) TABS Take 1 tablet by mouth daily.       NITROSTAT 0.4 MG SL tablet DISSOLVE 1 TABLET UNDER TONGUE EVERY 5 MINUTES AS NEEDED FOR CHEST PAIN. 25 tablet 7   pantoprazole (PROTONIX) 40 MG tablet TAKE ONE TABLET BY MOUTH DAILY. 90 tablet 1   potassium chloride SA (KLOR-CON) 20 MEQ tablet TAKE ONE TABLET BY MOUTH ONCE DAILY. 90 tablet 1   Propylene Glycol (SYSTANE BALANCE) 0.6 % SOLN Place 1 drop into both eyes 2 (two) times daily.     No current facility-administered medications for this visit.    ALLERGIES:  Allergies  Allergen Reactions   Bee Venom Anaphylaxis   Ciprofloxacin Nausea And Vomiting   Clindamycin Hcl     Does not know   Flagyl [Metronidazole Hcl] Nausea And Vomiting   Shellfish-Derived Products     Rash developed so patient has steered clear of any since last year   Sulfa Antibiotics Nausea Only    PHYSICAL EXAM:  Performance status (ECOG): 0 - Asymptomatic  There were no vitals filed for this visit. Wt Readings from Last 3 Encounters:  04/01/20 141 lb (64 kg)  12/30/19 146 lb 2.6 oz (66.3 kg)  03/26/19 158 lb (71.7 kg)   Physical  Exam Constitutional:      Appearance: Normal appearance.  HENT:     Head: Normocephalic and atraumatic.     Mouth/Throat:     Mouth: Mucous membranes are moist.  Eyes:     Extraocular Movements: Extraocular movements intact.     Pupils: Pupils are equal, round, and reactive to light.  Cardiovascular:     Rate and Rhythm: Normal rate and regular rhythm.     Pulses: Normal pulses.     Heart sounds: Normal heart sounds.  Pulmonary:     Effort: Pulmonary effort is normal.     Breath sounds: Normal breath sounds.  Chest:  Breasts:    Right: No axillary adenopathy or supraclavicular adenopathy.     Left: No axillary adenopathy or supraclavicular adenopathy.       Comments: No discrete nodules or masses palpated in right breast Abdominal:     General: Bowel sounds are normal.     Palpations: Abdomen is soft.     Tenderness: There is no abdominal tenderness.  Musculoskeletal:        General: No swelling.     Right lower leg: No edema.     Left lower leg: No edema.     Comments: Lymphedema of left upper extremity  Lymphadenopathy:     Head:     Right side of head: No submental, submandibular, tonsillar, preauricular, posterior auricular or occipital adenopathy.     Left side of head: No submental, submandibular, tonsillar, preauricular, posterior auricular or occipital adenopathy.     Cervical: Cervical adenopathy present.     Right cervical: Superficial cervical adenopathy present. No deep or posterior cervical adenopathy.    Left cervical: No superficial, deep or posterior cervical adenopathy.     Upper Body:     Right upper body: No supraclavicular or axillary adenopathy.     Left upper body: No supraclavicular or axillary adenopathy.  Skin:    General: Skin is warm and dry.  Neurological:     General: No focal deficit present.     Mental Status: She is alert and  oriented to person, place, and time.  Psychiatric:        Mood and Affect: Mood normal.        Behavior:  Behavior normal.     LABORATORY DATA:  I have reviewed the labs as listed.  CBC Latest Ref Rng & Units 12/29/2020 12/23/2019 08/23/2018  WBC 4.0 - 10.5 K/uL 6.7 7.6 10.1  Hemoglobin 12.0 - 15.0 g/dL 11.8(L) 11.4(L) 12.1  Hematocrit 36.0 - 46.0 % 37.5 36.9 38.2  Platelets 150 - 400 K/uL 161 163 151   CMP Latest Ref Rng & Units 12/29/2020 12/23/2019 08/23/2018  Glucose 70 - 99 mg/dL 105(H) 101(H) 183(H)  BUN 8 - 23 mg/dL '19 22 15  '$ Creatinine 0.44 - 1.00 mg/dL 0.92 0.90 0.78  Sodium 135 - 145 mmol/L 136 138 135  Potassium 3.5 - 5.1 mmol/L 4.2 4.3 4.3  Chloride 98 - 111 mmol/L 96(L) 100 97(L)  CO2 22 - 32 mmol/L '27 29 26  '$ Calcium 8.9 - 10.3 mg/dL 9.5 9.4 9.1  Total Protein 6.5 - 8.1 g/dL 7.8 7.5 -  Total Bilirubin 0.3 - 1.2 mg/dL 1.5(H) 1.3(H) -  Alkaline Phos 38 - 126 U/L 49 55 -  AST 15 - 41 U/L 16 16 -  ALT 0 - 44 U/L 10 12 -    DIAGNOSTIC IMAGING:  I have independently reviewed the scans and discussed with the patient. MM 3D SCREEN BREAST UNI RIGHT  Result Date: 12/30/2020 CLINICAL DATA:  Screening. EXAM: DIGITAL SCREENING UNILATERAL RIGHT MAMMOGRAM WITH CAD AND TOMOSYNTHESIS TECHNIQUE: Right screening digital craniocaudal and mediolateral oblique mammograms were obtained. Right screening digital breast tomosynthesis was performed. The images were evaluated with computer-aided detection. COMPARISON:  Previous exam(s). ACR Breast Density Category b: There are scattered areas of fibroglandular density. FINDINGS: The patient has had a left mastectomy. There are no findings suspicious for malignancy. IMPRESSION: No mammographic evidence of malignancy. A result letter of this screening mammogram will be mailed directly to the patient. RECOMMENDATION: Screening mammogram in one year.  (Code:SM-R-74M) BI-RADS CATEGORY  1: Negative. Electronically Signed   By: Fidela Salisbury M.D.   On: 12/30/2020 15:26     ASSESSMENT & PLAN: 1.  Multicentric stage I left breast cancer, triple negative - S/P  left total mastectomy with sentinel node sampling on 03/21/2010 at which time 0.4 cm and 0.2 cm tumors were found, both triple negative. She received no postoperative therapy. - Most recent mammogram (12/29/2020): BI-RADS Category 1, negative, no mammographic evidence of malignancy - Breast examination today was within normal limits, no discrete nodules or masses palpated on exam  - Most recent labs (12/29/2020): Unremarkable apart from mild anemia with Hgb 11.8/MCV 98.4 - PLAN: RTC in 1 year with repeat mammogram, labs, and office visit.  2.  Vitamin D deficiency: - Most recent labs (12/29/2020) show vitamin D deficiency at 12.98 - She is currently taking vitamin D over-the-counter, but remembers it only 2-3 days per week - PLAN: Prescription sent to start ergocalciferol 50,000 units weekly.  3.  Osteopenia: - DEXA scan on 03/26/2018 showed T score of -1.8. - PLAN: Osteopenia is unrelated to the patient's breast cancer, as she was never on any antiestrogen therapy.  Will defer to PCP for ongoing monitoring and management of bone density.  4.  Unintentional weight loss: - Patient has lost 26 pounds in the past 12 months (40 pounds in the past 2 years) - No obvious reason for weight loss - Patient also has nontender lymphadenopathy of the right  side of her neck, which has been present for several months per her report - PLAN: Symptoms concerning for possible recurrent cancer or new cancerous process.  We will check CT neck soft tissue for evaluation of neck lymphadenopathy.  We will check CT chest/abdomen/pelvis due to unintentional weight loss and history of breast cancer.   PLAN SUMMARY & DISPOSITION: -Start taking vitamin D 50,000 units weekly - CT scan in the next 1 to 2 weeks - RTC after CT scan to discuss results - We will schedule for annual mammogram and follow-up visit after we have reviewed CT scan results.  All questions were answered. The patient knows to call the clinic with any  problems, questions or concerns.  Medical decision making: Moderate  Time spent on visit: I spent 25 minutes counseling the patient face to face. The total time spent in the appointment was 40 minutes and more than 50% was on counseling.   Harriett Rush, PA-C  01/04/2021 9:26 AM

## 2021-01-04 ENCOUNTER — Inpatient Hospital Stay (HOSPITAL_BASED_OUTPATIENT_CLINIC_OR_DEPARTMENT_OTHER): Payer: Medicare Other | Admitting: Physician Assistant

## 2021-01-04 ENCOUNTER — Other Ambulatory Visit: Payer: Self-pay

## 2021-01-04 VITALS — BP 131/71 | HR 88 | Temp 96.9°F | Resp 18 | Wt 120.6 lb

## 2021-01-04 DIAGNOSIS — R590 Localized enlarged lymph nodes: Secondary | ICD-10-CM | POA: Diagnosis not present

## 2021-01-04 DIAGNOSIS — I890 Lymphedema, not elsewhere classified: Secondary | ICD-10-CM | POA: Diagnosis not present

## 2021-01-04 DIAGNOSIS — Z171 Estrogen receptor negative status [ER-]: Secondary | ICD-10-CM

## 2021-01-04 DIAGNOSIS — Z853 Personal history of malignant neoplasm of breast: Secondary | ICD-10-CM | POA: Diagnosis not present

## 2021-01-04 DIAGNOSIS — M8580 Other specified disorders of bone density and structure, unspecified site: Secondary | ICD-10-CM | POA: Diagnosis not present

## 2021-01-04 DIAGNOSIS — R634 Abnormal weight loss: Secondary | ICD-10-CM | POA: Diagnosis not present

## 2021-01-04 DIAGNOSIS — E559 Vitamin D deficiency, unspecified: Secondary | ICD-10-CM

## 2021-01-04 DIAGNOSIS — D649 Anemia, unspecified: Secondary | ICD-10-CM | POA: Diagnosis not present

## 2021-01-04 DIAGNOSIS — C50912 Malignant neoplasm of unspecified site of left female breast: Secondary | ICD-10-CM

## 2021-01-04 MED ORDER — ERGOCALCIFEROL 1.25 MG (50000 UT) PO CAPS
50000.0000 [IU] | ORAL_CAPSULE | ORAL | 11 refills | Status: DC
Start: 2021-01-04 — End: 2022-01-08

## 2021-01-04 NOTE — Patient Instructions (Signed)
[place] [facility] Discharge Instructions  You were seen today by Tarri Abernethy PA-C for your history of breast cancer.    As we discussed, your most recent mammogram did not show any signs of returning cancer in your breast tissue.  However, due to your significant amount of unintentional weight loss, as well as the small nodule on your neck, I would like to check a CT of your neck, chest, abdomen, and pelvis to make sure that you do not have any other disease processes in your body causing this significant weight loss.  Your vitamin D level was low, please start taking prescription strength ergocalciferol 50,000 units once per week prescription has been sent to your preferred pharmacy.  OTHER TESTS: CT neck, chest, abdomen, and pelvis.  MEDICATIONS: Vitamin D 50,000 units once a week  FOLLOW-UP APPOINTMENT: Office visit in 4 weeks (to discuss results of CT scan).  _____________________________________________________________  Thank you for choosing [place] at [facility] to provide your oncology and hematology care.  To afford each patient quality time with our provider, please arrive at least 15 minutes before your scheduled appointment time.   If you have a lab appointment with the [facility] please come in thru the Main Entrance and check in at the main information desk.  You need to re-schedule your appointment should you arrive 10 or more minutes late.  We strive to give you quality time with our providers, and arriving late affects you and other patients whose appointments are after yours.  Also, if you no show three or more times for appointments you may be dismissed from the clinic at the providers discretion.     Again, thank you for choosing [facility].  Our hope is that these requests will decrease the amount of time that you wait before being seen by our physicians.        _____________________________________________________________  Should you have questions after your visit to [facility], please contact our office at [phone] and follow the prompts.  Our office hours are 8:00 a.m. and 4:30 p.m. Monday - Friday.  Please note that voicemails left after 4:00 p.m. may not be returned until the following business day.  We are closed weekends and major holidays.  You do have access to a nurse 24-7, just call the main number to the clinic [phone] and do not press any options, hold on the line and a nurse will answer the phone.    For prescription refill requests, have your pharmacy contact our office and allow 72 hours.    Due to Covid, you will need to wear a mask upon entering the hospital. If you do not have a mask, a mask will be given to you at the Main Entrance upon arrival. For doctor visits, patients may have 1 support person age 67 or older with them. For treatment visits, patients can not have anyone with them due to social distancing guidelines and our immunocompromised population.

## 2021-01-09 DIAGNOSIS — Z20822 Contact with and (suspected) exposure to covid-19: Secondary | ICD-10-CM | POA: Diagnosis not present

## 2021-01-12 ENCOUNTER — Other Ambulatory Visit: Payer: Self-pay

## 2021-01-12 ENCOUNTER — Ambulatory Visit (HOSPITAL_COMMUNITY)
Admission: RE | Admit: 2021-01-12 | Discharge: 2021-01-12 | Disposition: A | Discharge status: Home / Self Care | Payer: Medicare Other | Source: Ambulatory Visit | Attending: Physician Assistant | Admitting: Physician Assistant

## 2021-01-12 DIAGNOSIS — J984 Other disorders of lung: Secondary | ICD-10-CM | POA: Diagnosis not present

## 2021-01-12 DIAGNOSIS — I358 Other nonrheumatic aortic valve disorders: Secondary | ICD-10-CM | POA: Diagnosis not present

## 2021-01-12 DIAGNOSIS — I2510 Atherosclerotic heart disease of native coronary artery without angina pectoris: Secondary | ICD-10-CM | POA: Diagnosis not present

## 2021-01-12 DIAGNOSIS — Z90710 Acquired absence of both cervix and uterus: Secondary | ICD-10-CM | POA: Diagnosis not present

## 2021-01-12 DIAGNOSIS — K5730 Diverticulosis of large intestine without perforation or abscess without bleeding: Secondary | ICD-10-CM | POA: Diagnosis not present

## 2021-01-12 DIAGNOSIS — R634 Abnormal weight loss: Secondary | ICD-10-CM

## 2021-01-12 DIAGNOSIS — R590 Localized enlarged lymph nodes: Secondary | ICD-10-CM | POA: Diagnosis not present

## 2021-01-12 DIAGNOSIS — R1310 Dysphagia, unspecified: Secondary | ICD-10-CM | POA: Diagnosis not present

## 2021-01-12 DIAGNOSIS — Z853 Personal history of malignant neoplasm of breast: Secondary | ICD-10-CM | POA: Diagnosis not present

## 2021-01-12 MED ORDER — IOHEXOL 350 MG/ML SOLN
75.0000 mL | Freq: Once | INTRAVENOUS | Status: AC | PRN
  Administered 2021-01-12: 75 mL via INTRAVENOUS

## 2021-01-16 NOTE — Progress Notes (Signed)
Angelica [name], [place] [address]   CLINIC:  Medical Oncology/Hematology  PCP:  [name], [place] [id number] [phone]   REASON FOR VISIT: Follow-up for history of multicentric stage I left breast cancer, triple negative   PRIOR THERAPY: Left total mastectomy with sentinel lymph node biopsies (03/21/2010)   NGS Results: None available   CURRENT THERAPY: Surveillance  INTERVAL HISTORY:  Angelica [name] 86 y.o. female returns for routine follow-up of her history of triple negative left breast cancer.  She was last seen by Angelica Abernethy PA-C on 01/04/2021.  At last visit, patient was noted to have had significant unintentional weight loss.  Due to her history of triple negative breast cancer, CT scans were obtained, and she returns today to discuss these results.  At today's visit, she reports feeling well.  No recent hospitalizations, surgeries, or changes in baseline health status since her last visit.  No fatigue, fever, chills, shortness of breath, cough, chest pain, nausea, vomiting, abdominal pain.  Denies any signs or symptoms of blood loss.  No current signs or symptoms of blood clots.  She has 100% energy and 100% appetite. She endorses that she is maintaining a stable weight.    REVIEW OF SYSTEMS:  Review of Systems  Constitutional:  Negative for appetite change, chills, diaphoresis, fatigue, fever and unexpected weight change.  HENT:   Positive for trouble swallowing. Negative for lump/mass and nosebleeds.   Eyes:  Negative for eye problems.  Respiratory:  Negative for cough, hemoptysis and shortness of breath.   Cardiovascular:  Negative for chest pain, leg swelling and palpitations.  Gastrointestinal:  Negative for abdominal pain, blood in stool, constipation, diarrhea, nausea and vomiting.  Genitourinary:  Negative for hematuria.   Skin: Negative.   Neurological:  Negative for dizziness,  headaches and light-headedness.  Hematological:  Does not bruise/bleed easily.     PAST MEDICAL/SURGICAL HISTORY:  Past Medical History:  Diagnosis Date   Anemia    Arthritis    Breast cancer ([place]) 2011   left; no adjuvent therapy, Dr. Sonny Dandy   Chronic bronchitis    Colon polyps    Previous colonoscopy by Dr. Irving Shows   Coronary artery disease, non-occlusive 2006   Roughly 40% RCA.; Negative Myoview in June 2013   Diverticulosis of colon    Gout    Hemorrhoids    HTN (hypertension)    Hyperlipidemia    Hypothyroid    MI (myocardial infarction) ([place]) 1973   By report.   Past Surgical History:  Procedure Laterality Date   ABDOMINAL HYSTERECTOMY     TAH   CARDIAC CATHETERIZATION  2006   Roughly 40% RCA lesion, otherwise normal.   CATARACT EXTRACTION W/PHACO  07/02/2011   Procedure: CATARACT EXTRACTION PHACO AND INTRAOCULAR LENS PLACEMENT (IOC);  Surgeon: Williams Che, MD;  Location: [facility];  Service: Ophthalmology;  Laterality: Left;  CDE:3.45   CATARACT EXTRACTION W/PHACO  04/17/2012   Procedure: CATARACT EXTRACTION PHACO AND INTRAOCULAR LENS PLACEMENT (IOC);  Surgeon: Tonny Branch, MD;  Location: [facility];  Service: Ophthalmology;  Laterality: Right;  CDE:13.40   CHOLECYSTECTOMY     COLONOSCOPY      ?3 years ago. Per pt, diverticulosis, hemorrhoids   COLONOSCOPY  04/06/2011   SLF: 1. Sessile polyp at the ileocecal valve 2. Diverticulosis, moderate 3. Internal hemorrhoids, Large-causing rectal bleeding associated with straining. tubular adenoma.   HEMORRHOID SURGERY     LAPAROSCOPIC LYSIS OF ADHESIONS  2008   Dr. Zella Richer with repair of incisional hernia noted -    MASTECTOMY  2011   left   NM MYOVIEW LTD  June 2013   No ischemia or infarction. Normal EF   TRANSTHORACIC ECHOCARDIOGRAM  June 2013; 12/2013   a) Normal EF, no significant valve disease.; b) EF 50-55%, Gr1 DD, mild LVH.      SOCIAL HISTORY:  Social History   Socioeconomic History   Marital status:  Married    Spouse name: Not on file   Number of children: 2   Years of education: Not on file   Highest education level: Not on file  Occupational History   Occupation: retired    Fish farm manager: RETIRED  Tobacco Use   Smoking status: Never   Smokeless tobacco: Never  Substance and Sexual Activity   Alcohol use: No   Drug use: No   Sexual activity: Not Currently  Other Topics Concern   Not on file  Social History Narrative   She is a very pleasant married African American woman with 2 children and 3 grandchildren. She does with her husband who is retired Social research officer, government. They now live on a large farm in the [place] area with a roughly 1/4 mile driveway. She usually walks a mile about twice a day, one by herself and 1 with her husband. She also enjoys doing gardening.   Social Determinants of Health   Financial Resource Strain: Not on file  Food Insecurity: Not on file  Transportation Needs: Not on file  Physical Activity: Not on file  Stress: Not on file  Social Connections: Not on file  Intimate Partner Violence: Not on file    FAMILY HISTORY:  Family History  Problem Relation Age of Onset   Prostate cancer Father    Breast cancer Sister    Breast cancer Other        paternal niece   Colon cancer Neg Hx    Anesthesia problems Neg Hx    Hypotension Neg Hx    Malignant hyperthermia Neg Hx    Pseudochol deficiency Neg Hx    Liver disease Neg Hx     CURRENT MEDICATIONS:  Outpatient Encounter Medications as of 01/17/2021  Medication Sig   allopurinol (ZYLOPRIM) 300 MG tablet Take 300 mg by mouth daily.     amLODipine (NORVASC) 5 MG tablet TAKE (1) TABLET BY MOUTH ONCE DAILY.   aspirin EC 81 MG tablet Take 81 mg by mouth daily.   atorvastatin (LIPITOR) 20 MG tablet TAKE (1) TABLET BY MOUTH AT BEDTIME FOR CHOLESTEROL.   enalapril (VASOTEC) 10 MG tablet Take 10 mg by mouth daily.   ergocalciferol (VITAMIN D2) 1.25 MG (50000 UT) capsule Take 1 capsule (50,000 Units total) by mouth  once a week.   furosemide (LASIX) 20 MG tablet Take 20 mg by mouth daily.    levothyroxine (SYNTHROID, LEVOTHROID) 50 MCG tablet Take 1 tablet (50 mcg total) by mouth daily.   metoprolol succinate (TOPROL-XL) 50 MG 24 hr tablet Take 50 mg by mouth daily. Take with or immediately following a meal.   Multiple Vitamins-Minerals (MULTIVITAMINS THER. W/MINERALS) TABS Take 1 tablet by mouth daily.     NITROSTAT 0.4 MG SL tablet DISSOLVE 1 TABLET UNDER TONGUE EVERY 5 MINUTES AS NEEDED FOR CHEST PAIN. (Patient not taking: Reported on 01/04/2021)   pantoprazole (PROTONIX) 40 MG tablet TAKE ONE TABLET BY MOUTH DAILY.   potassium chloride SA (KLOR-CON) 20 MEQ tablet TAKE ONE TABLET BY MOUTH ONCE DAILY.  Propylene Glycol 0.6 % SOLN Place 1 drop into both eyes 2 (two) times daily.   No facility-administered encounter medications on file as of 01/17/2021.    ALLERGIES:  Allergies  Allergen Reactions   Bee Venom Anaphylaxis   Ciprofloxacin Nausea And Vomiting   Clindamycin Hcl     Does not know   Flagyl [Metronidazole Hcl] Nausea And Vomiting   Shellfish-Derived Products     Rash developed so patient has steered clear of any since last year   Sulfa Antibiotics Nausea Only     PHYSICAL EXAM:  ECOG PERFORMANCE STATUS: 0 - Asymptomatic  There were no vitals filed for this visit. There were no vitals filed for this visit. Physical Exam Constitutional:      Appearance: Normal appearance.  HENT:     Head: Normocephalic and atraumatic.     Mouth/Throat:     Mouth: Mucous membranes are moist.  Eyes:     Extraocular Movements: Extraocular movements intact.     Pupils: Pupils are equal, round, and reactive to light.  Cardiovascular:     Rate and Rhythm: Normal rate and regular rhythm.     Pulses: Normal pulses.     Heart sounds: Normal heart sounds.  Pulmonary:     Effort: Pulmonary effort is normal.     Breath sounds: Normal breath sounds.  Abdominal:     General: Bowel sounds are normal.      Palpations: Abdomen is soft.     Tenderness: There is no abdominal tenderness.  Musculoskeletal:        General: No swelling.     Right lower leg: 1+ Edema present.     Left lower leg: 1+ Edema present.  Lymphadenopathy:     Cervical: No cervical adenopathy.  Skin:    General: Skin is warm and dry.  Neurological:     General: No focal deficit present.     Mental Status: She is alert and oriented to person, place, and time.  Psychiatric:        Mood and Affect: Mood normal.        Behavior: Behavior normal.     LABORATORY DATA:  I have reviewed the labs as listed.  CBC    Component Value Date/Time   WBC 6.7 12/29/2020 1046   RBC 3.81 (L) 12/29/2020 1046   HGB 11.8 (L) 12/29/2020 1046   HCT 37.5 12/29/2020 1046   PLT 161 12/29/2020 1046   MCV 98.4 12/29/2020 1046   MCH 31.0 12/29/2020 1046   MCHC 31.5 12/29/2020 1046   RDW 13.7 12/29/2020 1046   LYMPHSABS 1.5 12/29/2020 1046   MONOABS 0.2 12/29/2020 1046   EOSABS 0.3 12/29/2020 1046   BASOSABS 0.0 12/29/2020 1046   CMP Latest Ref Rng & Units 12/29/2020 12/23/2019 08/23/2018  Glucose 70 - 99 mg/dL 105(H) 101(H) 183(H)  BUN 8 - 23 mg/dL '19 22 15  '$ Creatinine 0.44 - 1.00 mg/dL 0.92 0.90 0.78  Sodium 135 - 145 mmol/L 136 138 135  Potassium 3.5 - 5.1 mmol/L 4.2 4.3 4.3  Chloride 98 - 111 mmol/L 96(L) 100 97(L)  CO2 22 - 32 mmol/L '27 29 26  '$ Calcium 8.9 - 10.3 mg/dL 9.5 9.4 9.1  Total Protein 6.5 - 8.1 g/dL 7.8 7.5 -  Total Bilirubin 0.3 - 1.2 mg/dL 1.5(H) 1.3(H) -  Alkaline Phos 38 - 126 U/L 49 55 -  AST 15 - 41 U/L 16 16 -  ALT 0 - 44 U/L 10 12 -  DIAGNOSTIC IMAGING:  I have independently reviewed the relevant imaging and discussed with the patient.  ASSESSMENT & PLAN: 1.  Unintentional weight loss: - Patient has lost 26 pounds in the past 12 months (40 pounds in the past 2 years) - No obvious reason for weight loss, although she reports that she is "very active taking care of her husband." - Patient also has  nontender lymphadenopathy of the right side of her neck, which has been present for several months per her report - CT chest abdomen and pelvis (01/12/2021): No CT findings of chest, abdomen, or pelvis to explain weight loss; no specific evidence of malignant recurrence, lymphadenopathy, or metastatic disease; small bilateral pulmonary nodules measuring 3 mm or smaller -CT soft tissue neck: Bilateral subcentimeter cervical lymph nodes, measuring up to 7 mm; no abnormal lymph nodes by density or CT size criteria - PLAN: No concern for malignant weight loss at this time.  Patient declines referral to nutritionist at this time.  2.   Multicentric stage I left breast cancer, triple negative - S/P left total mastectomy with sentinel node sampling on 03/21/2010 at which time 0.4 cm and 0.2 cm tumors were found, both triple negative. She received no postoperative therapy. - Most recent mammogram (12/29/2020): BI-RADS Category 1, negative, no mammographic evidence of malignancy - Breast examination today was within normal limits, no discrete nodules or masses palpated on exam  - Most recent labs (12/29/2020): Unremarkable apart from mild anemia with Hgb 11.8/MCV 98.4 - PLAN: RTC in 1 year with repeat mammogram, labs, and office visit.  3.  Vitamin D deficiency: - Most recent labs (12/29/2020) show vitamin D deficiency at 12.98 - She is currently taking vitamin D over-the-counter, but remembers it only 2-3 days per week - PLAN: Prescription sent at last visit to start ergocalciferol 50,000 units weekly.   4.  Osteopenia: - DEXA scan on 03/26/2018 showed T score of -1.8. - PLAN: Osteopenia is unrelated to the patient's breast cancer, as she was never on any antiestrogen therapy.  Will defer to PCP for ongoing monitoring and management of bone density.   PLAN SUMMARY & DISPOSITION: - Referral to nutritionist. - Labs and mammogram in 1 year (July 2023) - RTC in 1 year  All questions were answered. The  patient knows to call the clinic with any problems, questions or concerns.  Medical decision making: Low  Time spent on visit: I spent 15 minutes counseling the patient face to face. The total time spent in the appointment was 25 minutes and more than 50% was on counseling.   Harriett Rush, PA-C  01/17/2021 10:54 AM

## 2021-01-17 ENCOUNTER — Inpatient Hospital Stay (HOSPITAL_COMMUNITY): Payer: Medicare Other | Attending: Hematology | Admitting: Physician Assistant

## 2021-01-17 ENCOUNTER — Other Ambulatory Visit: Payer: Self-pay

## 2021-01-17 VITALS — BP 110/58 | HR 61 | Temp 96.6°F | Resp 17 | Wt 118.8 lb

## 2021-01-17 DIAGNOSIS — Z853 Personal history of malignant neoplasm of breast: Secondary | ICD-10-CM | POA: Diagnosis not present

## 2021-01-17 DIAGNOSIS — Z86010 Personal history of colonic polyps: Secondary | ICD-10-CM | POA: Diagnosis not present

## 2021-01-17 DIAGNOSIS — M109 Gout, unspecified: Secondary | ICD-10-CM | POA: Diagnosis not present

## 2021-01-17 DIAGNOSIS — R634 Abnormal weight loss: Secondary | ICD-10-CM | POA: Diagnosis not present

## 2021-01-17 DIAGNOSIS — E785 Hyperlipidemia, unspecified: Secondary | ICD-10-CM | POA: Diagnosis not present

## 2021-01-17 DIAGNOSIS — E559 Vitamin D deficiency, unspecified: Secondary | ICD-10-CM

## 2021-01-17 DIAGNOSIS — Z8042 Family history of malignant neoplasm of prostate: Secondary | ICD-10-CM | POA: Diagnosis not present

## 2021-01-17 DIAGNOSIS — C50912 Malignant neoplasm of unspecified site of left female breast: Secondary | ICD-10-CM | POA: Diagnosis not present

## 2021-01-17 DIAGNOSIS — I10 Essential (primary) hypertension: Secondary | ICD-10-CM | POA: Diagnosis not present

## 2021-01-17 DIAGNOSIS — M8580 Other specified disorders of bone density and structure, unspecified site: Secondary | ICD-10-CM | POA: Insufficient documentation

## 2021-01-17 DIAGNOSIS — Z1231 Encounter for screening mammogram for malignant neoplasm of breast: Secondary | ICD-10-CM

## 2021-01-17 DIAGNOSIS — E039 Hypothyroidism, unspecified: Secondary | ICD-10-CM | POA: Diagnosis not present

## 2021-01-17 DIAGNOSIS — M129 Arthropathy, unspecified: Secondary | ICD-10-CM | POA: Diagnosis not present

## 2021-01-17 DIAGNOSIS — Z79899 Other long term (current) drug therapy: Secondary | ICD-10-CM | POA: Diagnosis not present

## 2021-01-17 DIAGNOSIS — I252 Old myocardial infarction: Secondary | ICD-10-CM | POA: Diagnosis not present

## 2021-01-17 DIAGNOSIS — Z803 Family history of malignant neoplasm of breast: Secondary | ICD-10-CM | POA: Insufficient documentation

## 2021-01-17 DIAGNOSIS — Z9012 Acquired absence of left breast and nipple: Secondary | ICD-10-CM | POA: Insufficient documentation

## 2021-01-17 DIAGNOSIS — I2510 Atherosclerotic heart disease of native coronary artery without angina pectoris: Secondary | ICD-10-CM | POA: Insufficient documentation

## 2021-01-17 DIAGNOSIS — Z7982 Long term (current) use of aspirin: Secondary | ICD-10-CM | POA: Diagnosis not present

## 2021-01-17 DIAGNOSIS — D649 Anemia, unspecified: Secondary | ICD-10-CM | POA: Diagnosis not present

## 2021-01-17 NOTE — Patient Instructions (Signed)
[place] at [facility] Discharge Instructions  You were seen today by Tarri Abernethy PA-C for your history of breast cancer and your recent unintentional weight loss.  The CT scans did not show any sign of recurrence of malignancy (cancer), so we assume that your weight loss is due to inadequate dietary intake and the increased activity that you described.  Recommend that you focus on nutrition in the months ahead and are careful not to lose any additional weight.  Regarding her history of breast cancer, we will repeat your mammogram in 1 year and will see you for repeat labs and follow-up at that time.  LABS: Return in 1 year for repeat labs  OTHER TESTS: Mammogram in 1 year  MEDICATIONS: Continue taking vitamin D (ergocalciferol) 50,000 units weekly  FOLLOW-UP APPOINTMENT: Office visit in 1 year   Thank you for choosing [place] [facility] to provide your oncology and hematology care.  To afford each patient quality time with our provider, please arrive at least 15 minutes before your scheduled appointment time.   If you have a lab appointment with the [facility] please come in thru the Main Entrance and check in at the main information desk.  You need to re-schedule your appointment should you arrive 10 or more minutes late.  We strive to give you quality time with our providers, and arriving late affects you and other patients whose appointments are after yours.  Also, if you no show three or more times for appointments you may be dismissed from the clinic at the providers discretion.     Again, thank you for choosing [facility].  Our hope is that these requests will decrease the amount of time that you wait before being seen by our physicians.       _____________________________________________________________  Should you have questions after your visit to [facility], please contact our office at  [phone] and follow the prompts.  Our office hours are 8:00 a.m. and 4:30 p.m. Monday - Friday.  Please note that voicemails left after 4:00 p.m. may not be returned until the following business day.  We are closed weekends and major holidays.  You do have access to a nurse 24-7, just call the main number to the clinic [phone] and do not press any options, hold on the line and a nurse will answer the phone.    For prescription refill requests, have your pharmacy contact our office and allow 72 hours.    Due to Covid, you will need to wear a mask upon entering the hospital. If you do not have a mask, a mask will be given to you at the Main Entrance upon arrival. For doctor visits, patients may have 1 support person age 54 or older with them. For treatment visits, patients can not have anyone with them due to social distancing guidelines and our immunocompromised population.

## 2021-02-22 DIAGNOSIS — Z20822 Contact with and (suspected) exposure to covid-19: Secondary | ICD-10-CM | POA: Diagnosis not present

## 2021-03-08 ENCOUNTER — Other Ambulatory Visit: Payer: Self-pay | Admitting: Cardiology

## 2021-04-10 ENCOUNTER — Telehealth: Payer: Medicare Other | Admitting: Cardiology

## 2021-04-15 DIAGNOSIS — Z20822 Contact with and (suspected) exposure to covid-19: Secondary | ICD-10-CM | POA: Diagnosis not present

## 2021-04-20 ENCOUNTER — Other Ambulatory Visit: Payer: Self-pay | Admitting: Cardiology

## 2021-05-18 DIAGNOSIS — Z20822 Contact with and (suspected) exposure to covid-19: Secondary | ICD-10-CM | POA: Diagnosis not present

## 2021-05-22 DIAGNOSIS — Z23 Encounter for immunization: Secondary | ICD-10-CM | POA: Diagnosis not present

## 2021-05-26 ENCOUNTER — Telehealth: Payer: Self-pay | Admitting: *Deleted

## 2021-05-26 ENCOUNTER — Telehealth (INDEPENDENT_AMBULATORY_CARE_PROVIDER_SITE_OTHER): Payer: Medicare Other | Admitting: Cardiology

## 2021-05-26 ENCOUNTER — Encounter: Payer: Self-pay | Admitting: Cardiology

## 2021-05-26 ENCOUNTER — Other Ambulatory Visit: Payer: Self-pay

## 2021-05-26 VITALS — Ht 59.0 in | Wt 100.0 lb

## 2021-05-26 DIAGNOSIS — E782 Mixed hyperlipidemia: Secondary | ICD-10-CM

## 2021-05-26 DIAGNOSIS — I119 Hypertensive heart disease without heart failure: Secondary | ICD-10-CM | POA: Diagnosis not present

## 2021-05-26 DIAGNOSIS — I2510 Atherosclerotic heart disease of native coronary artery without angina pectoris: Secondary | ICD-10-CM | POA: Diagnosis not present

## 2021-05-26 DIAGNOSIS — R079 Chest pain, unspecified: Secondary | ICD-10-CM

## 2021-05-26 MED ORDER — NITROGLYCERIN 0.4 MG SL SUBL: SUBLINGUAL_TABLET | SUBLINGUAL | 4 refills | Status: DC

## 2021-05-26 NOTE — Assessment & Plan Note (Signed)
I do not have her most recent labs available.  We can review once we see the results of her stress test.  Our previous goal LDL was less than 100.  If stress test is abnormal, would probably target more aggressive management.  For now continue current dose of atorvastatin.

## 2021-05-26 NOTE — Progress Notes (Signed)
Virtual Visit via Telephone Note   This visit type was conducted due to national recommendations for restrictions regarding the COVID-19 Pandemic (e.g. social distancing) in an effort to limit this patient's exposure and mitigate transmission in our community.  Due to her co-morbid illnesses, this patient is at least at moderate risk for complications without adequate follow up.  This format is felt to be most appropriate for this patient at this time.  The patient did not have access to video technology/had technical difficulties with video requiring transitioning to audio format only (telephone).  All issues noted in this document were discussed and addressed.  No physical exam could be performed with this format.  Please refer to the patient's chart for her  consent to telehealth for [facility].   Patient has given verbal permission to conduct this visit via virtual appointment and to bill insurance 05/26/2021 6:22 PM     Evaluation Performed:  Follow-up visit  Date:  05/26/2021   ID:  ADEL NEYER, DOB 1932/06/17, MRN [id number]  Patient Location: Home Provider Location: Home Office  PCP:  [name], MD  Cardiologist:  Glenetta Hew, MD  Electrophysiologist:  None   Chief Complaint:   Chief Complaint  Patient presents with   Chest Pain    Has been under lots of stress.  Husband's health.   ====================================  ASSESSMENT & PLAN:    Problem List Items Addressed This Visit       Cardiology Problems   Hypertensive cardiovascular disease- mild LVH, grade 1 diastolic dysfunction (Chronic)    Her blood pressures have been usually pretty well controlled on current medications.  She takes an ACE inhibitor, beta-blocker and calcium channel blocker.  I do not have blood pressure recordings for today.  She and her husband both misunderstood that the appointment today was not in person.  They were on their way driving down to [place].  Therefore, they did  not have time to check your vital signs and are probably more elevated because of the travel.  We can reassess when we see her back in follow-up to discuss results of her test.  She had a possible follow-up in the [facility] [facility]-hoping for 4 PM and 4:20 PM time slot to ensure that they are not canceled by potential Procedures.      Relevant Medications   nitroGLYCERIN (NITROSTAT) 0.4 MG SL tablet   Other Relevant Orders   MYOCARDIAL PERFUSION IMAGING   Coronary artery disease, non-occlusive (Chronic)    Evidence of minimal CAD years ago by cardiac catheterization.  Has been doing relatively well with no significant symptoms for quite a long time.  She has been maintained on combination ACE inhibitor, Calcitrel blocker and beta-blocker along with statin.  She now is complaining of the intermittent episodes of chest pain that is associated with stress (both physical and emotional).  She is the primary caregiver for her husband, [name] who is 9 years older than her and is starting to have failing health.  Her daughter is worried that she herself is now not taking care of herself enough to be take care of her husband.  As such, after discussion with Jisel and her daughter, we felt it was best to proceed with ischemic evaluation with [facility].      Relevant Medications   nitroGLYCERIN (NITROSTAT) 0.4 MG SL tablet   Other Relevant Orders   Cardiac Stress Test: Informed Consent Details: Physician/Practitioner Attestation; Transcribe to consent form and obtain patient signature  MYOCARDIAL PERFUSION IMAGING   Hyperlipidemia (Chronic)    I do not have her most recent labs available.  We can review once we see the results of her stress test.  Our previous goal LDL was less than 100.  If stress test is abnormal, would probably target more aggressive management.  For now continue current dose of atorvastatin.      Relevant Medications   nitroGLYCERIN (NITROSTAT) 0.4 MG SL tablet    Other Relevant Orders   Cardiac Stress Test: Informed Consent Details: Physician/Practitioner Attestation; Transcribe to consent form and obtain patient signature     Other   Chest pain with moderate risk for cardiac etiology - Primary    Your chest pain seems like a pressure and tightness that is worse with both emotional and physical stress.  Most likely related to the stress of her husband's health, but based on the importance of her level of health and caring for her husband, we felt it is best to exclude ischemic CAD as a reversible/treatable cause for her chest pain.  Plan: Lexiscan Myoview. See shared decision making/informed consent below      Relevant Orders   Cardiac Stress Test: Informed Consent Details: Physician/Practitioner Attestation; Transcribe to consent form and obtain patient signature   MYOCARDIAL PERFUSION IMAGING    ====================================  History of Present Illness:    KAYLI BEAL is a 87 y.o. female with PMH notable for HYPERTENSION/HYPERTENSIVE HEART DISEASE, and Nonocclusive CAD by CATH who presents via audio/Phone conferencing for a telehealth visit today as a annual follow-up to discuss chest pain.  Love Chowning Penix was last seen in person on 04/01/2020.  She is usual is accompanied by her husband- [name] [name]), who is also a patient of mine.   She has a history of cardiac arrest in the distal past following partial thyroidectomy but has not had any cardiac issues since.  Had a heart cath in 2006 showing minimal CAD.  Most recent echocardiogram was in 2016 that was normal.  She is also had a nonischemic Myoview.  She was doing well.  Blood pressures were stable.  Mild edema for which she wears support stockings. No medication changes  Hospitalizations:  None   Recent - Interim CV studies:   The following studies were reviewed today: CT Chest/Abdomen/Pelvis 01/12/2021:: Aortic calcification.  Three-vessel coronary calcification.  Inerval  History   SIREN PORRATA is being evaluated today because of complaints of chest pain.  She was due for routine annual follow-up.  She and her husband were coming in together.  They actually were on their way driving into the [place] office, only to be contacted to inform that this was a virtual visit as opposed to in person.  Because of her having chest pain we decided to talk via telephone.  She had been doing okay but for the last week or 2, [name] has been having a slight decline in function.  He has not been sleeping well, has been up most of the night.  This is caused her to wake up.  She often wakes up startled and when she gets going she starts having chest pain.  She has been also having episodes of chest pain during the course of the day they can occur both at rest and with exertion.  She indicates that she has been under a lot of stress, both not getting enough sleep and with concerned about her husband's health.  He has been having multiple falls where his legs just  give out on him.  This is been on difficult issue to manage as far as diagnostic purposes but also very difficult for her because of her concern about him falling and getting hurt.  She says that she will feel palpitations off and on but nothing this long enough to be concerned about.  Nothing that would suggest an arrhythmia.  No heart failure symptoms of PND orthopnea or edema.  No TIA or amaurosis fugax symptoms.  No claudication.  ROS:  Please see the history of present illness.     Review of Systems  Constitutional:  Positive for malaise/fatigue (Really tired because she has not been getting enough sleep lately-waking up at all times at night to take care of her husband.). Negative for weight loss.  Respiratory:  Negative for cough and shortness of breath.   Cardiovascular:  Positive for chest pain and palpitations.  Gastrointestinal:  Negative for blood in stool and melena.  Genitourinary:  Negative for hematuria.   Musculoskeletal:  Positive for back pain and joint pain. Negative for neck pain.  Neurological:  Negative for dizziness and focal weakness.  Psychiatric/Behavioral:  Negative for depression and memory loss. The patient is not nervous/anxious and does not have insomnia.    Past Medical History:  Diagnosis Date   Anemia    Arthritis    Breast cancer ([place]) 2011   left; no adjuvent therapy, Dr. Sonny Dandy   Chronic bronchitis    Colon polyps    Previous colonoscopy by Dr. Irving Shows   Coronary artery disease, non-occlusive 2006   Roughly 40% RCA.; Negative Myoview in June 2013   Diverticulosis of colon    Gout    Hemorrhoids    HTN (hypertension)    Hyperlipidemia    Hypothyroid    MI (myocardial infarction) ([place]) 1973   By report.   Past Surgical History:  Procedure Laterality Date   ABDOMINAL HYSTERECTOMY     TAH   CARDIAC CATHETERIZATION  2006   Roughly 40% RCA lesion, otherwise normal.   CATARACT EXTRACTION W/PHACO  07/02/2011   Procedure: CATARACT EXTRACTION PHACO AND INTRAOCULAR LENS PLACEMENT (IOC);  Surgeon: Williams Che, MD;  Location: [facility];  Service: Ophthalmology;  Laterality: Left;  CDE:3.45   CATARACT EXTRACTION W/PHACO  04/17/2012   Procedure: CATARACT EXTRACTION PHACO AND INTRAOCULAR LENS PLACEMENT (IOC);  Surgeon: Tonny Branch, MD;  Location: [facility];  Service: Ophthalmology;  Laterality: Right;  CDE:13.40   CHOLECYSTECTOMY     COLONOSCOPY      ?3 years ago. Per pt, diverticulosis, hemorrhoids   COLONOSCOPY  04/06/2011   SLF: 1. Sessile polyp at the ileocecal valve 2. Diverticulosis, moderate 3. Internal hemorrhoids, Large-causing rectal bleeding associated with straining. tubular adenoma.   HEMORRHOID SURGERY     LAPAROSCOPIC LYSIS OF ADHESIONS  2008   Dr. Zella Richer with repair of incisional hernia noted -    MASTECTOMY  2011   left   NM MYOVIEW LTD  June 2013   No ischemia or infarction. Normal EF   TRANSTHORACIC ECHOCARDIOGRAM  June 2013; 12/2013   a)  Normal EF, no significant valve disease.; b) EF 50-55%, Gr1 DD, mild LVH.      Current Meds  Medication Sig   allopurinol (ZYLOPRIM) 300 MG tablet Take 300 mg by mouth daily.     amLODipine (NORVASC) 5 MG tablet TAKE (1) TABLET BY MOUTH ONCE DAILY.   aspirin EC 81 MG tablet Take 81 mg by mouth daily.   atorvastatin (LIPITOR) 20 MG tablet  TAKE (1) TABLET BY MOUTH AT BEDTIME FOR CHOLESTEROL.   enalapril (VASOTEC) 10 MG tablet Take 10 mg by mouth daily.   ergocalciferol (VITAMIN D2) 1.25 MG (50000 UT) capsule Take 1 capsule (50,000 Units total) by mouth once a week.   furosemide (LASIX) 20 MG tablet Take 20 mg by mouth daily.    levothyroxine (SYNTHROID, LEVOTHROID) 50 MCG tablet Take 1 tablet (50 mcg total) by mouth daily.   metoprolol succinate (TOPROL-XL) 50 MG 24 hr tablet Take 50 mg by mouth daily. Take with or immediately following a meal.   Multiple Vitamins-Minerals (MULTIVITAMINS THER. W/MINERALS) TABS Take 1 tablet by mouth daily.     pantoprazole (PROTONIX) 40 MG tablet TAKE ONE TABLET BY MOUTH DAILY.   potassium chloride SA (KLOR-CON) 20 MEQ tablet TAKE ONE TABLET BY MOUTH ONCE DAILY.   Propylene Glycol 0.6 % SOLN Place 1 drop into both eyes 2 (two) times daily.   [DISCONTINUED] NITROSTAT 0.4 MG SL tablet DISSOLVE 1 TABLET UNDER TONGUE EVERY 5 MINUTES AS NEEDED FOR CHEST PAIN.     Allergies:   Bee venom, Ciprofloxacin, Clindamycin hcl, Flagyl [metronidazole hcl], Shellfish-derived products, and Sulfa antibiotics   Social History   Tobacco Use   Smoking status: Never   Smokeless tobacco: Never  Substance Use Topics   Alcohol use: No   Drug use: No     Family Hx: The patient's family history includes Breast cancer in her sister and another family member; Prostate cancer in her father. There is no history of Colon cancer, Anesthesia problems, Hypotension, Malignant hyperthermia, Pseudochol deficiency, or Liver disease.   Labs/Other Tests and Data Reviewed:    EKG:  No ECG  reviewed.  Recent Labs: 12/29/2020: ALT 10; BUN 19; Creatinine, Ser 0.92; Hemoglobin 11.8; Platelets 161; Potassium 4.2; Sodium 136   Recent Lipid Panel No results found for: CHOL, TRIG, HDL, CHOLHDL, LDLCALC, LDLDIRECT  Wt Readings from Last 3 Encounters:  05/26/21 100 lb (45.4 kg)  01/17/21 118 lb 12.8 oz (53.9 kg)  01/04/21 120 lb 9.5 oz (54.7 kg)     Objective:    Vital Signs:  Ht 4\' 11"  (1.499 m)    Wt 100 lb (45.4 kg)    BMI 20.20 kg/m   VITAL SIGNS:  reviewed GEN:  no acute distress RESPIRATORY:  normal effort NEURO:  A&O x 3 PSYCH:  normal affect   ==========================================  COVID-19 Education: The signs and symptoms of COVID-19 were discussed with the patient and how to seek care for testing (follow up with PCP or arrange E-visit).   The importance of social distancing was discussed today.  Time:   Today, I have spent 18 minutes with the patient with telehealth technology discussing the above problems.   An additional 14minutes spent charting (reviewing prior notes, hospital records, studies, labs etc.) Total 26 minutes   Medication Adjustments/Labs and Tests Ordered: Current medicines are reviewed at length with the patient today.  Concerns regarding medicines are outlined above.   Shared Decision Making/Informed Consent The risks [chest pain, shortness of breath, cardiac arrhythmias, dizziness, blood pressure fluctuations, myocardial infarction, stroke/transient ischemic attack, nausea, vomiting, allergic reaction, radiation exposure, metallic taste sensation and life-threatening complications (estimated to be 1 in 10,000)], benefits (risk stratification, diagnosing coronary artery disease, treatment guidance) and alternatives of a nuclear stress test were discussed in detail with Ms. Schnitker and she agrees to proceed.   Patient Instructions  Medication Instructions:   NONE  *If you need a refill on your cardiac medications  before your next  appointment, please call your pharmacy*   Lab Work: NONE     Testing/Procedures:  Lexicographer [employer])  will be schedule at [employer] [facility]. Your doctor has scheduled you for a Myocardial Perfusion scan ( Lexiscan Myoview) to obtain information about the blood flow to your heart. The test consists of taking pictures of your heart in two phases: while resting and after a stress test.  You will be given a drug intended to have a similar effect on the heart to that of exercise.  The test will take approximately 3 to 4  hours to complete.    Follow-Up: At [facility], you and your health needs are our priority.  As part of our continuing mission to provide you with exceptional heart care, we have created designated Provider Care Teams.  These Care Teams include your primary Cardiologist (physician) and Advanced Practice Providers (APPs -  Physician Assistants and Nurse Practitioners) who all work together to provide you with the care you need, when you need it.  We recommend signing up for the patient portal called "MyChart".  Sign up information is provided on this After Visit Summary.  MyChart is used to connect with patients for Virtual Visits (Telemedicine).  Patients are able to view lab/test results, encounter notes, upcoming appointments, etc.  Non-urgent messages can be sent to your provider as well.   To learn more about what you can do with MyChart, go to [email or web address].    Your next appointment:   4 week(s)  The format for your next appointment:   In Person  Provider:   Glenetta Hew, MD  -- Pls Schedule for 4 PM & 4:20 PM for her Husband Darlynn Ricco.  Other Instructions MERRY CHRISTMAS!!    Instruction for Myoview  Your doctor has scheduled you for a Myocardial Perfusion scan [facility] Myoview) to obtain information about the blood flow to your heart. The test consists of taking pictures of your heart in two phases:  while resting and after a stress test.  You will be given a drug intended to have a similar effect on the heart to that of exercise.  The test will take approximately 3 to 4  hours to complete.   How to prepare for your test: Do not eat or drink 2 hours prior to your test Do not consume products containing caffeine 12 hours prior to your test (examples: coffee (regular OR decaf), chocolate, sodas, tea) Your doctor may need you to hold certain medications prior to the test.  If so, these are listed below and should not be taken for 24 hours prior to the test.  If not listed below, you may take your medications as normal.  You may resume taking held medications on your normal schedule once the test is complete.   Meds to hold: none Do bring a list of your current medications with you.  If you have held any meds in preparation for the test, please bring them, as you may be required to take them once the test is completed. Do wear comfortable clothes and walking shoes.  Do not wear dresses or overalls. Do NOT wear cologne, perfume, aftershave, or fragranced lotions the day of your test (deodorants okay). If these instructions are not followed your test will have to be rescheduled.   A nuclear cardiologist will review your test, prepare a report and send it to your physician.   If you have questions or concerns  about your appointment, you can call the Nuclear Cardiology department at [phone]. If you cannot keep your appointment, please provide 48 hours notification to avoid a possible $50.00 charge to your account.   Please arrive 15 minutes prior to your appointment time for registration and insurance purposes     Signed, Glenetta Hew, MD  05/26/2021 6:22 PM    [place]

## 2021-05-26 NOTE — Assessment & Plan Note (Addendum)
Your chest pain seems like a pressure and tightness that is worse with both emotional and physical stress.  Most likely related to the stress of her husband's health, but based on the importance of her level of health and caring for her husband, we felt it is best to exclude ischemic CAD as a reversible/treatable cause for her chest pain.  Plan: Lexiscan Myoview. See shared decision making/informed consent below

## 2021-05-26 NOTE — Telephone Encounter (Signed)
Called patient . Son -in law answered. Mr Angelica [name]  he states the patient and Husband is heading for Dr harding office for a office visit.  RN informed Mr Angelica [name] that  their appointment was a virtual appointment not in person.  Dr Ellyn Hack is not actually at the office today. Please call his wife to let them know.  Mr Angelica [name] stated he would

## 2021-05-26 NOTE — Assessment & Plan Note (Addendum)
Her blood pressures have been usually pretty well controlled on current medications.  She takes an ACE inhibitor, beta-blocker and calcium channel blocker.  I do not have blood pressure recordings for today.  She and her husband both misunderstood that the appointment today was not in person.  They were on their way driving down to [place].  Therefore, they did not have time to check your vital signs and are probably more elevated because of the travel.  We can reassess when we see her back in follow-up to discuss results of her test.  She had a possible follow-up in the [facility] Office-hoping for 4 PM and 4:20 PM time slot to ensure that they are not canceled by potential Procedures.

## 2021-05-26 NOTE — Telephone Encounter (Signed)
Called [name]. . Instruction were given  from today's virtual visit 05/26/21 .  AVS SUMMARY has been sent by mychart .  Mailed with future appointment reminder in  Jan 2023 for patient and husband Angelica [name]. Patient aware  a follow up appointment will be in [place].    Patient verbalized understanding

## 2021-05-26 NOTE — Patient Instructions (Addendum)
Medication Instructions:   NONE  *If you need a refill on your cardiac medications before your next appointment, please call your pharmacy*   Lab Work: NONE     Testing/Procedures:  Lexicographer [employer])  will be schedule at [employer] [facility]. Your doctor has scheduled you for a Myocardial Perfusion scan ( Lexiscan Myoview) to obtain information about the blood flow to your heart. The test consists of taking pictures of your heart in two phases: while resting and after a stress test.  You will be given a drug intended to have a similar effect on the heart to that of exercise.  The test will take approximately 3 to 4  hours to complete.    Follow-Up: At [facility], you and your health needs are our priority.  As part of our continuing mission to provide you with exceptional heart care, we have created designated Provider Care Teams.  These Care Teams include your primary Cardiologist (physician) and Advanced Practice Providers (APPs -  Physician Assistants and Nurse Practitioners) who all work together to provide you with the care you need, when you need it.  We recommend signing up for the patient portal called "MyChart".  Sign up information is provided on this After Visit Summary.  MyChart is used to connect with patients for Virtual Visits (Telemedicine).  Patients are able to view lab/test results, encounter notes, upcoming appointments, etc.  Non-urgent messages can be sent to your provider as well.   To learn more about what you can do with MyChart, go to [email or web address].    Your next appointment:   4 week(s)  The format for your next appointment:   In Person  Provider:   Glenetta Hew, MD  -- Pls Schedule for 4 PM & 4:20 PM for her Husband Tanaja Ganger.  Other Instructions MERRY CHRISTMAS!!    Instruction for Myoview  Your doctor has scheduled you for a Myocardial Perfusion scan [facility] Myoview) to obtain  information about the blood flow to your heart. The test consists of taking pictures of your heart in two phases: while resting and after a stress test.  You will be given a drug intended to have a similar effect on the heart to that of exercise.  The test will take approximately 3 to 4  hours to complete.   How to prepare for your test: Do not eat or drink 2 hours prior to your test Do not consume products containing caffeine 12 hours prior to your test (examples: coffee (regular OR decaf), chocolate, sodas, tea) Your doctor may need you to hold certain medications prior to the test.  If so, these are listed below and should not be taken for 24 hours prior to the test.  If not listed below, you may take your medications as normal.  You may resume taking held medications on your normal schedule once the test is complete.   Meds to hold: none Do bring a list of your current medications with you.  If you have held any meds in preparation for the test, please bring them, as you may be required to take them once the test is completed. Do wear comfortable clothes and walking shoes.  Do not wear dresses or overalls. Do NOT wear cologne, perfume, aftershave, or fragranced lotions the day of your test (deodorants okay). If these instructions are not followed your test will have to be rescheduled.   A nuclear cardiologist will review your test, prepare a report  and send it to your physician.   If you have questions or concerns about your appointment, you can call the Nuclear Cardiology department at [phone]. If you cannot keep your appointment, please provide 48 hours notification to avoid a possible $50.00 charge to your account.   Please arrive 15 minutes prior to your appointment time for registration and insurance purposes

## 2021-05-26 NOTE — Assessment & Plan Note (Signed)
Evidence of minimal CAD years ago by cardiac catheterization.  Has been doing relatively well with no significant symptoms for quite a long time.  She has been maintained on combination ACE inhibitor, Calcitrel blocker and beta-blocker along with statin.  She now is complaining of the intermittent episodes of chest pain that is associated with stress (both physical and emotional).  She is the primary caregiver for her husband, [name] who is 22 years older than her and is starting to have failing health.  Her daughter is worried that she herself is now not taking care of herself enough to be take care of her husband.  As such, after discussion with Danial and her daughter, we felt it was best to proceed with ischemic evaluation with [facility].

## 2021-05-26 NOTE — Telephone Encounter (Signed)
First attempt to contact patient and husband for virtual visit  left message on home phone.

## 2021-05-29 ENCOUNTER — Telehealth (HOSPITAL_COMMUNITY): Payer: Self-pay | Admitting: *Deleted

## 2021-05-29 NOTE — Telephone Encounter (Signed)
Patient given detailed instructions per Myocardial Perfusion Study Information Sheet for the test on 05/30/21 Patient notified to arrive 15 minutes early and that it is imperative to arrive on time for appointment to keep from having the test rescheduled.  If you need to cancel or reschedule your appointment, please call the office within 24 hours of your appointment. . Patient verbalized understanding. Kirstie Peri

## 2021-06-01 ENCOUNTER — Other Ambulatory Visit: Payer: Self-pay

## 2021-06-01 ENCOUNTER — Ambulatory Visit (HOSPITAL_COMMUNITY): Payer: Medicare Other | Attending: Cardiology

## 2021-06-01 DIAGNOSIS — R079 Chest pain, unspecified: Secondary | ICD-10-CM | POA: Insufficient documentation

## 2021-06-01 DIAGNOSIS — I119 Hypertensive heart disease without heart failure: Secondary | ICD-10-CM | POA: Insufficient documentation

## 2021-06-01 DIAGNOSIS — I2510 Atherosclerotic heart disease of native coronary artery without angina pectoris: Secondary | ICD-10-CM | POA: Diagnosis not present

## 2021-06-01 HISTORY — PX: NM MYOVIEW LTD: HXRAD82

## 2021-06-01 LAB — MYOCARDIAL PERFUSION IMAGING
Base ST Depression (mm): 0 mm
LV dias vol: 118 mL (ref 46–106)
LV sys vol: 67 mL
Nuc Stress EF: 43 %
Peak HR: 93 {beats}/min
Rest HR: 60 {beats}/min
Rest Nuclear Isotope Dose: 10.2 mCi
SDS: 0
SRS: 0
SSS: 1
ST Depression (mm): 0 mm
Stress Nuclear Isotope Dose: 32.4 mCi
TID: 1

## 2021-06-01 MED ORDER — TECHNETIUM TC 99M TETROFOSMIN IV KIT
10.2000 | PACK | Freq: Once | INTRAVENOUS | Status: AC | PRN
  Administered 2021-06-01: 10.2 via INTRAVENOUS
  Filled 2021-06-01: qty 11

## 2021-06-01 MED ORDER — TECHNETIUM TC 99M TETROFOSMIN IV KIT
32.4000 | PACK | Freq: Once | INTRAVENOUS | Status: AC | PRN
  Administered 2021-06-01: 32.4 via INTRAVENOUS
  Filled 2021-06-01: qty 33

## 2021-06-01 MED ORDER — REGADENOSON 0.4 MG/5ML IV SOLN
0.4000 mg | Freq: Once | INTRAVENOUS | Status: AC
  Administered 2021-06-01: 0.4 mg via INTRAVENOUS

## 2021-06-07 ENCOUNTER — Telehealth: Payer: Self-pay

## 2021-06-07 ENCOUNTER — Other Ambulatory Visit: Payer: Self-pay

## 2021-06-07 ENCOUNTER — Telehealth: Payer: Self-pay | Admitting: Cardiology

## 2021-06-07 DIAGNOSIS — R079 Chest pain, unspecified: Secondary | ICD-10-CM

## 2021-06-07 NOTE — Telephone Encounter (Signed)
Patient returned call. She is aware of her stress test results and Dr. Allison Quarry recommendation to have an echocardiogram. She is agreeable to this plan. Order was placed, she is aware someone will contact her to set up the echo.

## 2021-06-07 NOTE — Telephone Encounter (Signed)
Patient returning call for echo results. 

## 2021-06-07 NOTE — Telephone Encounter (Signed)
Spoke to pt, see telephone encounter

## 2021-06-27 ENCOUNTER — Other Ambulatory Visit (HOSPITAL_COMMUNITY): Payer: Medicare Other

## 2021-06-28 ENCOUNTER — Other Ambulatory Visit: Payer: Self-pay

## 2021-06-28 ENCOUNTER — Ambulatory Visit (HOSPITAL_COMMUNITY)
Admission: RE | Admit: 2021-06-28 | Discharge: 2021-06-28 | Disposition: A | Discharge status: Home / Self Care | Payer: Medicare Other | Source: Ambulatory Visit | Attending: Cardiology | Admitting: Cardiology

## 2021-06-28 DIAGNOSIS — I10 Essential (primary) hypertension: Secondary | ICD-10-CM | POA: Insufficient documentation

## 2021-06-28 DIAGNOSIS — R079 Chest pain, unspecified: Secondary | ICD-10-CM | POA: Insufficient documentation

## 2021-06-28 DIAGNOSIS — E785 Hyperlipidemia, unspecified: Secondary | ICD-10-CM | POA: Diagnosis not present

## 2021-06-28 DIAGNOSIS — I340 Nonrheumatic mitral (valve) insufficiency: Secondary | ICD-10-CM | POA: Diagnosis not present

## 2021-06-28 HISTORY — PX: TRANSTHORACIC ECHOCARDIOGRAM: SHX275

## 2021-06-28 LAB — ECHOCARDIOGRAM COMPLETE
Area-P 1/2: 3.21 cm2
Calc EF: 41.4 %
S' Lateral: 3.93 cm
Single Plane A2C EF: 44.6 %
Single Plane A4C EF: 40.7 %

## 2021-06-29 ENCOUNTER — Ambulatory Visit: Payer: Medicare Other | Admitting: Cardiology

## 2021-06-29 ENCOUNTER — Telehealth: Payer: Self-pay

## 2021-06-29 NOTE — Progress Notes (Deleted)
Primary Care Provider: [name], MD Cardiologist: Glenetta Hew, MD Electrophysiologist: None  Clinic Note: No chief complaint on file.   ===================================  ASSESSMENT/PLAN   Problem List Items Addressed This Visit       Cardiology Problems   Hypertensive cardiovascular disease- mild LVH, grade 1 diastolic dysfunction - Primary (Chronic)   Coronary artery disease, non-occlusive (Chronic)   Hyperlipidemia (Chronic)    ===================================  HPI:    Angelica [name] is a 88 y.o. female with a PMH notable for Hypertension/Hypertensive Heart Disease (now with reduced EF), and Nonobstructive CAD by Cath who presents today for close follow-up to discuss stress test and echocardiogram results.Angelica [name] is being seen today along with her husband of over 42 years Angelica [name]).  They are both former patient of Dr. Rollene Fare.  Her major cardiac history was that in the past she had suffered a cardiac arrest during partial thyroidectomy surgery.  Had not had any further issues since then besides mild palpitations.  She had a cath in 2006 showing minimal CAD along with a nonischemic Myoview and normal Echocardiogram in 2015.  Angelica [name] was last seen via telemedicine 05/26/2021 with complaints of chest pressure and tightness but also worsening dyspnea.  She noted that her husband Angelica [name] having quite a bit of decline in function.  Sleeping most of the the day, but then would be awake during the night.  She has has not been getting enough sleep and staying up with him on a long and trying to do her routine activities during the day..  She started noticing episodes of chest discomfort that can occur with both rest and exertion.  Also noted off-and-on palpitations. => Myoview ordered.  This showed reduced EF but no ischemia or infarction.  As a result, echocardiogram was ordered to confirm reduced EF.  Recent Hospitalizations: None  Reviewed  CV studies:     The following studies were reviewed today: (if available, images/films reviewed: From Epic Chart or Care Everywhere) Myoview 06/01/2021: EF estimated 40 to 45%.  No evidence of ischemia or infarction. TTE 06/28/2021: EF 40 to 45% with mild decreased function.  Global HK.  No regional WMA.  GR 1 DD.  Normal RVP.  Mild LA dilation.  Moderate MR with no MS.  Mild aortic sclerosis but no stenosis.  RAP estimated 8 mmHg suggesting mildly elevated filling pressures.  Interval History:   Angelica [name]   CV Review of Symptoms (Summary) Cardiovascular ROS: {roscv:310661}  REVIEWED OF SYSTEMS   ROS Fatigue.  Not getting enough sleep.  Chest pain, palpitations, back pain or joint pain.  I have reviewed and (if needed) personally updated the patient's problem list, medications, allergies, past medical and surgical history, social and family history.   PAST MEDICAL HISTORY   Past Medical History:  Diagnosis Date   Anemia    Arthritis    Breast cancer ([place]) 2011   left; no adjuvent therapy, Dr. Sonny Dandy   Chronic bronchitis    Colon polyps    Previous colonoscopy by Dr. Irving Shows   Coronary artery disease, non-occlusive 2006   Roughly 40% RCA.; Negative Myoview in June 2013   Diverticulosis of colon    Gout    Hemorrhoids    HTN (hypertension)    Hyperlipidemia    Hypothyroid    MI (myocardial infarction) ([place]) 1973   By report.    PAST SURGICAL HISTORY   Past Surgical History:  Procedure Laterality Date   ABDOMINAL HYSTERECTOMY  TA[facility]   CARDIAC CATHETERIZATION  2006   Roughly 40% RCA lesion, otherwise normal.   CATARACT EXTRACTION W/PHACO  07/02/2011   Procedure: CATARACT EXTRACTION PHACO AND INTRAOCULAR LENS PLACEMENT ([place]);  Surgeon: Williams Che, MD;  Location: [facility];  Service: Ophthalmology;  Laterality: Left;  CDE:3.45   CATARACT EXTRACTION W/PHACO  04/17/2012   Procedure: CATARACT EXTRACTION PHACO AND INTRAOCULAR LENS PLACEMENT ([facility]);  Surgeon: Tonny Branch, MD;   Location: [facility];  Service: Ophthalmology;  Laterality: Right;  CDE:13.40   CHOLECYSTECTOMY     COLONOSCOPY      ?3 years ago. Per pt, diverticulosis, hemorrhoids   COLONOSCOPY  04/06/2011   SLF: 1. Sessile polyp at the ileocecal valve 2. Diverticulosis, moderate 3. Internal hemorrhoids, Large-causing rectal bleeding associated with straining. tubular adenoma.   HEMORRHOID SURGERY     LAPAROSCOPIC LYSIS OF ADHESIONS  2008   Dr. Zella Richer with repair of incisional hernia noted -    MASTECTOMY  2011   left   NM MYOVIEW LTD  June 2013   No ischemia or infarction. Normal EF   TRANSTHORACIC ECHOCARDIOGRAM  June 2013; 12/2013   a) Normal EF, no significant valve disease.; b) EF 50-55%, Gr1 DD, mild LVH.     Immunization History  Administered Date(s) Administered   Pneumococcal Polysaccharide-23 08/21/2018    MEDICATIONS/ALLERGIES   No outpatient medications have been marked as taking for the 06/29/21 encounter (Appointment) with Angelica Man, MD.    Allergies  Allergen Reactions   Bee Venom Anaphylaxis   Ciprofloxacin Nausea And Vomiting   Clindamycin Hcl     Does not know   Flagyl [Metronidazole Hcl] Nausea And Vomiting   Shellfish-Derived Products     Rash developed so patient has steered clear of any since last year   Sulfa Antibiotics Nausea Only    SOCIAL HISTORY/FAMILY HISTORY   Reviewed in Epic:  Pertinent findings:  Social History   Tobacco Use   Smoking status: Never   Smokeless tobacco: Never  Substance Use Topics   Alcohol use: No   Drug use: No   Social History   Social History Narrative   She is a very pleasant married African American woman with 2 children and 3 grandchildren. She does with her husband who is retired Social research officer, government. They now live on a large farm in the [place] area with a roughly 1/4 mile driveway. She usually walks a mile about twice a day, one by herself and 1 with her husband. She also enjoys doing gardening.    OBJCTIVE -PE, EKG,  labs   Wt Readings from Last 3 Encounters:  06/01/21 100 lb (45.4 kg)  05/26/21 100 lb (45.4 kg)  01/17/21 118 lb 12.8 oz (53.9 kg)    Physical Exam: There were no vitals taken for this visit. Physical Exam   Adult ECG Report  Rate: *** ;  Rhythm: {rhythm:17366};   Narrative Interpretation: ***  Recent Labs:  ***  No results found for: CHOL, HDL, LDLCALC, LDLDIRECT, TRIG, CHOLHDL Lab Results  Component Value Date   CREATININE 0.92 12/29/2020   BUN 19 12/29/2020   NA 136 12/29/2020   K 4.2 12/29/2020   CL 96 (L) 12/29/2020   CO2 27 12/29/2020   CBC Latest Ref Rng & Units 12/29/2020 12/23/2019 08/23/2018  WBC 4.0 - 10.5 K/uL 6.7 7.6 10.1  Hemoglobin 12.0 - 15.0 g/dL 11.8(L) 11.4(L) 12.1  Hematocrit 36.0 - 46.0 % 37.5 36.9 38.2  Platelets 150 - 400 K/uL 161 163 151  Lab Results  Component Value Date   HGBA1C 5.5 08/20/2018   Lab Results  Component Value Date   TSH 0.82 04/03/2018    ==================================================  COVID-19 Education: The signs and symptoms of COVID-19 were discussed with the patient and how to seek care for testing (follow up with PCP or arrange E-visit).    I spent a total of ***minutes with the patient spent in direct patient consultation.  Additional time spent with chart review  / charting (studies, outside notes, etc): *** min Total Time: *** min  Current medicines are reviewed at length with the patient today.  (+/- concerns) ***  This visit occurred during the SARS-CoV-2 public health emergency.  Safety protocols were in place, including screening questions prior to the visit, additional usage of staff PPE, and extensive cleaning of exam room while observing appropriate contact time as indicated for disinfecting solutions.  Notice: This dictation was prepared with Dragon dictation along with smart phrase technology. Any transcriptional errors that result from this process are unintentional and may not be corrected upon  review.  Studies Ordered:  No orders of the defined types were placed in this encounter.   Patient Instructions / Medication Changes & Studies & Tests Ordered   There are no Patient Instructions on file for this visit.      Glenetta Hew, M.D., M.S. Interventional Cardiologist   Pager # [phone] Phone # [phone] [address] [place], [place] [address]   Thank you for choosing [facility] in [place]!!

## 2021-06-29 NOTE — Telephone Encounter (Signed)
Patient states that husband was asleep and she could not wake him to get to appointment on 1/19. She tried to call earlier to cancel but was unable to get to the right person to get appointment changed. She stated he got scooter for Christmas and willing to go wherever to see Dr. Ellyn Hack. Got appointments for both on 2/3 with Dr. Ellyn Hack at [facility] office.

## 2021-07-08 ENCOUNTER — Other Ambulatory Visit: Payer: Self-pay | Admitting: Cardiology

## 2021-07-14 ENCOUNTER — Encounter: Payer: Self-pay | Admitting: Cardiology

## 2021-07-14 ENCOUNTER — Other Ambulatory Visit: Payer: Self-pay

## 2021-07-14 ENCOUNTER — Ambulatory Visit (INDEPENDENT_AMBULATORY_CARE_PROVIDER_SITE_OTHER): Payer: Medicare Other | Admitting: Cardiology

## 2021-07-14 VITALS — BP 160/78 | HR 57 | Ht 59.0 in | Wt 119.4 lb

## 2021-07-14 DIAGNOSIS — I2510 Atherosclerotic heart disease of native coronary artery without angina pectoris: Secondary | ICD-10-CM | POA: Diagnosis not present

## 2021-07-14 DIAGNOSIS — I420 Dilated cardiomyopathy: Secondary | ICD-10-CM | POA: Diagnosis not present

## 2021-07-14 DIAGNOSIS — R600 Localized edema: Secondary | ICD-10-CM | POA: Diagnosis not present

## 2021-07-14 DIAGNOSIS — I110 Hypertensive heart disease with heart failure: Secondary | ICD-10-CM

## 2021-07-14 DIAGNOSIS — I119 Hypertensive heart disease without heart failure: Secondary | ICD-10-CM | POA: Diagnosis not present

## 2021-07-14 DIAGNOSIS — I5089 Other heart failure: Secondary | ICD-10-CM | POA: Diagnosis not present

## 2021-07-14 DIAGNOSIS — R079 Chest pain, unspecified: Secondary | ICD-10-CM

## 2021-07-14 MED ORDER — FUROSEMIDE 20 MG PO TABS: ORAL_TABLET | ORAL | 2 refills | Status: DC

## 2021-07-14 MED ORDER — AMLODIPINE BESYLATE 10 MG PO TABS: 10.0000 mg | ORAL_TABLET | Freq: Every day | ORAL | 2 refills | Status: DC

## 2021-07-14 NOTE — Progress Notes (Signed)
Primary Care Provider: [name], MD Cardiologist: Glenetta Hew, MD Electrophysiologist: None  Clinic Note: Chief Complaint  Patient presents with   Follow-up    Roughly 1 month-test results   ===================================  ASSESSMENT/PLAN   Problem List Items Addressed This Visit       Cardiology Problems   Hypertensive cardiovascular disease- mild LVH, grade 1 diastolic dysfunction (Chronic)    She now has mild to moderate reduced LV systolic function in addition to grade 1 diastolic dysfunction with poorly controlled hypertension.  Trivial CHF symptoms with edema exertional dyspnea.  I do not be overly aggressive treating blood pressure because of her dizziness.  Plan: Increase amlodipine dose to 10 mg daily Continue current dose of enalapril and Toprol Increase furosemide dose to 40 mg daily for 4 days and then reduce down to 1 or 2 tablets a day.  She has been only taking it PRN. Recheck BMP in 3 weeks      Relevant Medications   furosemide (LASIX) 20 MG tablet   amLODipine (NORVASC) 10 MG tablet   Other Relevant Orders   EKG 12-Lead (Completed)   Basic metabolic panel   EKG 17-BLTJ (Completed)   Basic metabolic panel   Coronary artery disease, non-occlusive (Chronic)    Nonischemic Myoview in 2013 and previous Reported 40% RCA disease.  Current Myoview was nonischemic but EF reduced.  No Myoview perfusion defect, or echocardiogram regional wall motion normality.  This would argue against obstructive CAD. -> We talked about coronary CT angiogram versus cardiac catheterization for full clarification.  She did not be overly interested in doing additional procedures.  Provided she is a stable, will hold for the testing.  Continue risk factor modification with blood pressure control and lipid management 81 mg aspirin.      Relevant Medications   furosemide (LASIX) 20 MG tablet   amLODipine (NORVASC) 10 MG tablet   Other Relevant Orders   EKG  12-Lead (Completed)   Basic metabolic panel   Dilated cardiomyopathy (HCC) - Primary    Mild to moderate reduced EF of 40 to 45% on both Myoview and echo.  The absence of fixed perfusion defect or wall motion normality would argue against this being ischemic CAD related myopathy.  Potentially more related to burned-out hypertensive heart disease.  Continue current meds but increase amlodipine to 10 mg daily and furosemide to 40 mg daily 4 days a week and 20 mg 3 days a week to avoid volume overload.      Relevant Medications   furosemide (LASIX) 20 MG tablet   amLODipine (NORVASC) 10 MG tablet     Other   Bilateral lower extremity edema (Chronic)    Continue support stockings and gradually titrate up Lasix.  With high blood pressures and some edema now we will increase to 40 mg daily 4 days a week and 20 mg 3 days a week.      Chest pain with moderate risk for cardiac etiology    Still documents that the chest pain is cardiac, although it could be.  Myoview stress test was nonischemic.  Symptoms were at rest more so than with exertion.  Could be microvascular disease associated with hypertensive heart disease.  Titrate up blood pressure control by increasing amlodipine to 10 mg daily and increase diuretic to reduce volume..       ===================================  HPI:    Angelica [name] is a 88 y.o. female with a PMH notable for HTN/Hypertensive Heart Disease, Nonocclusive CAD  by Cath who presents today for 1 month in person follow-up To Discuss Results of Myoview and Echocardiogram.  Angelica [name] was last seen on May 26, 2021 via telemedicine (phone call) with complaints of intermittent chest pain.  She has been under quite bit of stress because her husband [name] has had some turns for the worse in his health.  Her husband not sleeping well at night she sometimes does not get good sleep because she is startled by him.  She can wake up and having some chest pain off and on.   She is otherwise also had some episodes of chest pain during the day with both rest and exertion.  She is extremely concerned because he has had some falls.  With all the stress she also notes some palpitations off and on. => Really tired-not getting enough sleep, chest pain and palpitations as well as back and joint pain. I ordered a Lexiscan Myoview to assess for ischemia which was read as nonischemic, but has significantly reduced EF.  We ordered an echo to corroborate.  Recent Hospitalizations: None  Reviewed  CV studies:    The following studies were reviewed today: (if available, images/films reviewed: From Epic Chart or Care Everywhere) Myoview 06/01/21: Intermediate Risk Study due to reduced EF-estimated 40 to 45%.  No evidence of ischemia or infarction..  Echo 06/28/2021: EF estimated 40 to 45% with mild reduced overall function.  Global HK, no RWMA. GR 1 DD.  Mild LA dilation.  Moderate MR normal RV size and function.  With mildly elevated RAP, but normal RVP.  Interval History:   Angelica [name] returns here today for inpatient visit.  I have not actually seen her in person since October 2021 as a patient herself.  I have seen her several times accompanying her husband who is also being seen today. She actually seems to be doing pretty well.  She says that she still has occasional chest tightness that is oftentimes in the evening-usually associated with when she is somewhat stressed out at the end of the day.  It often happens when she is preparing dinner, not necessarily when walking around or doing chores during the course of the day..  She still does have some shortness of breath with exertion and chronic mild lower extremity edema.  She is not having any PND orthopnea, chest discomfort spells are relatively fleeting.  She gets dizzy somewhat easily, but has not had any falls, syncope or near syncope.  CV Review of Symptoms (Summary) Cardiovascular ROS: positive for - chest pain,  dyspnea on exertion, edema, and fatigue, low energy-poor sleep.  Dizziness and lightheadedness. negative for - irregular heartbeat, orthopnea, palpitations, paroxysmal nocturnal dyspnea, rapid heart rate, shortness of breath, or syncope/near syncope or TIA/amaurosis fugax, claudication  REVIEWED OF SYSTEMS   Review of Systems  Constitutional:  Positive for malaise/fatigue (Not getting good sleep; tired all the time). Negative for chills, fever and weight loss (She seems to put on weight in the last year or so.).  HENT:  Negative for congestion and nosebleeds.   Respiratory:  Positive for shortness of breath (With exertion). Negative for cough.   Cardiovascular:  Positive for leg swelling (Per HPI).  Gastrointestinal:  Negative for blood in stool and melena.  Genitourinary:  Negative for dysuria and hematuria.  Musculoskeletal:  Positive for back pain and joint pain.  Neurological:  Positive for dizziness (Often positional, and if she turns certain ways.  Also she looks up). Negative for focal weakness  and weakness.  Psychiatric/Behavioral:  Negative for depression and memory loss. The patient is not nervous/anxious and does not have insomnia.    I have reviewed and (if needed) personally updated the patient's problem list, medications, allergies, past medical and surgical history, social and family history.   PAST MEDICAL HISTORY   Past Medical History:  Diagnosis Date   Anemia    Arthritis    Breast cancer ([place]) 2011   left; no adjuvent therapy, Dr. Sonny Dandy   Chronic bronchitis    Colon polyps    Previous colonoscopy by Dr. Irving Shows   Coronary artery disease, non-occlusive 2006   Roughly 40% RCA.; Negative Myoview in June 2013   Diverticulosis of colon    Gout    Hemorrhoids    HTN (hypertension)    Hyperlipidemia    Hypothyroid    MI (myocardial infarction) ([place]) 1973   By report.    PAST SURGICAL HISTORY   Past Surgical History:  Procedure Laterality Date   ABDOMINAL  HYSTERECTOMY     TAH   CARDIAC CATHETERIZATION  2006   Roughly 40% RCA lesion, otherwise normal.   CATARACT EXTRACTION W/PHACO  07/02/2011   Procedure: CATARACT EXTRACTION PHACO AND INTRAOCULAR LENS PLACEMENT (IOC);  Surgeon: Williams Che, MD;  Location: [facility];  Service: Ophthalmology;  Laterality: Left;  CDE:3.45   CATARACT EXTRACTION W/PHACO  04/17/2012   Procedure: CATARACT EXTRACTION PHACO AND INTRAOCULAR LENS PLACEMENT (IOC);  Surgeon: Tonny Branch, MD;  Location: [facility];  Service: Ophthalmology;  Laterality: Right;  CDE:13.40   CHOLECYSTECTOMY     COLONOSCOPY      ?3 years ago. Per pt, diverticulosis, hemorrhoids   COLONOSCOPY  04/06/2011   SLF: 1. Sessile polyp at the ileocecal valve 2. Diverticulosis, moderate 3. Internal hemorrhoids, Large-causing rectal bleeding associated with straining. tubular adenoma.   HEMORRHOID SURGERY     LAPAROSCOPIC LYSIS OF ADHESIONS  2008   Dr. Zella Richer with repair of incisional hernia noted -    MASTECTOMY  2011   left   NM MYOVIEW LTD  June 2013   No ischemia or infarction. Normal EF   TRANSTHORACIC ECHOCARDIOGRAM  June 2013; 12/2013   a) Normal EF, no significant valve disease.; b) EF 50-55%, Gr1 DD, mild LVH.     Immunization History  Administered Date(s) Administered   Pneumococcal Polysaccharide-23 08/21/2018    MEDICATIONS/ALLERGIES   Current Meds  Medication Sig   allopurinol (ZYLOPRIM) 300 MG tablet Take 300 mg by mouth daily.     amLODipine (NORVASC) 10 MG tablet Take 1 tablet (10 mg total) by mouth daily.   aspirin EC 81 MG tablet Take 81 mg by mouth daily.   atorvastatin (LIPITOR) 20 MG tablet TAKE (1) TABLET BY MOUTH AT BEDTIME FOR CHOLESTEROL.   enalapril (VASOTEC) 10 MG tablet Take 10 mg by mouth daily.   ergocalciferol (VITAMIN D2) 1.25 MG (50000 UT) capsule Take 1 capsule (50,000 Units total) by mouth once a week.   levothyroxine (SYNTHROID, LEVOTHROID) 50 MCG tablet Take 1 tablet (50 mcg total) by mouth daily.    metoprolol succinate (TOPROL-XL) 50 MG 24 hr tablet Take 50 mg by mouth daily. Take with or immediately following a meal.   Multiple Vitamins-Minerals (MULTIVITAMINS THER. W/MINERALS) TABS Take 1 tablet by mouth daily.     nitroGLYCERIN (NITROSTAT) 0.4 MG SL tablet DISSOLVE 1 TABLET UNDER TONGUE EVERY 5 MINUTES AS NEEDED FOR CHEST PAIN.   pantoprazole (PROTONIX) 40 MG tablet TAKE ONE TABLET BY MOUTH DAILY.  potassium chloride SA (KLOR-CON M) 20 MEQ tablet TAKE ONE TABLET BY MOUTH ONCE DAILY.   Propylene Glycol 0.6 % SOLN Place 1 drop into both eyes 2 (two) times daily.   [DISCONTINUED] amLODipine (NORVASC) 5 MG tablet TAKE (1) TABLET BY MOUTH ONCE DAILY.   [DISCONTINUED] furosemide (LASIX) 20 MG tablet Take 20 mg by mouth daily.     Allergies  Allergen Reactions   Bee Venom Anaphylaxis   Ciprofloxacin Nausea And Vomiting   Clindamycin Hcl     Does not know   Flagyl [Metronidazole Hcl] Nausea And Vomiting   Shellfish-Derived Products     Rash developed so patient has steered clear of any since last year   Sulfa Antibiotics Nausea Only    SOCIAL HISTORY/FAMILY HISTORY   Reviewed in Epic:  Pertinent findings:  Social History   Tobacco Use   Smoking status: Never   Smokeless tobacco: Never  Substance Use Topics   Alcohol use: No   Drug use: No   Social History   Social History Narrative   She is a very pleasant married African American woman with 2 children and 3 grandchildren. She does with her husband who is retired Social research officer, government. [place] now live [place] in the [place] area with a roughly 1/4 mile driveway. She usually walks a mile about twice a day, one by herself and 1 with her husband. She also enjoys doing gardening.    OBJCTIVE -PE, EKG, labs   Wt Readings from Last 3 Encounters:  07/14/21 119 lb 6.4 oz (54.2 kg)  06/01/21 100 lb (45.4 kg)  05/26/21 100 lb (45.4 kg)    Physical Exam: BP (!) 160/78    Pulse (!) 57    Ht 4\' 11"  (1.499 m)    Wt 119 lb 6.4 oz  (54.2 kg)    SpO2 99%    BMI 24.12 kg/m  Physical Exam Vitals reviewed.  Constitutional:      General: She is not in acute distress.    Appearance: Normal appearance. She is normal weight. She is not ill-appearing or toxic-appearing.     Comments: Well-nourished and well-groomed.  Healthy-appearing.  HENT:     Head: Normocephalic and atraumatic.  Neck:     Vascular: No carotid bruit or JVD.  Cardiovascular:     Rate and Rhythm: Normal rate and regular rhythm. No extrasystoles are present.    Chest Wall: PMI is not displaced.     Pulses: Normal pulses.     Heart sounds: S1 normal and S2 normal. Heart sounds are distant. No murmur heard.   No friction rub. No gallop.  Pulmonary:     Effort: Pulmonary effort is normal. No respiratory distress.     Breath sounds: Normal breath sounds. No wheezing, rhonchi or rales.  Chest:     Chest wall: Tenderness (Tender costochondral border) present.  Musculoskeletal:        General: Swelling (Trivial ankle) present.     Cervical back: Normal range of motion and neck supple.  Skin:    General: Skin is dry.     Coloration: Skin is not jaundiced.  Neurological:     General: No focal deficit present.     Mental Status: She is alert and oriented to person, place, and time.     Gait: Gait abnormal (Slow but not antalgic).  Psychiatric:        Mood and Affect: Mood normal.        Behavior: Behavior normal.  Thought Content: Thought content normal.        Judgment: Judgment normal.    Adult ECG Report  Rate: 57 ;  Rhythm: sinus bradycardia, sinus arrhythmia, and nonspecific ST-T wave changes. ;   Narrative Interpretation: Stable  Recent Labs:   10/07/2020: TC 188, TG 80, HDL and LDL both 87. No results found for: CHOL, HDL, LDLCALC, LDLDIRECT, TRIG, CHOLHDL Lab Results  Component Value Date   CREATININE 0.92 12/29/2020   BUN 19 12/29/2020   NA 136 12/29/2020   K 4.2 12/29/2020   CL 96 (L) 12/29/2020   CO2 27 12/29/2020   CBC  Latest Ref Rng & Units 12/29/2020 12/23/2019 08/23/2018  WBC 4.0 - 10.5 K/uL 6.7 7.6 10.1  Hemoglobin 12.0 - 15.0 g/dL 11.8(L) 11.4(L) 12.1  Hematocrit 36.0 - 46.0 % 37.5 36.9 38.2  Platelets 150 - 400 K/uL 161 163 151    Lab Results  Component Value Date   HGBA1C 5.5 08/20/2018   Lab Results  Component Value Date   TSH 0.82 04/03/2018    ==================================================  COVID-19 Education: The signs and symptoms of COVID-19 were discussed with the patient and how to seek care for testing (follow up with PCP or arrange E-visit).    I spent a total of 26 minutes with the patient spent in direct patient consultation.  Additional time spent with chart review  / charting (studies, outside notes, etc): 19 min Total Time: 45 min  Current medicines are reviewed at length with the patient today.  (+/- concerns) none  This visit occurred during the SARS-CoV-2 public health emergency.  Safety protocols were in place, including screening questions prior to the visit, additional usage of staff PPE, and extensive cleaning of exam room while observing appropriate contact time as indicated for disinfecting solutions.  Notice: This dictation was prepared with Dragon dictation along with smart phrase technology. Any transcriptional errors that result from this process are unintentional and may not be corrected upon review.  Studies Ordered:   Orders Placed This Encounter  Procedures   Basic metabolic panel   EKG 91-MBWG    Patient Instructions / Medication Changes & Studies & Tests Ordered   Patient Instructions  Medication Instructions:   Lasix 40 mg ( 2 tablets)  Monday,  Tuesday  ,Thursday , Fridays Lasix 20 mg  ( 1 tablet )  Wednesday, Saturday, Sunday    Increase Amlodipine 10 mg  one tablet daily    *If you need a refill on your cardiac medications before your next appointment, please call your pharmacy*   Lab [place], 2023 Bmp - 3 weeks  If you  have labs (blood work) drawn today and your tests are completely normal, you will receive your results only by: [place] (if you have MyChart) OR A paper copy in the mail If you have any lab test that is abnormal or we need to change your treatment, we will call you to review the results.   Testing/Procedures:  Not needed  Follow-Up: At [facility], you and your health needs are our priority.  As part of our continuing mission to provide you with exceptional heart care, we have created designated Provider Care Teams.  These Care Teams include your primary Cardiologist (physician) and Advanced Practice Providers (APPs -  Physician Assistants and Nurse Practitioners) who all work together to provide you with the care you need, when you need it.     Your next appointment:   4 month(s)  June 2023  The format for your next appointment:   In Person  Provider:   Glenetta Hew, MD     Glenetta Hew, M.D., M.S. Interventional Cardiologist   Pager # [phone] Phone # [phone] [address] [place], [place] [address]   Thank you for choosing Heartcare at [facility]!!

## 2021-07-14 NOTE — Patient Instructions (Addendum)
Medication Instructions:   Lasix 40 mg ( 2 tablets)  Monday,  Tuesday  ,Thursday , Fridays Lasix 20 mg  ( 1 tablet )  Wednesday, Saturday, Sunday    Increase Amlodipine 10 mg  one tablet daily    *If you need a refill on your cardiac medications before your next appointment, please call your pharmacy*   Lab [place], 2023 Bmp - 3 weeks  If you have labs (blood work) drawn today and your tests are completely normal, you will receive your results only by: [place] (if you have MyChart) OR A paper copy in the mail If you have any lab test that is abnormal or we need to change your treatment, we will call you to review the results.   Testing/Procedures:  Not needed  Follow-Up: At [facility], you and your health needs are our priority.  As part of our continuing mission to provide you with exceptional heart care, we have created designated Provider Care Teams.  These Care Teams include your primary Cardiologist (physician) and Advanced Practice Providers (APPs -  Physician Assistants and Nurse Practitioners) who all work together to provide you with the care you need, when you need it.     Your next appointment:   4 month(s)  June 2023  The format for your next appointment:   In Person  Provider:   Glenetta Hew, MD

## 2021-07-19 ENCOUNTER — Encounter: Payer: Self-pay | Admitting: Cardiology

## 2021-07-19 DIAGNOSIS — I420 Dilated cardiomyopathy: Secondary | ICD-10-CM | POA: Insufficient documentation

## 2021-07-19 NOTE — Assessment & Plan Note (Signed)
Mild to moderate reduced EF of 40 to 45% on both Myoview and echo.  The absence of fixed perfusion defect or wall motion normality would argue against this being ischemic CAD related myopathy.  Potentially more related to burned-out hypertensive heart disease.  Continue current meds but increase amlodipine to 10 mg daily and furosemide to 40 mg daily 4 days a week and 20 mg 3 days a week to avoid volume overload.

## 2021-07-19 NOTE — Assessment & Plan Note (Addendum)
Nonischemic Myoview in 2013 and previous Reported 40% RCA disease.  Current Myoview was nonischemic but EF reduced.  No Myoview perfusion defect, or echocardiogram regional wall motion normality.  This would argue against obstructive CAD. -> We talked about coronary CT angiogram versus cardiac catheterization for full clarification.  She did not be overly interested in doing additional procedures.  Provided she is a stable, will hold for the testing.  Continue risk factor modification with blood pressure control and lipid management 81 mg aspirin.

## 2021-07-19 NOTE — Assessment & Plan Note (Addendum)
She now has mild to moderate reduced LV systolic function in addition to grade 1 diastolic dysfunction with poorly controlled hypertension.  Trivial CHF symptoms with edema exertional dyspnea.  I do not be overly aggressive treating blood pressure because of her dizziness.  Plan:  Increase amlodipine dose to 10 mg daily  Continue current dose of enalapril and Toprol  Increase furosemide dose to 40 mg daily for 4 days and then reduce down to 1 or 2 tablets a day.  She has been only taking it PRN.  Recheck BMP in 3 weeks

## 2021-07-19 NOTE — Assessment & Plan Note (Signed)
Continue support stockings and gradually titrate up Lasix.  With high blood pressures and some edema now we will increase to 40 mg daily 4 days a week and 20 mg 3 days a week.

## 2021-07-19 NOTE — Assessment & Plan Note (Signed)
Still documents that the chest pain is cardiac, although it could be.  Myoview stress test was nonischemic.  Symptoms were at rest more so than with exertion.  Could be microvascular disease associated with hypertensive heart disease.  Titrate up blood pressure control by increasing amlodipine to 10 mg daily and increase diuretic to reduce volume.Marland Kitchen

## 2021-08-07 DIAGNOSIS — Z20822 Contact with and (suspected) exposure to covid-19: Secondary | ICD-10-CM | POA: Diagnosis not present

## 2021-08-10 ENCOUNTER — Other Ambulatory Visit: Payer: Self-pay | Admitting: Cardiology

## 2021-08-17 DIAGNOSIS — Z20828 Contact with and (suspected) exposure to other viral communicable diseases: Secondary | ICD-10-CM | POA: Diagnosis not present

## 2021-09-04 DIAGNOSIS — Z20822 Contact with and (suspected) exposure to covid-19: Secondary | ICD-10-CM | POA: Diagnosis not present

## 2021-09-07 DIAGNOSIS — Z1321 Encounter for screening for nutritional disorder: Secondary | ICD-10-CM | POA: Diagnosis not present

## 2021-09-07 DIAGNOSIS — E063 Autoimmune thyroiditis: Secondary | ICD-10-CM | POA: Diagnosis not present

## 2021-09-07 DIAGNOSIS — E785 Hyperlipidemia, unspecified: Secondary | ICD-10-CM | POA: Diagnosis not present

## 2021-09-07 DIAGNOSIS — E042 Nontoxic multinodular goiter: Secondary | ICD-10-CM | POA: Diagnosis not present

## 2021-09-07 DIAGNOSIS — N951 Menopausal and female climacteric states: Secondary | ICD-10-CM | POA: Diagnosis not present

## 2021-09-07 DIAGNOSIS — I10 Essential (primary) hypertension: Secondary | ICD-10-CM | POA: Diagnosis not present

## 2021-09-07 DIAGNOSIS — E876 Hypokalemia: Secondary | ICD-10-CM | POA: Diagnosis not present

## 2021-09-07 DIAGNOSIS — E039 Hypothyroidism, unspecified: Secondary | ICD-10-CM | POA: Diagnosis not present

## 2021-09-12 DIAGNOSIS — Z20822 Contact with and (suspected) exposure to covid-19: Secondary | ICD-10-CM | POA: Diagnosis not present

## 2021-09-14 DIAGNOSIS — Z20822 Contact with and (suspected) exposure to covid-19: Secondary | ICD-10-CM | POA: Diagnosis not present

## 2021-09-21 DIAGNOSIS — E876 Hypokalemia: Secondary | ICD-10-CM | POA: Diagnosis not present

## 2021-09-21 DIAGNOSIS — E042 Nontoxic multinodular goiter: Secondary | ICD-10-CM | POA: Diagnosis not present

## 2021-09-21 DIAGNOSIS — K219 Gastro-esophageal reflux disease without esophagitis: Secondary | ICD-10-CM | POA: Diagnosis not present

## 2021-09-21 DIAGNOSIS — E6609 Other obesity due to excess calories: Secondary | ICD-10-CM | POA: Diagnosis not present

## 2021-09-21 DIAGNOSIS — E785 Hyperlipidemia, unspecified: Secondary | ICD-10-CM | POA: Diagnosis not present

## 2021-09-21 DIAGNOSIS — I10 Essential (primary) hypertension: Secondary | ICD-10-CM | POA: Diagnosis not present

## 2021-09-21 DIAGNOSIS — E063 Autoimmune thyroiditis: Secondary | ICD-10-CM | POA: Diagnosis not present

## 2021-09-21 DIAGNOSIS — E039 Hypothyroidism, unspecified: Secondary | ICD-10-CM | POA: Diagnosis not present

## 2021-09-25 DIAGNOSIS — Z20822 Contact with and (suspected) exposure to covid-19: Secondary | ICD-10-CM | POA: Diagnosis not present

## 2021-10-03 DIAGNOSIS — Z20822 Contact with and (suspected) exposure to covid-19: Secondary | ICD-10-CM | POA: Diagnosis not present

## 2021-10-06 ENCOUNTER — Other Ambulatory Visit: Payer: Self-pay | Admitting: Cardiology

## 2021-10-16 DIAGNOSIS — Z20822 Contact with and (suspected) exposure to covid-19: Secondary | ICD-10-CM | POA: Diagnosis not present

## 2021-11-09 ENCOUNTER — Other Ambulatory Visit: Payer: Self-pay | Admitting: Cardiology

## 2021-12-08 ENCOUNTER — Ambulatory Visit: Payer: Medicare Other | Admitting: Cardiology

## 2021-12-27 ENCOUNTER — Other Ambulatory Visit: Payer: Self-pay | Admitting: Cardiology

## 2022-01-01 ENCOUNTER — Inpatient Hospital Stay (HOSPITAL_COMMUNITY): Payer: Medicare Other | Attending: Hematology

## 2022-01-01 ENCOUNTER — Ambulatory Visit (HOSPITAL_COMMUNITY)
Admission: RE | Admit: 2022-01-01 | Discharge: 2022-01-01 | Disposition: A | Discharge status: Home / Self Care | Payer: Medicare Other | Source: Ambulatory Visit | Attending: Physician Assistant | Admitting: Physician Assistant

## 2022-01-01 DIAGNOSIS — Z803 Family history of malignant neoplasm of breast: Secondary | ICD-10-CM | POA: Insufficient documentation

## 2022-01-01 DIAGNOSIS — Z853 Personal history of malignant neoplasm of breast: Secondary | ICD-10-CM | POA: Insufficient documentation

## 2022-01-01 DIAGNOSIS — Z9012 Acquired absence of left breast and nipple: Secondary | ICD-10-CM | POA: Diagnosis not present

## 2022-01-01 DIAGNOSIS — D649 Anemia, unspecified: Secondary | ICD-10-CM | POA: Insufficient documentation

## 2022-01-01 DIAGNOSIS — C50912 Malignant neoplasm of unspecified site of left female breast: Secondary | ICD-10-CM

## 2022-01-01 DIAGNOSIS — E559 Vitamin D deficiency, unspecified: Secondary | ICD-10-CM | POA: Insufficient documentation

## 2022-01-01 DIAGNOSIS — Z8042 Family history of malignant neoplasm of prostate: Secondary | ICD-10-CM | POA: Insufficient documentation

## 2022-01-01 DIAGNOSIS — Z1231 Encounter for screening mammogram for malignant neoplasm of breast: Secondary | ICD-10-CM | POA: Diagnosis not present

## 2022-01-01 DIAGNOSIS — I10 Essential (primary) hypertension: Secondary | ICD-10-CM | POA: Insufficient documentation

## 2022-01-01 DIAGNOSIS — Z90710 Acquired absence of both cervix and uterus: Secondary | ICD-10-CM | POA: Insufficient documentation

## 2022-01-01 DIAGNOSIS — M8580 Other specified disorders of bone density and structure, unspecified site: Secondary | ICD-10-CM | POA: Insufficient documentation

## 2022-01-05 ENCOUNTER — Inpatient Hospital Stay (HOSPITAL_COMMUNITY): Payer: Medicare Other

## 2022-01-05 DIAGNOSIS — C50912 Malignant neoplasm of unspecified site of left female breast: Secondary | ICD-10-CM

## 2022-01-05 DIAGNOSIS — M8580 Other specified disorders of bone density and structure, unspecified site: Secondary | ICD-10-CM | POA: Diagnosis not present

## 2022-01-05 DIAGNOSIS — D649 Anemia, unspecified: Secondary | ICD-10-CM | POA: Diagnosis not present

## 2022-01-05 DIAGNOSIS — Z853 Personal history of malignant neoplasm of breast: Secondary | ICD-10-CM | POA: Diagnosis not present

## 2022-01-05 DIAGNOSIS — Z8042 Family history of malignant neoplasm of prostate: Secondary | ICD-10-CM | POA: Diagnosis not present

## 2022-01-05 DIAGNOSIS — E559 Vitamin D deficiency, unspecified: Secondary | ICD-10-CM

## 2022-01-05 DIAGNOSIS — Z803 Family history of malignant neoplasm of breast: Secondary | ICD-10-CM | POA: Diagnosis not present

## 2022-01-05 DIAGNOSIS — I10 Essential (primary) hypertension: Secondary | ICD-10-CM | POA: Diagnosis not present

## 2022-01-05 DIAGNOSIS — Z9012 Acquired absence of left breast and nipple: Secondary | ICD-10-CM | POA: Diagnosis not present

## 2022-01-05 DIAGNOSIS — Z90710 Acquired absence of both cervix and uterus: Secondary | ICD-10-CM | POA: Diagnosis not present

## 2022-01-05 LAB — CBC WITH DIFFERENTIAL/PLATELET
Abs Immature Granulocytes: 0.02 10*3/uL (ref 0.00–0.07)
Basophils Absolute: 0.1 10*3/uL (ref 0.0–0.1)
Basophils Relative: 1 %
Eosinophils Absolute: 0.2 10*3/uL (ref 0.0–0.5)
Eosinophils Relative: 4 %
HCT: 36.2 % (ref 36.0–46.0)
Hemoglobin: 11.5 g/dL — ABNORMAL LOW (ref 12.0–15.0)
Immature Granulocytes: 0 %
Lymphocytes Relative: 28 %
Lymphs Abs: 1.9 10*3/uL (ref 0.7–4.0)
MCH: 31.2 pg (ref 26.0–34.0)
MCHC: 31.8 g/dL (ref 30.0–36.0)
MCV: 98.1 fL (ref 80.0–100.0)
Monocytes Absolute: 0.5 10*3/uL (ref 0.1–1.0)
Monocytes Relative: 7 %
Neutro Abs: 3.9 10*3/uL (ref 1.7–7.7)
Neutrophils Relative %: 60 %
Platelets: 184 10*3/uL (ref 150–400)
RBC: 3.69 MIL/uL — ABNORMAL LOW (ref 3.87–5.11)
RDW: 13.9 % (ref 11.5–15.5)
WBC: 6.6 10*3/uL (ref 4.0–10.5)
nRBC: 0 % (ref 0.0–0.2)

## 2022-01-05 LAB — COMPREHENSIVE METABOLIC PANEL
ALT: 11 U/L (ref 0–44)
AST: 19 U/L (ref 15–41)
Albumin: 4.1 g/dL (ref 3.5–5.0)
Alkaline Phosphatase: 55 U/L (ref 38–126)
Anion gap: 9 (ref 5–15)
BUN: 24 mg/dL — ABNORMAL HIGH (ref 8–23)
CO2: 29 mmol/L (ref 22–32)
Calcium: 9.3 mg/dL (ref 8.9–10.3)
Chloride: 100 mmol/L (ref 98–111)
Creatinine, Ser: 0.84 mg/dL (ref 0.44–1.00)
GFR, Estimated: >60 mL/min (ref >60)
Glucose, Bld: 105 mg/dL — ABNORMAL HIGH (ref 70–99)
Potassium: 4.4 mmol/L (ref 3.5–5.1)
Sodium: 138 mmol/L (ref 135–145)
Total Bilirubin: 1.5 mg/dL — ABNORMAL HIGH (ref 0.3–1.2)
Total Protein: 7.6 g/dL (ref 6.5–8.1)

## 2022-01-05 LAB — VITAMIN D 25 HYDROXY (VIT D DEFICIENCY, FRACTURES): Vit D, 25-Hydroxy: 17.62 ng/mL — ABNORMAL LOW (ref 30–100)

## 2022-01-07 NOTE — Progress Notes (Unsigned)
[address] [place], [place] [address]   CLINIC:  Medical Oncology/Hematology  PCP:  [name], [place] / [place] [address] [phone]   REASON FOR VISIT:  Follow-up for history of multicentric stage I left breast cancer, triple negative   PRIOR THERAPY: Left total mastectomy with sentinel lymph node biopsies (03/21/2010)   NGS Results: None available   CURRENT THERAPY: Surveillance  BRIEF ONCOLOGIC HISTORY:  Oncology History  Breast cancer ([facility])  03/14/2011 Initial Diagnosis   Breast cancer ([facility])   11/14/2016 Mammogram   BIRADS 1     CANCER STAGING: Cancer Staging  No matching staging information was found for the patient.  INTERVAL HISTORY:  [name], a 88 y.o. female, returns for routine follow-up of her history of left-sided breast cancer. [name] was last seen on 01/17/2021 by Tarri Abernethy PA-C.   At today's visit, she  reports feeling fairly well.  She denies any recent hospitalizations, surgeries, or changes in her  baseline health status.  Most recent mammogram (unilateral, right breast) on 01/01/2022 showed no evidence of malignancy.  She continues to have some mild chronic left upper extremity lymphedema, uses compression sleeve at home.    She denies any symptoms of recurrence such as new lumps, bone pain, chest pain, dyspnea, or abdominal pain.  She has no new headaches, seizures, or focal neurologic deficits.    At her annual visit in July 2022, patient had reported significant unintentional weight loss.  CT scans were obtained, which showed no evidence of malignancy.  Her weight today is 126 pounds, which is improved over the past year.  She reports good appetite.  She denies any B symptoms such as fever, chills, or night sweats.    She had also previously reported nontender lymphadenopathy of the right side of her neck, although CT scan did not show any signs of abnormal lymphadenopathy.     She reports 100% energy and 100% appetite.  She is maintaining stable weight at this time.    REVIEW OF SYSTEMS:  Review of Systems  Constitutional:  Positive for fatigue. Negative for appetite change, chills, diaphoresis, fever and unexpected weight change.  HENT:   Negative for lump/mass and nosebleeds.   Eyes:  Negative for eye problems.  Respiratory:  Negative for cough, hemoptysis and shortness of breath.   Cardiovascular:  Positive for palpitations. Negative for chest pain and leg swelling.  Gastrointestinal:  Positive for abdominal pain (occasional, associated with bowel movements) and diarrhea. Negative for blood in stool, constipation, nausea and vomiting.  Genitourinary:  Negative for hematuria.   Musculoskeletal:  Positive for arthralgias (left hip).  Skin: Negative.   Neurological:  Positive for dizziness. Negative for headaches and light-headedness.  Hematological:  Does not bruise/bleed easily.    PAST MEDICAL/SURGICAL HISTORY:  Past Medical History:  Diagnosis Date   Anemia    Arthritis    Breast cancer ([facility]) 2011   left; no adjuvent therapy, Dr. Sonny Dandy   Chronic bronchitis    Colon polyps    Previous colonoscopy by Dr. Irving Shows   Coronary artery disease, non-occlusive 2006   Roughly 40% RCA.; Negative Myoview in June 2013   Diverticulosis of colon    Gout    Hemorrhoids    HTN (hypertension)    Hyperlipidemia    Hypothyroid    MI (myocardial infarction) ([facility]) 1973   By report.   Past Surgical History:  Procedure Laterality Date   ABDOMINAL HYSTERECTOMY  TAH   CARDIAC CATHETERIZATION  2006   Roughly 40% RCA lesion, otherwise normal.   CATARACT EXTRACTION W/PHACO  07/02/2011   Procedure: CATARACT EXTRACTION PHACO AND INTRAOCULAR LENS PLACEMENT ([facility]);  Surgeon: Williams Che, MD;  Location: [facility];  Service: Ophthalmology;  Laterality: Left;  CDE:3.45   CATARACT EXTRACTION W/PHACO  04/17/2012   Procedure: CATARACT EXTRACTION PHACO AND INTRAOCULAR  LENS PLACEMENT ([facility]);  Surgeon: Tonny Branch, MD;  Location: [facility];  Service: Ophthalmology;  Laterality: Right;  CDE:13.40   CHOLECYSTECTOMY     COLONOSCOPY      ?3 years ago. Per pt, diverticulosis, hemorrhoids   COLONOSCOPY  04/06/2011   SLF: 1. Sessile polyp at the ileocecal valve 2. Diverticulosis, moderate 3. Internal hemorrhoids, Large-causing rectal bleeding associated with straining. tubular adenoma.   HEMORRHOID SURGERY     LAPAROSCOPIC LYSIS OF ADHESIONS  2008   Dr. Zella Richer with repair of incisional hernia noted -    MASTECTOMY  2011   left   NM MYOVIEW LTD  June 2013   No ischemia or infarction. Normal EF   TRANSTHORACIC ECHOCARDIOGRAM  June 2013; 12/2013   a) Normal EF, no significant valve disease.; b) EF 50-55%, Gr1 DD, mild LVH.     SOCIAL HISTORY:  Social History   Socioeconomic History   Marital status: Married    Spouse name: Not on file   Number of children: 2   Years of education: Not on file   Highest education level: Not on file  Occupational History   Occupation: retired    Fish farm manager: RETIRED  Tobacco Use   Smoking status: Never   Smokeless tobacco: Never  Substance and Sexual Activity   Alcohol use: No   Drug use: No   Sexual activity: Not Currently  Other Topics Concern   Not on file  Social History Narrative   She is a very pleasant married African American woman with 2 children and 3 grandchildren. She does with her husband who is retired Social research officer, government. They now live on a large farm in the [place] area with a roughly 1/4 mile driveway. She usually walks a mile about twice a day, one by herself and 1 with her husband. She also enjoys doing gardening.   Social Determinants of Health   Financial Resource Strain: Not on file  Food Insecurity: Not on file  Transportation Needs: Not on file  Physical Activity: Not on file  Stress: Not on file  Social Connections: Not on file  Intimate Partner Violence: Not on file    FAMILY HISTORY:  Family  History  Problem Relation Age of Onset   Prostate cancer Father    Breast cancer Sister    Breast cancer Other        paternal niece   Colon cancer Neg Hx    Anesthesia problems Neg Hx    Hypotension Neg Hx    Malignant hyperthermia Neg Hx    Pseudochol deficiency Neg Hx    Liver disease Neg Hx     CURRENT MEDICATIONS:  Current Outpatient Medications  Medication Sig Dispense Refill   potassium chloride SA (KLOR-CON M) 20 MEQ tablet Take 1 tablet (20 mEq total) by mouth daily. 60 tablet 0   allopurinol (ZYLOPRIM) 300 MG tablet Take 300 mg by mouth daily.       amLODipine (NORVASC) 10 MG tablet Take 1 tablet (10 mg total) by mouth daily. 90 tablet 2   aspirin EC 81 MG tablet Take 81 mg by mouth daily.  atorvastatin (LIPITOR) 20 MG tablet TAKE (1) TABLET BY MOUTH AT BEDTIME FOR CHOLESTEROL. 90 tablet 3   enalapril (VASOTEC) 10 MG tablet Take 10 mg by mouth daily.     ergocalciferol (VITAMIN D2) 1.25 MG (50000 UT) capsule Take 1 capsule (50,000 Units total) by mouth once a week. 4 capsule 11   furosemide (LASIX) 20 MG tablet Take 40 mg ( 2 tablets )  on Monday, Tuesday, Friday, and then on take 20 mg ( 1 tablet) on Wednesday, Saturday, Sunday. 180 tablet 2   levothyroxine (SYNTHROID, LEVOTHROID) 50 MCG tablet Take 1 tablet (50 mcg total) by mouth daily. 90 tablet 3   metoprolol succinate (TOPROL-XL) 50 MG 24 hr tablet Take 50 mg by mouth daily. Take with or immediately following a meal.     Multiple Vitamins-Minerals (MULTIVITAMINS THER. W/MINERALS) TABS Take 1 tablet by mouth daily.       nitroGLYCERIN (NITROSTAT) 0.4 MG SL tablet DISSOLVE 1 TABLET UNDER TONGUE EVERY 5 MINUTES AS NEEDED FOR CHEST PAIN. 25 tablet 4   pantoprazole (PROTONIX) 40 MG tablet TAKE ONE TABLET BY MOUTH DAILY. 90 tablet 3   Propylene Glycol 0.6 % SOLN Place 1 drop into both eyes 2 (two) times daily.     No current facility-administered medications for this visit.    ALLERGIES:  Allergies  Allergen  Reactions   Bee Venom Anaphylaxis   Ciprofloxacin Nausea And Vomiting   Clindamycin Hcl     Does not know   Flagyl [Metronidazole Hcl] Nausea And Vomiting   Shellfish-Derived Products     Rash developed so patient has steered clear of any since last year   Sulfa Antibiotics Nausea Only    PHYSICAL EXAM:    Performance status (ECOG): 1 - Symptomatic but completely ambulatory  There were no vitals filed for this visit. Wt Readings from Last 3 Encounters:  07/14/21 119 lb 6.4 oz (54.2 kg)  06/01/21 100 lb (45.4 kg)  05/26/21 100 lb (45.4 kg)   Physical Exam Constitutional:      Appearance: Normal appearance.  HENT:     Head: Normocephalic and atraumatic.     Mouth/Throat:     Mouth: Mucous membranes are moist.  Eyes:     Extraocular Movements: Extraocular movements intact.     Pupils: Pupils are equal, round, and reactive to light.  Cardiovascular:     Rate and Rhythm: Normal rate and regular rhythm.     Pulses: Normal pulses.     Heart sounds: Normal heart sounds.  Pulmonary:     Effort: Pulmonary effort is normal.     Breath sounds: Normal breath sounds.  Chest:       Comments: No discrete nodules or masses palpated in right breast Abdominal:     General: Bowel sounds are normal.     Palpations: Abdomen is soft.     Tenderness: There is no abdominal tenderness.  Musculoskeletal:        General: No swelling.     Right lower leg: No edema.     Left lower leg: No edema.     Comments: Lymphedema of left upper extremity  Lymphadenopathy:     Head:     Right side of head: No submental, submandibular, tonsillar, preauricular, posterior auricular or occipital adenopathy.     Left side of head: No submental, submandibular, tonsillar, preauricular, posterior auricular or occipital adenopathy.     Cervical: Cervical adenopathy (questionable prominence of right anterior cervical lymph nodes) present.  Right cervical: No superficial, deep or posterior cervical  adenopathy.    Left cervical: No superficial, deep or posterior cervical adenopathy.     Upper Body:     Right upper body: No supraclavicular or axillary adenopathy.     Left upper body: No supraclavicular or axillary adenopathy.  Skin:    General: Skin is warm and dry.  Neurological:     General: No focal deficit present.     Mental Status: She is alert and oriented to person, place, and time.  Psychiatric:        Mood and Affect: Mood normal.        Behavior: Behavior normal.      LABORATORY DATA:  I have reviewed the labs as listed.     Latest Ref Rng & Units 01/05/2022    9:22 AM 12/29/2020   10:46 AM 12/23/2019    9:47 AM  CBC  WBC 4.0 - 10.5 K/uL 6.6  6.7  7.6   Hemoglobin 12.0 - 15.0 g/dL 11.5  11.8  11.4   Hematocrit 36.0 - 46.0 % 36.2  37.5  36.9   Platelets 150 - 400 K/uL 184  161  163       Latest Ref Rng & Units 01/05/2022    9:22 AM 12/29/2020   10:46 AM 12/23/2019    9:47 AM  CMP  Glucose 70 - 99 mg/dL 105  105  101   BUN 8 - 23 mg/dL '24  19  22   '$ Creatinine 0.44 - 1.00 mg/dL 0.84  0.92  0.90   Sodium 135 - 145 mmol/L 138  136  138   Potassium 3.5 - 5.1 mmol/L 4.4  4.2  4.3   Chloride 98 - 111 mmol/L 100  96  100   CO2 22 - 32 mmol/L '29  27  29   '$ Calcium 8.9 - 10.3 mg/dL 9.3  9.5  9.4   Total Protein 6.5 - 8.1 g/dL 7.6  7.8  7.5   Total Bilirubin 0.3 - 1.2 mg/dL 1.5  1.5  1.3   Alkaline Phos 38 - 126 U/L 55  49  55   AST 15 - 41 U/L '19  16  16   '$ ALT 0 - 44 U/L '11  10  12     '$ DIAGNOSTIC IMAGING:  I have independently reviewed the scans and discussed with the patient. MM 3D SCREEN BREAST UNI RIGHT  Result Date: 01/02/2022 CLINICAL DATA:  Screening. EXAM: DIGITAL SCREENING UNILATERAL RIGHT MAMMOGRAM WITH CAD AND TOMOSYNTHESIS TECHNIQUE: Right screening digital craniocaudal and mediolateral oblique mammograms were obtained. Right screening digital breast tomosynthesis was performed. The images were evaluated with computer-aided detection. COMPARISON:   Previous exam(s). ACR Breast Density Category b: There are scattered areas of fibroglandular density. FINDINGS: There are no findings suspicious for malignancy. IMPRESSION: No mammographic evidence of malignancy. A result letter of this screening mammogram will be mailed directly to the patient. RECOMMENDATION: Screening mammogram in one year. (Code:SM-B-01Y) BI-RADS CATEGORY  1: Negative. Electronically Signed   By: Lajean Manes M.D.   On: 01/02/2022 12:47     ASSESSMENT & PLAN: 1.  Multicentric stage I left breast cancer, triple negative - S/P left total mastectomy with sentinel node sampling on 03/21/2010 at which time 0.4 cm and 0.2 cm tumors were found, both triple negative. She received no postoperative therapy. - Most recent mammogram, unilateral right breast (01/01/2022): BI-RADS Category 1, negative.  No evidence of malignancy in the right breast. - Breast  examination today was within normal limits, no discrete nodules or masses palpated on exam of right breast and left mastectomy site - ROS negative for any "red flag" symptoms of recurrence - Most recent labs (01/05/2022): Unremarkable apart from mild anemia with Hgb 11.5/MCV 98.1.  CMP relatively unremarkable, mild elevation in bilirubin stable x2 years.  No other elevations in LFTs.  Kidney function normal - PLAN: RTC in 1 year with repeat mammogram, labs, and office visit.    2.  Osteopenia: - DEXA scan on 03/26/2018 showed T score of -1.8. - PLAN: Osteopenia is unrelated to the patient's breast cancer, as she was never on any antiestrogen therapy.  Will defer to PCP for ongoing monitoring and management of bone density.  3.  Vitamin D deficiency: - Most recent labs (01/05/2022) show vitamin D deficiency at 17.62 - She is prescribed vitamin D 50,000 units weekly  - PLAN: Refill prescription sent for ergocalciferol 50,000 units weekly.  4.  Unintentional weight loss +/- right cervical lymphadenopathy: - At her visit in July 2022,  patient was noted to have lost about 26 pounds in the past year - Patient also reported nontender lymphadenopathy of the right side of her neck, which has been present for several months per her report - CT chest abdomen and pelvis (01/12/2021): No CT findings of chest, abdomen, or pelvis to explain weight loss; no specific evidence of malignant recurrence, lymphadenopathy, or metastatic disease; small bilateral pulmonary nodules measuring 3 mm or smaller -CT soft tissue neck: Bilateral subcentimeter cervical lymph nodes, measuring up to 7 mm; no abnormal lymph nodes by density or CT size criteria - PLAN: No concern for malignant weight loss or malignant lymphadenopathy at this time.   All questions were answered. The patient knows to call the clinic with any problems, questions or concerns.  Medical decision making: Moderate  Time spent on visit: I spent 20 minutes counseling the patient face to face. The total time spent in the appointment was 30 minutes and more than 50% was on counseling.   Harriett Rush, PA-C  01/08/2022 9:47 AM

## 2022-01-08 ENCOUNTER — Inpatient Hospital Stay (HOSPITAL_BASED_OUTPATIENT_CLINIC_OR_DEPARTMENT_OTHER): Payer: Medicare Other | Admitting: Physician Assistant

## 2022-01-08 VITALS — BP 148/79 | HR 75 | Temp 97.9°F | Resp 18 | Ht 59.0 in | Wt 127.0 lb

## 2022-01-08 DIAGNOSIS — I10 Essential (primary) hypertension: Secondary | ICD-10-CM | POA: Diagnosis not present

## 2022-01-08 DIAGNOSIS — Z1231 Encounter for screening mammogram for malignant neoplasm of breast: Secondary | ICD-10-CM | POA: Diagnosis not present

## 2022-01-08 DIAGNOSIS — Z9012 Acquired absence of left breast and nipple: Secondary | ICD-10-CM | POA: Diagnosis not present

## 2022-01-08 DIAGNOSIS — Z90710 Acquired absence of both cervix and uterus: Secondary | ICD-10-CM | POA: Diagnosis not present

## 2022-01-08 DIAGNOSIS — C50912 Malignant neoplasm of unspecified site of left female breast: Secondary | ICD-10-CM | POA: Diagnosis not present

## 2022-01-08 DIAGNOSIS — E559 Vitamin D deficiency, unspecified: Secondary | ICD-10-CM | POA: Diagnosis not present

## 2022-01-08 DIAGNOSIS — D649 Anemia, unspecified: Secondary | ICD-10-CM

## 2022-01-08 DIAGNOSIS — M8580 Other specified disorders of bone density and structure, unspecified site: Secondary | ICD-10-CM | POA: Diagnosis not present

## 2022-01-08 MED ORDER — ERGOCALCIFEROL 1.25 MG (50000 UT) PO CAPS: 50000.0000 [IU] | ORAL_CAPSULE | ORAL | 11 refills | Status: AC

## 2022-01-08 NOTE — Patient Instructions (Signed)
[place] at [facility] Discharge Instructions  You were seen today by Tarri Abernethy PA-C for your history of breast cancer.    As we discussed, your most recent mammogram did not show any signs of returning cancer in your breast tissue.   Your vitamin D level was low, please continue taking prescription strength Vitamin D2 (ergocalciferol) 50,000 units once per week.  Prescription has been sent to your preferred pharmacy.  OTHER TESTS: Mammogram in July 2024  MEDICATIONS: Vitamin D 50,000 units once a week  FOLLOW-UP APPOINTMENT: Annual office visit in 1 year  _____________________________________________________________   Thank you for choosing [place] at [facility] to provide your oncology and hematology care.  To afford each patient quality time with our provider, please arrive at least 15 minutes before your scheduled appointment time.   If you have a lab appointment with the [facility] please come in thru the Main Entrance and check in at the main information desk.  You need to re-schedule your appointment should you arrive 10 or more minutes late.  We strive to give you quality time with our providers, and arriving late affects you and other patients whose appointments are after yours.  Also, if you no show three or more times for appointments you may be dismissed from the clinic at the providers discretion.     Again, thank you for choosing [facility].  Our hope is that these requests will decrease the amount of time that you wait before being seen by our physicians.       _____________________________________________________________  Should you have questions after your visit to [facility], please contact our office at [phone] and follow the prompts.  Our office hours are 8:00 a.m. and 4:30 p.m. Monday - Friday.  Please note that voicemails left after 4:00 p.m. may not be returned until  the following business day.  We are closed weekends and major holidays.  You do have access to a nurse 24-7, just call the main number to the clinic [phone] and do not press any options, hold on the line and a nurse will answer the phone.    For prescription refill requests, have your pharmacy contact our office and allow 72 hours.    Due to Covid, you will need to wear a mask upon entering the hospital. If you do not have a mask, a mask will be given to you at the Main Entrance upon arrival. For doctor visits, patients may have 1 support person age 83 or older with them. For treatment visits, patients can not have anyone with them due to social distancing guidelines and our immunocompromised population.

## 2022-02-14 ENCOUNTER — Telehealth: Payer: Self-pay | Admitting: *Deleted

## 2022-02-14 NOTE — Patient Outreach (Signed)
  Care Coordination   02/14/2022 Name: Angelica [name] MRN: [id number] DOB: 12/31/1932   Care Coordination Outreach Attempts:  An unsuccessful telephone outreach was attempted today to offer the patient information about available care coordination services as a benefit of their health plan.   Follow Up Plan:  Additional outreach attempts will be made to offer the patient care coordination information and services.   Encounter Outcome:  No Answer  Care Coordination Interventions Activated:  No   Care Coordination Interventions:  No, not indicated    Chong Sicilian, BSN, RN-BC [place] / [facility] Pharmacist, community Dial: [phone]

## 2022-02-20 ENCOUNTER — Telehealth: Payer: Self-pay | Admitting: *Deleted

## 2022-02-20 NOTE — Patient Outreach (Signed)
  Care Coordination   02/20/2022 Name: Angelica [name] MRN: [id number] DOB: 1933-04-11   Care Coordination Outreach Attempts:  A second unsuccessful outreach was attempted today to offer the patient with information about available care coordination services as a benefit of their health plan.     Follow Up Plan:  Additional outreach attempts will be made to offer the patient care coordination information and services.   Encounter Outcome:  No Answer  Care Coordination Interventions Activated:  No   Care Coordination Interventions:  No, not indicated    Chong Sicilian, BSN, RN-BC RN Care Coordinator [place]: [phone] Main #: [phone]

## 2022-02-23 ENCOUNTER — Ambulatory Visit: Payer: Medicare Other | Attending: Cardiology | Admitting: Cardiology

## 2022-02-23 ENCOUNTER — Encounter: Payer: Self-pay | Admitting: Cardiology

## 2022-02-23 VITALS — BP 130/76 | HR 58 | Ht 59.0 in | Wt 135.0 lb

## 2022-02-23 DIAGNOSIS — R600 Localized edema: Secondary | ICD-10-CM | POA: Diagnosis not present

## 2022-02-23 DIAGNOSIS — I110 Hypertensive heart disease with heart failure: Secondary | ICD-10-CM | POA: Diagnosis not present

## 2022-02-23 DIAGNOSIS — I5042 Chronic combined systolic (congestive) and diastolic (congestive) heart failure: Secondary | ICD-10-CM | POA: Diagnosis not present

## 2022-02-23 DIAGNOSIS — I2510 Atherosclerotic heart disease of native coronary artery without angina pectoris: Secondary | ICD-10-CM | POA: Insufficient documentation

## 2022-02-23 DIAGNOSIS — I420 Dilated cardiomyopathy: Secondary | ICD-10-CM | POA: Insufficient documentation

## 2022-02-23 MED ORDER — FUROSEMIDE 20 MG PO TABS
20.0000 mg | ORAL_TABLET | Freq: Every day | ORAL | 3 refills | Status: DC
Start: 2022-02-23 — End: 2024-02-19

## 2022-02-23 NOTE — Assessment & Plan Note (Signed)
Myoview in December 2022 showed reduced EF but no ischemia or infarction.  I suspect that the myopathy was nonischemic in nature.  We did that for some atypical chest pain but in the absence of symptoms did not investigate further.  She probably has some microvascular disease simply given her advanced age.  She is on aspirin along with amlodipine and beta-blocker for anginal benefit.  Has nitroglycerin ordered but not taken.  With her advanced age we have decided to avoid aggressive lipid management.  Her LDL at baseline is still relatively well controlled

## 2022-02-23 NOTE — Patient Instructions (Signed)
Medication Instructions:  Continue furosemide (Lasix) 20 mg daily --ok to take additional 20 mg as needed for weight increase  *If you need a refill on your cardiac medications before your next appointment, please call your pharmacy*  Follow-Up: At [facility], you and your health needs are our priority.  As part of our continuing mission to provide you with exceptional heart care, we have created designated Provider Care Teams.  These Care Teams include your primary Cardiologist (physician) and Advanced Practice Providers (APPs -  Physician Assistants and Nurse Practitioners) who all work together to provide you with the care you need, when you need it.  We recommend signing up for the patient portal called "MyChart".  Sign up information is provided on this After Visit Summary.  MyChart is used to connect with patients for Virtual Visits (Telemedicine).  Patients are able to view lab/test results, encounter notes, upcoming appointments, etc.  Non-urgent messages can be sent to your provider as well.   To learn more about what you can do with MyChart, go to [email or web address].    Your next appointment:   6 month(s)  The format for your next appointment:   In Person  Provider:   Glenetta Hew, MD       Important Information About Sugar

## 2022-02-23 NOTE — Assessment & Plan Note (Signed)
She does have some mild edema and some exertional dyspnea but no PND orthopnea.  I think the edema is probably more related to venous stasis than true heart failure.  She does have a mildly reduced EF with NYHA class II symptoms.   Trying to avoid overly aggressive treatment of blood pressure.  Plan:  On stable dose of amlodipine 10 mg daily and enalapril 10 mg daily.  Unable to push metoprolol any further than 50 mg daily.-This has palpitations well controlled.  She is having some dizziness wonder if she just does not need to be on as much Lasix.  She is actually only been taking the Lasix daily.  We will switch her prescription to 1 tablet daily but allow for as needed dosing for extra swelling or edema.

## 2022-02-23 NOTE — Progress Notes (Signed)
/;[  Primary Care Provider: [name], [place] Cardiologist: Glenetta Hew, MD Electrophysiologist: None  Clinic Note: Chief Complaint  Patient presents with   Follow-up    6+ month follow-up.  Doing well.    ===================================  ASSESSMENT/PLAN   Problem List Items Addressed This Visit       Cardiology Problems   Coronary artery disease, non-occlusive (Chronic)    Myoview in December 2022 showed reduced EF but no ischemia or infarction.  I suspect that the myopathy was nonischemic in nature.  We did that for some atypical chest pain but in the absence of symptoms did not investigate further.  She probably has some microvascular disease simply given her advanced age.  She is on aspirin along with amlodipine and beta-blocker for anginal benefit.  Has nitroglycerin ordered but not taken.  With her advanced age we have decided to avoid aggressive lipid management.  Her LDL at baseline is still relatively well controlled      Relevant Medications   furosemide (LASIX) 20 MG tablet   Hypertensive cardiovascular disease- mild LVH, grade 1 diastolic dysfunction - Primary (Chronic)    She does have some mild edema and some exertional dyspnea but no PND orthopnea.  I think the edema is probably more related to venous stasis than true heart failure.  She does have a mildly reduced EF with NYHA class II symptoms.   Trying to avoid overly aggressive treatment of blood pressure.  Plan: On stable dose of amlodipine 10 mg daily and enalapril 10 mg daily. Unable to push metoprolol any further than 50 mg daily.-This has palpitations well controlled. She is having some dizziness wonder if she just does not need to be on as much Lasix.  She is actually only been taking the Lasix daily.  We will switch her prescription to 1 tablet daily but allow for as needed dosing for extra swelling or edema.      Relevant Medications   furosemide (LASIX) 20 MG  tablet   Dilated cardiomyopathy (HCC) (Chronic)    Mild to moderate use EF of 40 to 45% on echo.  NYHA class II symptoms.  I suspect this is probably related to hypertensive heart disease. Simply continue to treat blood pressure with amlodipine and enalapril along with reduced dose of Lasix.      Relevant Medications   furosemide (LASIX) 20 MG tablet     Other   Bilateral lower extremity edema (Chronic)    Probably is much related to venous stasis is where the mid to heart failure.    Recommend foot elevation.  We talked about support stockings but she has a difficult time putting on.  On standing dose of Lasix 20 mg daily with PRN extra dosing if necessary for edema.  We discussed the sliding scale regimen..      ===================================  HPI:    Angelica [name] is a 88 y.o. female with a PMH below who presents today for 96-monthfollow-up. She follows up today at the request of FRedmond School MD.  PMH: Hypertensive Heart Disease: EF now down to 40 to 45%, global HK.  Nonocclusive CAD by Cath in the past.   Nonischemic Myoview.  December 2022  Angelica [name] was last seen on 07/14/2021 to discuss results of her echocardiogram Myoview.  She is under a lot of stress because of the declining health of her husband BRush Landmark  She was having intermittent episodes of chest pain.  She was not getting good  sleep because he was not getting good sleep Under a lot of psychological stress.  Chest pains were during rest and with exertion. => She was actually doing well during this visit still occasional chest tightness but again usually with stress.  Some exertional dyspnea and mild lower extremity edema.  No PND orthopnea.  Fleeting chest discomfort only.  Feeling tired-not sleeping.  Recent Hospitalizations: None  Reviewed  CV studies:    The following studies were reviewed today: (if available, images/films reviewed: From Epic Chart or Care Everywhere) Transthoracic Echo 06/28/2021: EF  40 to 45%.  Mildly decreased function.  No RWMA.  GR 1 DD-mild LA dilation..  Normal PASP with mildly elevated RAP.  Normal RV function. Myoview 06/01/2021: Reduced LVEF of 40 to 45%.  No evidence of ischemia or infarction.  Read as intermediate risk because of reduced function.:  Interval History:   Angelica [name] returns for 70-monthfollow-up.  This is actually the first time that I am seeing her since her husband's death.  She is actually doing a whole lot better.  Now that she does not have all the stress that she had she is not really having the chest pain or pressure now is much as she was.  Just rare occasions.  A little bit of swelling in the right leg more than the left, but pretty benign.  She is having some dizzy spells off and on.  With the edema she is not having any PND orthopnea.  A little exertional dyspnea but she not all that active.  She is always on the go, but is not really exercising.  Fatigue is notably improved since she is sleeping better.  Dizziness and lightheadedness also better.  CV Review of Symptoms (Summary): positive for - mild baseline exertional dyspnea, intermittent edema. negative for - chest pain, irregular heartbeat, orthopnea, palpitations, paroxysmal nocturnal dyspnea, rapid heart rate, shortness of breath, or syncope/near syncope, TIA/amaurosis fugax, claudication  REVIEWED OF SYSTEMS   Review of Systems  Constitutional:  Negative for malaise/fatigue (Much less fatigue.  Sleeping better.  Much less social stress.) and weight loss (She actually has put back on some of the weight that she had lost.).  Gastrointestinal:  Negative for blood in stool, heartburn and melena.  Genitourinary:  Negative for dysuria and hematuria.  Musculoskeletal:  Positive for joint pain (Knee pain mostly limits her activity.). Negative for myalgias.  Neurological:  Positive for dizziness (Off-and-on, usually positional). Negative for focal weakness.  Psychiatric/Behavioral:   Positive for memory loss (minimal). Negative for depression (Not really depression.  She still misses BILL but she knows he is in a better place and not suffering.). The patient is not nervous/anxious and does not have insomnia.    I have reviewed and (if needed) personally updated the patient's problem list, medications, allergies, past medical and surgical history, social and family history.   PAST MEDICAL HISTORY   Past Medical History:  Diagnosis Date   Anemia    Arthritis    [place]   left; no adjuvent therapy, Dr. KSonny Dandy  Chronic bronchitis    Colon polyps    Previous colonoscopy by Dr. LIrving Shows  Coronary artery disease, non-occlusive 2006   Roughly 40% RCA.; Negative Myoview in June 2013   Diverticulosis of colon    Gout    Hemorrhoids    HTN (hypertension)    Hyperlipidemia    Hypothyroid    [place]   By report.  PAST SURGICAL HISTORY   Past Surgical History:  Procedure Laterality Date   ABDOMINAL HYSTERECTOMY     TAH   CARDIAC CATHETERIZATION  06/11/2004   Roughly 40% RCA lesion, otherwise normal.   CATARACT EXTRACTION W/PHACO  07/02/2011   Procedure: CATARACT EXTRACTION PHACO AND INTRAOCULAR LENS PLACEMENT (IOC);  Surgeon: Williams Che, MD;  Location: [facility];  Service: Ophthalmology;  Laterality: Left;  CDE:3.45   CATARACT EXTRACTION W/PHACO  04/17/2012   Procedure: CATARACT EXTRACTION PHACO AND INTRAOCULAR LENS PLACEMENT (IOC);  Surgeon: Tonny Branch, MD;  Location: [facility];  Service: Ophthalmology;  Laterality: Right;  CDE:13.40   CHOLECYSTECTOMY     COLONOSCOPY      ?3 years ago. Per pt, diverticulosis, hemorrhoids   COLONOSCOPY  04/06/2011   SLF: 1. Sessile polyp at the ileocecal valve 2. Diverticulosis, moderate 3. Internal hemorrhoids, Large-causing rectal bleeding associated with straining. tubular adenoma.   HEMORRHOID SURGERY     LAPAROSCOPIC LYSIS OF ADHESIONS  06/11/2006   Dr. Zella Richer with  repair of incisional hernia noted -    MASTECTOMY  06/11/2009   left   NM MYOVIEW LTD  06/01/2021   Reduced LVEF of 40 to 45%.  No evidence of ischemia or infarction.  Read as intermediate risk because of reduced function.   TRANSTHORACIC ECHOCARDIOGRAM  12/2013   a) June 2013: Normal EF, no significant valve disease.; b) July 2015: EF 50-55%, Gr1 DD, mild LVH.   TRANSTHORACIC ECHOCARDIOGRAM  06/28/2021   Ordered due to low EF on Myoview:  EF 40 to 45%.  Mildly decreased function.  No RWMA.  GR 1 DD-mild LA dilation..  Normal PASP with mildly elevated RAP.  Normal RV function.    Immunization History  Administered Date(s) Administered   Pneumococcal Polysaccharide-23 08/21/2018    MEDICATIONS/ALLERGIES   Current Meds  Medication Sig   allopurinol (ZYLOPRIM) 300 MG tablet Take 300 mg by mouth daily.     aspirin EC 81 MG tablet Take 81 mg by mouth daily.   atorvastatin (LIPITOR) 20 MG tablet TAKE (1) TABLET BY MOUTH AT BEDTIME FOR CHOLESTEROL.   enalapril (VASOTEC) 10 MG tablet Take 10 mg by mouth daily.   ergocalciferol (VITAMIN D2) 1.25 MG (50000 UT) capsule Take 1 capsule (50,000 Units total) by mouth once a week.   levothyroxine (SYNTHROID, LEVOTHROID) 50 MCG tablet Take 1 tablet (50 mcg total) by mouth daily.   metoprolol succinate (TOPROL-XL) 50 MG 24 hr tablet Take 50 mg by mouth daily. Take with or immediately following a meal.   Multiple Vitamins-Minerals (MULTIVITAMINS THER. W/MINERALS) TABS Take 1 tablet by mouth daily.     nitroGLYCERIN (NITROSTAT) 0.4 MG SL tablet DISSOLVE 1 TABLET UNDER TONGUE EVERY 5 MINUTES AS NEEDED FOR CHEST PAIN.   pantoprazole (PROTONIX) 40 MG tablet TAKE ONE TABLET BY MOUTH DAILY.   potassium chloride SA (KLOR-CON M) 20 MEQ tablet Take 1 tablet (20 mEq total) by mouth daily.   Propylene Glycol 0.6 % SOLN Place 1 drop into both eyes 2 (two) times daily.   '[]'$  furosemide (LASIX) 20 MG tablet  take 20 mg ( 1 tablet) Daily.    Allergies  Allergen  Reactions   Bee Venom Anaphylaxis   Ciprofloxacin Nausea And Vomiting   Clindamycin Hcl     Does not know   Flagyl [Metronidazole Hcl] Nausea And Vomiting   Shellfish-Derived Products     Rash developed so patient has steered clear of any since last year   Sulfa Antibiotics Nausea Only  SOCIAL HISTORY/FAMILY HISTORY   Reviewed in Epic:  Pertinent findings:  Social History   Tobacco Use   Smoking status: Never   Smokeless tobacco: Never  Substance Use Topics   Alcohol use: No   Drug use: No   Social History   Social History Narrative   She is a very pleasant recently widowed African American woman with 2 children and 3 grandchildren.       She is married to her husband for close to 33 years.  He was a retired Land.     She continues to live [place] in the [place] area with a roughly 1/4 mile driveway.    She also enjoys doing gardening.    OBJCTIVE -PE, EKG, labs   Wt Readings from Last 3 Encounters:  02/23/22 135 lb (61.2 kg)  01/08/22 126 lb 15.8 oz (57.6 kg)  07/14/21 119 lb 6.4 oz (54.2 kg)    Physical Exam: BP 130/76   Pulse (!) 58   Ht '4\' 11"'$  (1.499 m)   Wt 135 lb (61.2 kg)   SpO2 98%   BMI 27.27 kg/m  Physical Exam Vitals reviewed.  Constitutional:      General: She is not in acute distress.    Appearance: Normal appearance. She is normal weight. She is not ill-appearing (Healthy-appearing.  Well-nourished well-groomed.  Actually appears younger than stated age.) or toxic-appearing.  HENT:     Head: Normocephalic and atraumatic.  Neck:     Vascular: No carotid bruit or JVD.  Cardiovascular:     Rate and Rhythm: Normal rate and regular rhythm. No extrasystoles are present.    Chest Wall: PMI is not displaced.     Pulses: Normal pulses and intact distal pulses.     Heart sounds: S1 normal and S2 normal. Heart sounds are distant. No murmur heard.    No friction rub. No gallop.  Pulmonary:     Effort: Pulmonary  effort is normal. No respiratory distress.     Breath sounds: Normal breath sounds. No wheezing, rhonchi or rales.  Chest:     Chest wall: No tenderness.  Musculoskeletal:        General: Swelling (Trivial) present.     Cervical back: Normal range of motion and neck supple.  Skin:    General: Skin is warm and dry.  Neurological:     General: No focal deficit present.     Mental Status: She is alert and oriented to person, place, and time.     Gait: Gait abnormal (Antalgic).  Psychiatric:        Mood and Affect: Mood normal.        Behavior: Behavior normal.        Thought Content: Thought content normal.        Judgment: Judgment normal.     Comments: Still sad being here without her husband.  She misses him.     Adult ECG Report Not checked  Recent Labs: Reviewed No results found for: "CHOL", "HDL", "LDLCALC", "LDLDIRECT", "TRIG", "CHOLHDL" Lab Results  Component Value Date   CREATININE 0.84 01/05/2022   BUN 24 (H) 01/05/2022   NA 138 01/05/2022   K 4.4 01/05/2022   CL 100 01/05/2022   CO2 29 01/05/2022      Latest Ref Rng & Units 01/05/2022    9:22 AM 12/29/2020   10:46 AM 12/23/2019    9:47 AM  CBC  WBC 4.0 - 10.5 K/uL 6.6  6.7  7.6   Hemoglobin 12.0 - 15.0 g/dL 11.5  11.8  11.4   Hematocrit 36.0 - 46.0 % 36.2  37.5  36.9   Platelets 150 - 400 K/uL 184  161  163     Lab Results  Component Value Date   HGBA1C 5.5 08/20/2018   Lab Results  Component Value Date   TSH 0.82 04/03/2018    ================================================== I spent a total of 18 minutes with the patient spent in direct patient consultation.  Additional time spent with chart review  / charting (studies, outside notes, etc): 25 min Total Time: 43 min  Current medicines are reviewed at length with the patient today.  (+/- concerns) N/A  Notice: This dictation was prepared with Dragon dictation along with smart phrase technology. Any transcriptional errors that result from this  process are unintentional and may not be corrected upon review.  Studies Ordered:   No orders of the defined types were placed in this encounter.  Meds ordered this encounter  Medications   furosemide (LASIX) 20 MG tablet    Sig: Take 1 tablet (20 mg total) by mouth daily. Ok to take additional 20 mg AS NEEDED    Dispense:  120 tablet    Refill:  3    Patient Instructions / Medication Changes & Studies & Tests Ordered   Patient Instructions  Medication Instructions:  Continue furosemide (Lasix) 20 mg daily --ok to take additional 20 mg as needed for weight increase  *If you need a refill on your cardiac medications before your next appointment, please call your pharmacy*  Follow-Up: At [facility], you and your health needs are our priority.  As part of our continuing mission to provide you with exceptional heart care, we have created designated Provider Care Teams.  These Care Teams include your primary Cardiologist (physician) and Advanced Practice Providers (APPs -  Physician Assistants and Nurse Practitioners) who all work together to provide you with the care you need, when you need it.  We recommend signing up for the patient portal called "MyChart".  Sign up information is provided on this After Visit Summary.  MyChart is used to connect with patients for Virtual Visits (Telemedicine).  Patients are able to view lab/test results, encounter notes, upcoming appointments, etc.  Non-urgent messages can be sent to your provider as well.   To learn more about what you can do with MyChart, go to [email or web address].    Your next appointment:   6 month(s)  The format for your next appointment:   In Person  Provider:   Glenetta Hew, MD       Important Information About Sugar           Leonie Man, MD, MS Glenetta Hew, M.D., M.S. Interventional Cardiologist  [facility]  Pager # [phone] Phone # [phone] [address]  [place], [place] [address]   Thank you for choosing [facility]!!

## 2022-02-23 NOTE — Assessment & Plan Note (Signed)
Probably is much related to venous stasis is where the mid to heart failure.    Recommend foot elevation.  We talked about support stockings but she has a difficult time putting on.  On standing dose of Lasix 20 mg daily with PRN extra dosing if necessary for edema.  We discussed the sliding scale regimen.Marland Kitchen

## 2022-02-23 NOTE — Assessment & Plan Note (Signed)
Mild to moderate use EF of 40 to 45% on echo.  NYHA class II symptoms.  I suspect this is probably related to hypertensive heart disease. Simply continue to treat blood pressure with amlodipine and enalapril along with reduced dose of Lasix.

## 2022-03-12 ENCOUNTER — Other Ambulatory Visit: Payer: Self-pay | Admitting: Cardiology

## 2022-03-19 ENCOUNTER — Telehealth: Payer: Self-pay | Admitting: *Deleted

## 2022-03-19 ENCOUNTER — Encounter: Payer: Self-pay | Admitting: *Deleted

## 2022-03-19 DIAGNOSIS — I10 Essential (primary) hypertension: Secondary | ICD-10-CM

## 2022-03-19 NOTE — Patient Instructions (Signed)
Visit Information  Thank you for taking time to visit with me today. Please don't hesitate to contact me if I can be of assistance to you before our next scheduled telephone appointment.  Following are the goals we discussed today:  Call to schedule follow up and AWV with PCP  Our next appointment is by telephone on 11/8   Please call the care guide team at [phone] if you need to cancel or reschedule your appointment.   Please call the Suicide and Crisis Lifeline: 988 call the [place] National Suicide Prevention Lifeline: 805-208-2638 or TTY: 705-840-7707 TTY 301-726-1925) to talk to a trained counselor call 1-800-273-TALK (toll free, 24 hour hotline) call the [facility]: ([phone] call 911 if you are experiencing a Mental Health or [place] or need someone to talk to.  Patient verbalizes understanding of instructions and care plan provided today and agrees to view in [place]. Active MyChart status and patient understanding of how to access instructions and care plan via MyChart confirmed with patient.     The patient has been provided with contact information for the care management team and has been advised to call with any health related questions or concerns.   [place] Management Care Management Coordinator [phone]

## 2022-03-19 NOTE — Patient Outreach (Signed)
  Care Coordination   Initial Visit Note   03/19/2022 Name: Angelica [name] MRN: [id number] DOB: 10/15/1932  Angelica [name] is a 88 y.o. year old female who sees [name], MD for primary care. I spoke with  Angelica [name] and [name] by phone today.  What matters to the patients health and wellness today?  Managing health fairly well.  Living alone since her husband passed away this summer.  Daughter lives in the area or support.  Denies any urgent concerns, encouraged to contact this care manager with questions.     Goals Addressed             This Visit's Progress    Care Coordination Activities       Care Coordination Interventions: Patient interviewed about adult health maintenance status including  Falls risk assessment    Regular eye checkups Regular Dental Care    Blood Pressure    Advised patient to discuss  Pneumonia Vaccine Influenza Vaccine COVID vaccination    with primary care provider  Provided education about need for yearly AWV.  Encouraged to schedule with PCP Referral placed to care guide for transportation and prepared food resources Referral placed to pharmacy team to follow up on process for medication management and mail order         SDOH assessments and interventions completed:  Yes  SDOH Interventions Today    Flowsheet Row Most Recent Value  SDOH Interventions   Food Insecurity Interventions Intervention Not Indicated  Housing Interventions Intervention Not Indicated  Transportation Interventions Intervention Not Indicated  Utilities Interventions Intervention Not Indicated        Care Coordination Interventions Activated:  Yes  Care Coordination Interventions:  Yes, provided   Follow up plan: Follow up call scheduled for 11/8    Encounter Outcome:  Pt. Visit Completed   Valente David, RN, MSN, [place] Care Management Care Management Coordinator [phone]

## 2022-03-21 ENCOUNTER — Telehealth: Payer: Self-pay

## 2022-03-21 NOTE — Telephone Encounter (Signed)
   Telephone encounter was:  Unsuccessful.  03/21/2022 Name: Angelica [name] MRN: [id number] DOB: February 23, 1933  Unsuccessful outbound call made today to assist with:   meals on wheels.  Outreach Attempt:  1st Attempt  A HIPAA compliant voice message was left requesting a return call.  Instructed patient to call back at [phone].  [place] Resource Care Guide   ??[name]  ?? [phone]   Website: [email or web address]  [email or web address]

## 2022-03-21 NOTE — Telephone Encounter (Signed)
   Telephone encounter was:  Unsuccessful.  03/21/2022 Name: NAIARA LOMBARDOZZI MRN: [id number] DOB: [birth year]  Unsuccessful outbound call made today to assist with:   Meals on Wheels  Outreach Attempt:  1st Attempt  A HIPAA compliant voice message was left requesting a return call.  Instructed patient to call back at [phone].  [place] Resource Care Guide   ??[email or web address]  ?? [phone]   Website: [email or web address]  [email or web address]

## 2022-03-22 ENCOUNTER — Telehealth: Payer: Self-pay

## 2022-03-22 NOTE — Telephone Encounter (Signed)
   Telephone encounter was:  Unsuccessful.  03/22/2022 Name: Angelica [name] MRN: [id number] DOB: 20-Sep-1932  Unsuccessful outbound call made today to assist with:  Transportation Needs  and Food Insecurity  Outreach Attempt:  2nd Attempt  A HIPAA compliant voice message was left requesting a return call.  Instructed patient to call back at [phone].  [place] Resource Care Guide   ??[email or web address]  ?? [phone]   Website: [email or web address]  [email or web address]

## 2022-03-26 ENCOUNTER — Telehealth: Payer: Self-pay

## 2022-03-26 NOTE — Telephone Encounter (Signed)
   Telephone encounter was:  Unsuccessful.  03/26/2022 Name: Angelica [name] MRN: [id number] DOB: 1933/06/08  Unsuccessful outbound call made today to assist with:  Transportation Needs  and Food Insecurity  Outreach Attempt:  3rd Attempt.  Referral closed unable to contact patient.  A HIPAA compliant voice message was left requesting a return call.  Instructed patient to call back at [phone]. Left message on voicemail for patient's daughter [name] to return my call regarding meals on wheels and [facility] transportation referrals placed. [facility] and [employer] have referrals for the patient.  The patient will need to call [facility] when transportation is needed [phone] ext [phone], also informed the wait list for meals on wheels is 9 months.  [place] Resource Care Guide   ??[name]  ?? [phone]   Website: [email or web address]  [email or web address]

## 2022-04-06 DIAGNOSIS — M47816 Spondylosis without myelopathy or radiculopathy, lumbar region: Secondary | ICD-10-CM | POA: Diagnosis not present

## 2022-04-06 DIAGNOSIS — E039 Hypothyroidism, unspecified: Secondary | ICD-10-CM | POA: Diagnosis not present

## 2022-04-06 DIAGNOSIS — Z6826 Body mass index (BMI) 26.0-26.9, adult: Secondary | ICD-10-CM | POA: Diagnosis not present

## 2022-04-06 DIAGNOSIS — Z0001 Encounter for general adult medical examination with abnormal findings: Secondary | ICD-10-CM | POA: Diagnosis not present

## 2022-04-06 DIAGNOSIS — D518 Other vitamin B12 deficiency anemias: Secondary | ICD-10-CM | POA: Diagnosis not present

## 2022-04-06 DIAGNOSIS — I700 Atherosclerosis of aorta: Secondary | ICD-10-CM | POA: Diagnosis not present

## 2022-04-06 DIAGNOSIS — E559 Vitamin D deficiency, unspecified: Secondary | ICD-10-CM | POA: Diagnosis not present

## 2022-04-06 DIAGNOSIS — Z1331 Encounter for screening for depression: Secondary | ICD-10-CM | POA: Diagnosis not present

## 2022-04-06 DIAGNOSIS — I10 Essential (primary) hypertension: Secondary | ICD-10-CM | POA: Diagnosis not present

## 2022-04-06 DIAGNOSIS — C50919 Malignant neoplasm of unspecified site of unspecified female breast: Secondary | ICD-10-CM | POA: Diagnosis not present

## 2022-04-17 ENCOUNTER — Telehealth: Payer: Self-pay | Admitting: *Deleted

## 2022-04-17 NOTE — Progress Notes (Signed)
  Care Coordination Note  04/17/2022 Name: Angelica [name] MRN: [id number] DOB: [birth year]  Angelica [name] is a 88 y.o. year old female who is a primary care patient of [name], MD and is actively engaged with the care management team. I reached out to Angelica [name] by phone today to assist with re-scheduling a follow up visit with the RN Case Manager  Follow up plan: Unsuccessful telephone outreach attempt made. A HIPAA compliant phone message was left for the patient providing contact information and requesting a return call.   [place]  Direct Dial: [phone]

## 2022-04-18 ENCOUNTER — Encounter: Payer: Self-pay | Admitting: *Deleted

## 2022-05-02 NOTE — Telephone Encounter (Signed)
  Care Coordination Note  05/02/2022 Name: EMSLEY CUSTER MRN: [id number] DOB: February 15, 1933  Angelica [name] is a 88 y.o. year old female who is a primary care patient of [name], MD and is actively engaged with the care management team. I reached out to Angelica [name] by phone today to assist with re-scheduling a follow up visit with the RN Case Manager  Follow up plan: Unsuccessful telephone outreach attempt made. A HIPAA compliant phone message was left for the patient providing contact information and requesting a return call.   [place]  Direct Dial: [phone]

## 2022-05-07 NOTE — Progress Notes (Signed)
  Care Coordination Note  05/07/2022 Name: [name] MRN: [id number] DOB: [birth year]  Angelica [name] is a 88 y.o. year old female who is a primary care patient of [name], MD and is actively engaged with the care management team. I reached out to Angelica [name] by phone today to assist with re-scheduling a follow up visit with the RN Case Manager  Follow up plan: Unsuccessful telephone outreach attempt made. A HIPAA compliant phone message was left for the patient providing contact information and requesting a return call.  We have been unable to make contact with the patient for follow up. The care management team is available to follow up with the patient after provider conversation with the patient regarding recommendation for care management engagement and subsequent re-referral to the care management team.   [place]  Direct Dial: [phone]

## 2022-06-06 DIAGNOSIS — U071 COVID-19: Secondary | ICD-10-CM | POA: Diagnosis not present

## 2022-06-06 DIAGNOSIS — M159 Polyosteoarthritis, unspecified: Secondary | ICD-10-CM | POA: Diagnosis not present

## 2022-06-06 DIAGNOSIS — I10 Essential (primary) hypertension: Secondary | ICD-10-CM | POA: Diagnosis not present

## 2022-06-06 DIAGNOSIS — M5030 Other cervical disc degeneration, unspecified cervical region: Secondary | ICD-10-CM | POA: Diagnosis not present

## 2022-08-31 ENCOUNTER — Encounter: Payer: Self-pay | Admitting: Cardiology

## 2022-08-31 ENCOUNTER — Ambulatory Visit: Payer: Medicare Other | Attending: Cardiology | Admitting: Cardiology

## 2022-08-31 VITALS — BP 108/74 | HR 54 | Ht 59.0 in | Wt 139.8 lb

## 2022-08-31 DIAGNOSIS — I420 Dilated cardiomyopathy: Secondary | ICD-10-CM | POA: Insufficient documentation

## 2022-08-31 DIAGNOSIS — R600 Localized edema: Secondary | ICD-10-CM | POA: Diagnosis not present

## 2022-08-31 DIAGNOSIS — I10 Essential (primary) hypertension: Secondary | ICD-10-CM | POA: Diagnosis not present

## 2022-08-31 DIAGNOSIS — I2510 Atherosclerotic heart disease of native coronary artery without angina pectoris: Secondary | ICD-10-CM | POA: Diagnosis not present

## 2022-08-31 DIAGNOSIS — E782 Mixed hyperlipidemia: Secondary | ICD-10-CM | POA: Insufficient documentation

## 2022-08-31 DIAGNOSIS — I110 Hypertensive heart disease with heart failure: Secondary | ICD-10-CM

## 2022-08-31 NOTE — Progress Notes (Signed)
Primary Care Provider: [name], [place] Cardiologist: Glenetta Hew, MD Electrophysiologist: None  Clinic Note: Chief Complaint  Patient presents with   Follow-up    Doing well. Trivial swelling R>L ankle    ===================================  ASSESSMENT/PLAN   Problem List Items Addressed This Visit       Cardiology Problems   Hyperlipidemia (Chronic)    LDL target is < 100.  Continue current dose of atorvastatin.  Labs followed by PCP, not available..      Essential hypertension (Chronic)    BP is pretty well-controlled on current meds.  I am a little concerned with her having some positional dizziness.  Recommendations:  If you are feeing dizzy - you can hold Amloidpine that day    If you come done with  cold or feeling little sick/or poorly   - do not take Furosemide(lasix)  or Enalapril  until you feel better      Dilated cardiomyopathy (HCC) (Chronic)    EF 40 to 45% by echo.  NYHA class I-II symptoms.  Mild edema.  Continue current dose of Toprol, amlodipine and enalapril along with standing dose of furosemide.  Has not required additional dosing of furosemide.  She is has mild lower extremity swelling.  I suspect this is not CHF related due to lack of PND or orthopnea. Recommend compression socks for travel, and otherwise foot elevation.      Relevant Orders   EKG 12-Lead (Completed)   Coronary artery disease, non-occlusive - Primary (Chronic)    Nonischemic Myoview just over a year ago and no further anginal symptoms.  Mild nonischemic cardiomyopathy based on reduced EF.  Plan is to continue medical management.  She is on amlodipine and Toprol both for antianginal/microvascular disease. Is also on moderate dose atorvastatin with last being followed by PCP. On aspirin 81 mg.  In the absence of symptoms, would probably not be more aggressive.         Relevant Orders   EKG 12-Lead (Completed)     Other   Bilateral lower  extremity edema (Chronic)    As per CHF section: All STEMI dose of Lasix, most likely venous stasis related with no heart failure symptoms.  Continue to recommend foot elevation.  Otherwise: Recommended compression socks:  2 sets  one set  graduation 8-15 mg ,2nd set  20-30 mg ---  wear the right  leg the 20- 30 mmHg   and left one 8-15 mmHg   do not wear at bedtime. ( Only while traveling)       ===================================  HPI:    Angelica [name] is a 89 y.o. female with a PMH notable for mild hypertensive heart disease with EF of 40 to 45% and nonocclusive CAD who presents today for 6 month f/u at the request of [name], MD.  Transthoracic Echo 06/28/2021: EF 40 to 45%.  Mildly decreased function.  No RWMA.  GR 1 DD-mild LA dilation..  Normal PASP with mildly elevated RAP.  Normal RV function. Myoview 06/01/2021: Reduced LVEF of 40 to 45%.  No evidence of ischemia or infarction.  Read as intermediate risk because of reduced function.  Angelica [name] was last seen in Sept -> this was the first time seeing her since her husband's death.  She actually noted that not having the stress of the MD with his healthcare was actually helping her her level stressed out and she was doing better.  No longer having the chest pain or  pressure.  Mild right greater than left leg swelling.  Off-and-on dizzy spells.  No PND orthopnea.  Mild exertional dyspnea, but not very active.  Always on the go, but not exercising.  Fatigue is notably improved since she was sleeping better. Refilled low-dose Lasix  Recent Hospitalizations: None  Reviewed  CV studies:    The following studies were reviewed today: (if available, images/films reviewed: From Epic Chart or Care Everywhere) None:  Interval History:   Angelica [name] is here today with a lot of her daughter's in great spirits.  She is doing quite well.  She says that she is able to sleep the whole night long now.  Not waking up with any issues of  dyspnea or insomnia. She is always on the go and walks around a lot in the house, but does not do any routine exercise.  She says she has occasional right greater than left leg ankle swelling but not significant enough to worry about.  No associated PND orthopnea.  No rapid or irregular heartbeats or palpitations.  She denies any dizziness or lightheadedness, lesion is unless she stands up too quickly and has not very frequently.  No syncope or near syncope.   REVIEWED OF SYSTEMS   Review of Systems  Constitutional:  Negative for malaise/fatigue and weight loss (Stable weights).  HENT:  Negative for congestion and sinus pain.   Respiratory:  Negative for cough and shortness of breath.   Cardiovascular:  Positive for leg swelling (Per HPI).  Gastrointestinal:  Negative for blood in stool and melena.  Genitourinary:  Negative for hematuria.  Musculoskeletal:  Positive for joint pain (Not limiting; bilateral knees).  Neurological:  Positive for dizziness (Mostly positional). Negative for focal weakness and headaches.  Psychiatric/Behavioral:  Negative for depression (Feeling well with her husband's passing.) and memory loss. The patient does not have insomnia.    I have reviewed and (if needed) personally updated the patient's problem list, medications, allergies, past medical and surgical history, social and family history.   PAST MEDICAL HISTORY   Past Medical History:  Diagnosis Date   Anemia    Arthritis    Breast cancer ([place]) 2011   left; no adjuvent therapy, Dr. Sonny Dandy   Chronic bronchitis    Colon polyps    Previous colonoscopy by Dr. Irving Shows   Coronary artery disease, non-occlusive 2006   Roughly 40% RCA.; Negative Myoview in June 2013   Diverticulosis of colon    Gout    Hemorrhoids    HTN (hypertension)    Hyperlipidemia    Hypothyroid    MI (myocardial infarction) ([place]) 1973   By report.    PAST SURGICAL HISTORY   Past Surgical History:  Procedure Laterality  Date   ABDOMINAL HYSTERECTOMY     TAH   CARDIAC CATHETERIZATION  06/11/2004   Roughly 40% RCA lesion, otherwise normal.   CATARACT EXTRACTION W/PHACO  07/02/2011   Procedure: CATARACT EXTRACTION PHACO AND INTRAOCULAR LENS PLACEMENT (IOC);  Surgeon: Williams Che, MD;  Location: [facility];  Service: Ophthalmology;  Laterality: Left;  CDE:3.45   CATARACT EXTRACTION W/PHACO  04/17/2012   Procedure: CATARACT EXTRACTION PHACO AND INTRAOCULAR LENS PLACEMENT (IOC);  Surgeon: Tonny Branch, MD;  Location: [facility];  Service: Ophthalmology;  Laterality: Right;  CDE:13.40   CHOLECYSTECTOMY     COLONOSCOPY      ?3 years ago. Per pt, diverticulosis, hemorrhoids   COLONOSCOPY  04/06/2011   SLF: 1. Sessile polyp at the ileocecal valve 2.  Diverticulosis, moderate 3. Internal hemorrhoids, Large-causing rectal bleeding associated with straining. tubular adenoma.   HEMORRHOID SURGERY     LAPAROSCOPIC LYSIS OF ADHESIONS  06/11/2006   Dr. Zella Richer with repair of incisional hernia noted -    MASTECTOMY  06/11/2009   left   NM MYOVIEW LTD  06/01/2021   Reduced LVEF of 40 to 45%.  No evidence of ischemia or infarction.  Read as intermediate risk because of reduced function.   TRANSTHORACIC ECHOCARDIOGRAM  12/2013   a) June 2013: Normal EF, no significant valve disease.; b) July 2015: EF 50-55%, Gr1 DD, mild LVH.   TRANSTHORACIC ECHOCARDIOGRAM  06/28/2021   Ordered due to low EF on Myoview:  EF 40 to 45%.  Mildly decreased function.  No RWMA.  GR 1 DD-mild LA dilation..  Normal PASP with mildly elevated RAP.  Normal RV function.    Immunization History  Administered Date(s) Administered   Pneumococcal Polysaccharide-23 08/21/2018    MEDICATIONS/ALLERGIES   Current Meds  Medication Sig   allopurinol (ZYLOPRIM) 300 MG tablet Take 300 mg by mouth daily.     amLODipine (NORVASC) 10 MG tablet Take 1 tablet (10 mg total) by mouth daily.   aspirin EC 81 MG tablet Take 81 mg by mouth daily.   atorvastatin  (LIPITOR) 20 MG tablet TAKE (1) TABLET BY MOUTH AT BEDTIME FOR CHOLESTEROL.   enalapril (VASOTEC) 10 MG tablet Take 10 mg by mouth daily.   ergocalciferol (VITAMIN D2) 1.25 MG (50000 UT) capsule Take 1 capsule (50,000 Units total) by mouth once a week.   furosemide (LASIX) 20 MG tablet Take 1 tablet (20 mg total) by mouth daily. Ok to take additional 20 mg AS NEEDED   levothyroxine (SYNTHROID, LEVOTHROID) 50 MCG tablet Take 1 tablet (50 mcg total) by mouth daily.   metoprolol succinate (TOPROL-XL) 50 MG 24 hr tablet Take 50 mg by mouth daily. Take with or immediately following a meal.   Multiple Vitamins-Minerals (MULTIVITAMINS THER. W/MINERALS) TABS Take 1 tablet by mouth daily.     nitroGLYCERIN (NITROSTAT) 0.4 MG SL tablet DISSOLVE 1 TABLET UNDER TONGUE EVERY 5 MINUTES AS NEEDED FOR CHEST PAIN.   pantoprazole (PROTONIX) 40 MG tablet TAKE ONE TABLET BY MOUTH DAILY.   potassium chloride SA (KLOR-CON M) 20 MEQ tablet TAKE ONE TABLET BY MOUTH ONCE DAILY.   Propylene Glycol 0.6 % SOLN Place 1 drop into both eyes 2 (two) times daily.    Allergies  Allergen Reactions   Bee Venom Anaphylaxis   Ciprofloxacin Nausea And Vomiting   Clindamycin Hcl     Does not know   Flagyl [Metronidazole Hcl] Nausea And Vomiting   Shellfish-Derived Products     Rash developed so patient has steered clear of any since last year   Sulfa Antibiotics Nausea Only    SOCIAL HISTORY/FAMILY HISTORY   Reviewed in Epic:  Pertinent findings:  Social History   Tobacco Use   Smoking status: Never   Smokeless tobacco: Never  Substance Use Topics   Alcohol use: No   Drug use: No   Social History   Social History Narrative   She is a very pleasant recently widowed African American woman with 2 children and 3 grandchildren.       She is married to her husband for close to 51 years.  He was a retired Land.     [place] the [place] area with a roughly 1/4 mile  driveway.  She also enjoys doing gardening.    OBJCTIVE -PE, EKG, labs   Wt Readings from Last 3 Encounters:  08/31/22 139 lb 12.8 oz (63.4 kg)  02/23/22 135 lb (61.2 kg)  01/08/22 126 lb 15.8 oz (57.6 kg)    Physical Exam: BP 108/74   Pulse (!) 54   Ht 4\' 11"  (1.499 m)   Wt 139 lb 12.8 oz (63.4 kg)   SpO2 93%   BMI 28.24 kg/m  Physical Exam Vitals reviewed.  Constitutional:      General: She is not in acute distress.    Appearance: Normal appearance. She is normal weight. She is not ill-appearing (Well-nourished, well-groomed.  Healthy-appearing.) or toxic-appearing.  HENT:     Head: Normocephalic and atraumatic.  Neck:     Vascular: No carotid bruit or JVD.  Cardiovascular:     Rate and Rhythm: Regular rhythm. Bradycardia present. No extrasystoles are present.    Chest Wall: PMI is not displaced.     Pulses: Normal pulses.     Heart sounds: S1 normal and S2 normal. Heart sounds are distant. No murmur heard.    No friction rub. No gallop.  Pulmonary:     Effort: Pulmonary effort is normal. No respiratory distress.     Breath sounds: Normal breath sounds. No wheezing, rhonchi or rales.  Chest:     Chest wall: No tenderness.  Musculoskeletal:     Cervical back: Normal range of motion and neck supple.     Right lower leg: Edema (1+) present.     Left lower leg: Edema (trace) present.  Skin:    General: Skin is warm and dry.  Neurological:     General: No focal deficit present.     Mental Status: She is alert and oriented to person, place, and time. Mental status is at baseline.     Motor: No weakness.     Gait: Gait normal.  Psychiatric:        Mood and Affect: Mood normal.        Behavior: Behavior normal.        Thought Content: Thought content normal.        Judgment: Judgment normal.     Adult ECG Report  Rate: 54 ;  Rhythm: sinus bradycardia and apparent lead reversal avL & III -- as compared to prior EKGs ; Otherwise normal asix, interval & durations   Narrative Interpretation: difficult to interpret due to incorrect lead placement  Recent Labs:  reviewed  No results found for: "CHOL", "HDL", "LDLCALC", "LDLDIRECT", "TRIG", "CHOLHDL" Lab Results  Component Value Date   CREATININE 0.84 01/05/2022   BUN 24 (H) 01/05/2022   NA 138 01/05/2022   K 4.4 01/05/2022   CL 100 01/05/2022   CO2 29 01/05/2022      Latest Ref Rng & Units 01/05/2022    9:22 AM 12/29/2020   10:46 AM 12/23/2019    9:47 AM  CBC  WBC 4.0 - 10.5 K/uL 6.6  6.7  7.6   Hemoglobin 12.0 - 15.0 g/dL 11.5  11.8  11.4   Hematocrit 36.0 - 46.0 % 36.2  37.5  36.9   Platelets 150 - 400 K/uL 184  161  163     Lab Results  Component Value Date   HGBA1C 5.5 08/20/2018   Lab Results  Component Value Date   TSH 0.82 04/03/2018    ================================================== I spent a total of 84minutes with the patient spent in direct patient consultation.  Additional time  spent with chart review  / charting (studies, outside notes, etc): 15 min Total Time: 42 min  Current medicines are reviewed at length with the patient today.  (+/- concerns) n/a  Notice: This dictation was prepared with Dragon dictation along with smart phrase technology. Any transcriptional errors that result from this process are unintentional and may not be corrected upon review.  Studies Ordered:   Orders Placed This Encounter  Procedures   EKG 12-Lead   No orders of the defined types were placed in this encounter.   Patient Instructions / Medication Changes & Studies & Tests Ordered   Patient Instructions  Medication Instructions:   If you are feeing dizzy - youcan not take Amloidpine that day    If you come done with  cold or feeling little sick/or poorly   - do not take Furosemide(lasix)  or Lisinopril  until you feel better *If you need a refill on your cardiac medications before your next appointment, please call your pharmacy*   Lab Work: Not  needed    Testing/Procedures:  Not needed  Follow-Up: At [facility], you and your health needs are our priority.  As part of our continuing mission to provide you with exceptional heart care, we have created designated Provider Care Teams.  These Care Teams include your primary Cardiologist (physician) and Advanced Practice Providers (APPs -  Physician Assistants and Nurse Practitioners) who all work together to provide you with the care you need, when you need it.     Your next appointment:   12 month(s)  The format for your next appointment:   In Person  Provider:   Glenetta Hew, MD    Other Instructions    When you travel wear  compression socks  - purchase to 2 sets  one set  graduation 8-15 mg ,2nd set  20-30 mg ---  wear the right  leg the 20- 30 mmHg   and left one 8-15 mmHg   do not wear at bedtime. ( Only while traveling)      Leonie Man, MD, MS Glenetta Hew, M.D., M.S. Interventional Cardiologist  [facility]  Pager # [phone] Phone # [phone] [address] [place], [place] [address]   Thank you for choosing [facility]!!

## 2022-08-31 NOTE — Patient Instructions (Signed)
Medication Instructions:   If you are feeing dizzy - youcan not take Amloidpine that day    If you come done with  cold or feeling little sick/or poorly   - do not take Furosemide(lasix)  or Lisinopril  until you feel better *If you need a refill on your cardiac medications before your next appointment, please call your pharmacy*   Lab Work: Not needed    Testing/Procedures:  Not needed  Follow-Up: At [facility], you and your health needs are our priority.  As part of our continuing mission to provide you with exceptional heart care, we have created designated Provider Care Teams.  These Care Teams include your primary Cardiologist (physician) and Advanced Practice Providers (APPs -  Physician Assistants and Nurse Practitioners) who all work together to provide you with the care you need, when you need it.     Your next appointment:   12 month(s)  The format for your next appointment:   In Person  Provider:   Glenetta Hew, MD    Other Instructions    When you travel wear  compression socks  - purchase to 2 sets  one set  graduation 8-15 mg ,2nd set  20-30 mg ---  wear the right  leg the 20- 30 mmHg   and left one 8-15 mmHg   do not wear at bedtime. ( Only while traveling)

## 2022-09-07 ENCOUNTER — Encounter: Payer: Self-pay | Admitting: Cardiology

## 2022-09-07 NOTE — Assessment & Plan Note (Signed)
BP is pretty well-controlled on current meds.  I am a little concerned with her having some positional dizziness.  Recommendations:  If you are feeing dizzy - you can hold Amloidpine that day    If you come done with  cold or feeling little sick/or poorly   - do not take Furosemide(lasix)  or Enalapril  until you feel better

## 2022-09-07 NOTE — Assessment & Plan Note (Signed)
As per CHF section: All STEMI dose of Lasix, most likely venous stasis related with no heart failure symptoms.  Continue to recommend foot elevation.  Otherwise: Recommended compression socks:  2 sets  one set  graduation 8-15 mg ,2nd set  20-30 mg ---  wear the right  leg the 20- 30 mmHg   and left one 8-15 mmHg   do not wear at bedtime. ( Only while traveling)

## 2022-09-07 NOTE — Assessment & Plan Note (Signed)
LDL target is < 100.  Continue current dose of atorvastatin.  Labs followed by PCP, not available.Marland Kitchen

## 2022-09-07 NOTE — Assessment & Plan Note (Signed)
Nonischemic Myoview just over a year ago and no further anginal symptoms.  Mild nonischemic cardiomyopathy based on reduced EF.  Plan is to continue medical management.  She is on amlodipine and Toprol both for antianginal/microvascular disease. Is also on moderate dose atorvastatin with last being followed by PCP. On aspirin 81 mg.  In the absence of symptoms, would probably not be more aggressive.

## 2022-09-07 NOTE — Assessment & Plan Note (Signed)
EF 40 to 45% by echo.  NYHA class I-II symptoms.  Mild edema.  Continue current dose of Toprol, amlodipine and enalapril along with standing dose of furosemide.  Has not required additional dosing of furosemide.  She is has mild lower extremity swelling.  I suspect this is not CHF related due to lack of PND or orthopnea. Recommend compression socks for travel, and otherwise foot elevation.

## 2022-09-21 DIAGNOSIS — E039 Hypothyroidism, unspecified: Secondary | ICD-10-CM | POA: Diagnosis not present

## 2022-09-21 DIAGNOSIS — I10 Essential (primary) hypertension: Secondary | ICD-10-CM | POA: Diagnosis not present

## 2022-09-21 DIAGNOSIS — E042 Nontoxic multinodular goiter: Secondary | ICD-10-CM | POA: Diagnosis not present

## 2022-09-21 DIAGNOSIS — E063 Autoimmune thyroiditis: Secondary | ICD-10-CM | POA: Diagnosis not present

## 2022-09-21 DIAGNOSIS — E876 Hypokalemia: Secondary | ICD-10-CM | POA: Diagnosis not present

## 2022-09-21 DIAGNOSIS — E785 Hyperlipidemia, unspecified: Secondary | ICD-10-CM | POA: Diagnosis not present

## 2022-09-27 DIAGNOSIS — E876 Hypokalemia: Secondary | ICD-10-CM | POA: Diagnosis not present

## 2022-09-27 DIAGNOSIS — E063 Autoimmune thyroiditis: Secondary | ICD-10-CM | POA: Diagnosis not present

## 2022-09-27 DIAGNOSIS — I10 Essential (primary) hypertension: Secondary | ICD-10-CM | POA: Diagnosis not present

## 2022-09-27 DIAGNOSIS — K219 Gastro-esophageal reflux disease without esophagitis: Secondary | ICD-10-CM | POA: Diagnosis not present

## 2022-09-27 DIAGNOSIS — E039 Hypothyroidism, unspecified: Secondary | ICD-10-CM | POA: Diagnosis not present

## 2022-09-27 DIAGNOSIS — E785 Hyperlipidemia, unspecified: Secondary | ICD-10-CM | POA: Diagnosis not present

## 2022-09-27 DIAGNOSIS — E042 Nontoxic multinodular goiter: Secondary | ICD-10-CM | POA: Diagnosis not present

## 2022-10-15 DIAGNOSIS — H524 Presbyopia: Secondary | ICD-10-CM | POA: Diagnosis not present

## 2023-01-04 ENCOUNTER — Ambulatory Visit (HOSPITAL_COMMUNITY)
Admission: RE | Admit: 2023-01-04 | Discharge: 2023-01-04 | Disposition: A | Discharge status: Home / Self Care | Payer: Medicare Other | Source: Ambulatory Visit | Attending: Physician Assistant | Admitting: Physician Assistant

## 2023-01-04 ENCOUNTER — Inpatient Hospital Stay: Payer: Medicare Other | Attending: Hematology

## 2023-01-04 DIAGNOSIS — C50912 Malignant neoplasm of unspecified site of left female breast: Secondary | ICD-10-CM

## 2023-01-04 DIAGNOSIS — Z1231 Encounter for screening mammogram for malignant neoplasm of breast: Secondary | ICD-10-CM | POA: Diagnosis not present

## 2023-01-04 DIAGNOSIS — M8580 Other specified disorders of bone density and structure, unspecified site: Secondary | ICD-10-CM | POA: Diagnosis not present

## 2023-01-04 DIAGNOSIS — I890 Lymphedema, not elsewhere classified: Secondary | ICD-10-CM | POA: Diagnosis not present

## 2023-01-04 DIAGNOSIS — E559 Vitamin D deficiency, unspecified: Secondary | ICD-10-CM | POA: Insufficient documentation

## 2023-01-04 DIAGNOSIS — Z862 Personal history of diseases of the blood and blood-forming organs and certain disorders involving the immune mechanism: Secondary | ICD-10-CM | POA: Insufficient documentation

## 2023-01-04 DIAGNOSIS — M7989 Other specified soft tissue disorders: Secondary | ICD-10-CM | POA: Diagnosis not present

## 2023-01-04 DIAGNOSIS — Z853 Personal history of malignant neoplasm of breast: Secondary | ICD-10-CM | POA: Diagnosis not present

## 2023-01-04 DIAGNOSIS — D649 Anemia, unspecified: Secondary | ICD-10-CM

## 2023-01-04 LAB — CBC WITH DIFFERENTIAL/PLATELET
Abs Immature Granulocytes: 0.02 10*3/uL (ref 0.00–0.07)
Basophils Absolute: 0.1 10*3/uL (ref 0.0–0.1)
Basophils Relative: 1 %
Eosinophils Absolute: 0.3 10*3/uL (ref 0.0–0.5)
Eosinophils Relative: 5 %
HCT: 35.6 % — ABNORMAL LOW (ref 36.0–46.0)
Hemoglobin: 11.3 g/dL — ABNORMAL LOW (ref 12.0–15.0)
Immature Granulocytes: 0 %
Lymphocytes Relative: 28 %
Lymphs Abs: 2 10*3/uL (ref 0.7–4.0)
MCH: 31.3 pg (ref 26.0–34.0)
MCHC: 31.7 g/dL (ref 30.0–36.0)
MCV: 98.6 fL (ref 80.0–100.0)
Monocytes Absolute: 0.5 10*3/uL (ref 0.1–1.0)
Monocytes Relative: 7 %
Neutro Abs: 4.2 10*3/uL (ref 1.7–7.7)
Neutrophils Relative %: 59 %
Platelets: 209 10*3/uL (ref 150–400)
RBC: 3.61 MIL/uL — ABNORMAL LOW (ref 3.87–5.11)
RDW: 13.9 % (ref 11.5–15.5)
WBC: 7.1 10*3/uL (ref 4.0–10.5)
nRBC: 0 % (ref 0.0–0.2)

## 2023-01-04 LAB — IRON AND TIBC
Iron: 73 ug/dL (ref 28–170)
Saturation Ratios: 26 % (ref 10.4–31.8)
TIBC: 283 ug/dL (ref 250–450)
UIBC: 210 ug/dL

## 2023-01-04 LAB — COMPREHENSIVE METABOLIC PANEL
ALT: 12 U/L (ref 0–44)
AST: 21 U/L (ref 15–41)
Albumin: 4.1 g/dL (ref 3.5–5.0)
Alkaline Phosphatase: 66 U/L (ref 38–126)
Anion gap: 10 (ref 5–15)
BUN: 16 mg/dL (ref 8–23)
CO2: 27 mmol/L (ref 22–32)
Calcium: 9.1 mg/dL (ref 8.9–10.3)
Chloride: 100 mmol/L (ref 98–111)
Creatinine, Ser: 0.95 mg/dL (ref 0.44–1.00)
GFR, Estimated: 58 mL/min — ABNORMAL LOW (ref >60)
Glucose, Bld: 106 mg/dL — ABNORMAL HIGH (ref 70–99)
Potassium: 4.7 mmol/L (ref 3.5–5.1)
Sodium: 137 mmol/L (ref 135–145)
Total Bilirubin: 1.2 mg/dL (ref 0.3–1.2)
Total Protein: 8.2 g/dL — ABNORMAL HIGH (ref 6.5–8.1)

## 2023-01-04 LAB — FERRITIN: Ferritin: 104 ng/mL (ref 11–307)

## 2023-01-04 LAB — VITAMIN D 25 HYDROXY (VIT D DEFICIENCY, FRACTURES): Vit D, 25-Hydroxy: 56.93 ng/mL (ref 30–100)

## 2023-01-14 ENCOUNTER — Inpatient Hospital Stay: Payer: Medicare Other | Attending: Physician Assistant | Admitting: Oncology

## 2023-01-14 ENCOUNTER — Ambulatory Visit: Payer: Medicare Other | Admitting: Physician Assistant

## 2023-01-14 VITALS — BP 128/69 | HR 70 | Temp 97.9°F | Resp 16 | Wt 144.4 lb

## 2023-01-14 DIAGNOSIS — Z8042 Family history of malignant neoplasm of prostate: Secondary | ICD-10-CM | POA: Diagnosis not present

## 2023-01-14 DIAGNOSIS — E559 Vitamin D deficiency, unspecified: Secondary | ICD-10-CM | POA: Insufficient documentation

## 2023-01-14 DIAGNOSIS — Z853 Personal history of malignant neoplasm of breast: Secondary | ICD-10-CM | POA: Insufficient documentation

## 2023-01-14 DIAGNOSIS — Z9012 Acquired absence of left breast and nipple: Secondary | ICD-10-CM | POA: Insufficient documentation

## 2023-01-14 DIAGNOSIS — Z90710 Acquired absence of both cervix and uterus: Secondary | ICD-10-CM | POA: Insufficient documentation

## 2023-01-14 DIAGNOSIS — I890 Lymphedema, not elsewhere classified: Secondary | ICD-10-CM | POA: Insufficient documentation

## 2023-01-14 DIAGNOSIS — M7989 Other specified soft tissue disorders: Secondary | ICD-10-CM | POA: Diagnosis not present

## 2023-01-14 DIAGNOSIS — Z1231 Encounter for screening mammogram for malignant neoplasm of breast: Secondary | ICD-10-CM

## 2023-01-14 DIAGNOSIS — M8580 Other specified disorders of bone density and structure, unspecified site: Secondary | ICD-10-CM | POA: Diagnosis not present

## 2023-01-14 DIAGNOSIS — R634 Abnormal weight loss: Secondary | ICD-10-CM | POA: Insufficient documentation

## 2023-01-14 DIAGNOSIS — Z803 Family history of malignant neoplasm of breast: Secondary | ICD-10-CM | POA: Insufficient documentation

## 2023-01-14 DIAGNOSIS — E079 Disorder of thyroid, unspecified: Secondary | ICD-10-CM | POA: Insufficient documentation

## 2023-01-14 NOTE — Progress Notes (Signed)
[facility] [address], [place] [address]   CLINIC:  Medical Oncology/Hematology  PCP:  Angelica Nevins, MD [address] / [place] [address] [phone]   REASON FOR VISIT:  Follow-up for history of multicentric stage I left breast cancer, triple negative   PRIOR THERAPY: Left total mastectomy with sentinel lymph node biopsies (03/21/2010)   NGS Results: None available   CURRENT THERAPY: Surveillance  BRIEF ONCOLOGIC HISTORY:  Oncology History  Breast cancer (HCC)  03/14/2011 Initial Diagnosis   Breast cancer (HCC)   11/14/2016 Mammogram   BIRADS 1     CANCER STAGING:  Cancer Staging  No matching staging information was found for the patient.  INTERVAL HISTORY:  [name], a 89 y.o. female, returns for routine follow-up of her history of left-sided breast cancer. Angelica [name] was last seen on 01/08/22 by Angelica Brenner PA-C.   At today's visit, she  reports feeling fairly well.  She denies any recent hospitalizations, surgeries, or changes in her  baseline health status.  Most recent mammogram (unilateral, right breast) on 01/04/2023 showed no evidence of malignancy.  She reports noticing some bilateral lower extremity swelling over the past 6 months.  She is currently taking a diuretic Lasix 20 mg daily.  She has follow-up with PCP in the next few weeks and will bring this to their attention.  She has not been using compression stockings.  She notes worsening swelling in the evening and improvement in the morning.  Would like to be referred to a new endocrinologist.  Previous endocrinologist in [place] retired July 31.  She denies any new symptoms of recurrent such as lumps or bumps or bone pain.  Her weight has increased and she weighs 144 pounds today.  She continues to notice right-sided nontender lymphadenopathy which previously was worked up and did not reveal anything that was concerning.  Reports occasionally having  trouble swallowing large pills but otherwise it is nonbothersome.  Having occasional left arm swelling and numbness.  Previously was fitted for a lymphedema sleeve and bras but it has been several years.  She is unsure of where these items are now and is wondering if a new prescription can be sent.   Appetite is 100% energy level 75%.  She and her family will be going on their first cruise this October.  REVIEW OF SYSTEMS:  Review of Systems  Respiratory:  Positive for shortness of breath.   Cardiovascular:  Positive for palpitations.  Neurological:  Positive for numbness.    PAST MEDICAL/SURGICAL HISTORY:  Past Medical History:  Diagnosis Date   Anemia    Arthritis    Breast cancer (HCC) 2011   left; no adjuvent therapy, Dr. Cleone [name]   Chronic bronchitis    Colon polyps    Previous colonoscopy by Dr. Elpidio [name]   Coronary artery disease, non-occlusive 2006   Roughly 40% RCA.; Negative Myoview in June 2013   Diverticulosis of colon    Gout    Hemorrhoids    HTN (hypertension)    Hyperlipidemia    Hypothyroid    MI (myocardial infarction) (HCC) 1973   By report.   Past Surgical History:  Procedure Laterality Date   ABDOMINAL HYSTERECTOMY     TAH   CARDIAC CATHETERIZATION  06/11/2004   Roughly 40% RCA lesion, otherwise normal.   CATARACT EXTRACTION W/PHACO  07/02/2011   Procedure: CATARACT EXTRACTION PHACO AND INTRAOCULAR LENS PLACEMENT (IOC);  Surgeon: Angelica Simmonds, MD;  Location: [facility];  Service: Ophthalmology;  Laterality: Left;  CDE:3.45   CATARACT EXTRACTION W/PHACO  04/17/2012   Procedure: CATARACT EXTRACTION PHACO AND INTRAOCULAR LENS PLACEMENT (IOC);  Surgeon: Angelica Payor, MD;  Location: [facility];  Service: Ophthalmology;  Laterality: Right;  CDE:13.40   CHOLECYSTECTOMY     COLONOSCOPY      ?3 years ago. Per pt, diverticulosis, hemorrhoids   COLONOSCOPY  04/06/2011   SLF: 1. Sessile polyp at the ileocecal valve 2. Diverticulosis, moderate 3. Internal hemorrhoids,  Large-causing rectal bleeding associated with straining. tubular adenoma.   HEMORRHOID SURGERY     LAPAROSCOPIC LYSIS OF ADHESIONS  06/11/2006   Dr. Abbey [name] with repair of incisional hernia noted -    MASTECTOMY  06/11/2009   left   NM MYOVIEW LTD  06/01/2021   Reduced LVEF of 40 to 45%.  No evidence of ischemia or infarction.  Read as intermediate risk because of reduced function.   TRANSTHORACIC ECHOCARDIOGRAM  12/2013   a) June 2013: Normal EF, no significant valve disease.; b) July 2015: EF 50-55%, Gr1 DD, mild LVH.   TRANSTHORACIC ECHOCARDIOGRAM  06/28/2021   Ordered due to low EF on Myoview:  EF 40 to 45%.  Mildly decreased function.  No RWMA.  GR 1 DD-mild LA dilation..  Normal PASP with mildly elevated RAP.  Normal RV function.    SOCIAL HISTORY:  Social History   Socioeconomic History   Marital status: Married    Spouse name: Not on file   Number of children: 2   Years of education: Not on file   Highest education level: Not on file  Occupational History   Occupation: retired    Associate Professor: RETIRED  Tobacco Use   Smoking status: Never   Smokeless tobacco: Never  Substance and Sexual Activity   Alcohol use: No   Drug use: No   Sexual activity: Not Currently  Other Topics Concern   Not on file  Social History Narrative   She is a very pleasant recently widowed African American woman with 2 children and 3 grandchildren.       She is married to her husband for close to 60 years.  He was a retired [employer] worker.     She continues [place] in the [place] area with a roughly 1/4 mile driveway.    She also enjoys doing gardening.   Social Determinants of Health   Financial Resource Strain: Not on file  Food Insecurity: No Food Insecurity (03/19/2022)   Hunger Vital Sign    Worried About Running Out of Food in the Last Year: Never true    Ran Out of Food in the Last Year: Never true  Transportation Needs: No Transportation Needs  (03/19/2022)   PRAPARE - Administrator, Civil Service (Medical): No    Lack of Transportation (Non-Medical): No  Physical Activity: Not on file  Stress: Not on file  Social Connections: Not on file  Intimate Partner Violence: Not on file    FAMILY HISTORY:  Family History  Problem Relation Age of Onset   Prostate cancer Father    Breast cancer Sister    Breast cancer Other        paternal niece   Colon cancer Neg Hx    Anesthesia problems Neg Hx    Hypotension Neg Hx    Malignant hyperthermia Neg Hx    Pseudochol deficiency Neg Hx    Liver disease Neg Hx  CURRENT MEDICATIONS:  Current Outpatient Medications  Medication Sig Dispense Refill   allopurinol (ZYLOPRIM) 300 MG tablet Take 300 mg by mouth daily.       amLODipine (NORVASC) 10 MG tablet Take 1 tablet (10 mg total) by mouth daily. 90 tablet 2   aspirin EC 81 MG tablet Take 81 mg by mouth daily.     atorvastatin (LIPITOR) 20 MG tablet TAKE (1) TABLET BY MOUTH AT BEDTIME FOR CHOLESTEROL. 90 tablet 3   enalapril (VASOTEC) 10 MG tablet Take 10 mg by mouth daily.     ergocalciferol (VITAMIN D2) 1.25 MG (50000 UT) capsule Take 1 capsule (50,000 Units total) by mouth once a week. 5 capsule 11   furosemide (LASIX) 20 MG tablet Take 1 tablet (20 mg total) by mouth daily. Ok to take additional 20 mg AS NEEDED 120 tablet 3   levothyroxine (SYNTHROID, LEVOTHROID) 50 MCG tablet Take 1 tablet (50 mcg total) by mouth daily. 90 tablet 3   metoprolol succinate (TOPROL-XL) 50 MG 24 hr tablet Take 50 mg by mouth daily. Take with or immediately following a meal.     Multiple Vitamins-Minerals (MULTIVITAMINS THER. W/MINERALS) TABS Take 1 tablet by mouth daily.       nitroGLYCERIN (NITROSTAT) 0.4 MG SL tablet DISSOLVE 1 TABLET UNDER TONGUE EVERY 5 MINUTES AS NEEDED FOR CHEST PAIN. 25 tablet 4   pantoprazole (PROTONIX) 40 MG tablet TAKE ONE TABLET BY MOUTH DAILY. 90 tablet 3   potassium chloride SA (KLOR-CON M) 20 MEQ tablet TAKE  ONE TABLET BY MOUTH ONCE DAILY. 60 tablet 5   Propylene Glycol 0.6 % SOLN Place 1 drop into both eyes 2 (two) times daily.     No current facility-administered medications for this visit.    ALLERGIES:  Allergies  Allergen Reactions   Bee Venom Anaphylaxis   Ciprofloxacin Nausea And Vomiting   Clindamycin Hcl     Does not know   Flagyl [Metronidazole Hcl] Nausea And Vomiting   Shellfish-Derived Products     Rash developed so patient has steered clear of any since last year   Sulfa Antibiotics Nausea Only    PHYSICAL EXAM:    Performance status (ECOG): 1 - Symptomatic but completely ambulatory  There were no vitals filed for this visit. Wt Readings from Last 3 Encounters:  08/31/22 139 lb 12.8 oz (63.4 kg)  02/23/22 135 lb (61.2 kg)  01/08/22 126 lb 15.8 oz (57.6 kg)   Physical Exam Constitutional:      Appearance: Normal appearance.  Cardiovascular:     Rate and Rhythm: Normal rate and regular rhythm.  Pulmonary:     Effort: Pulmonary effort is normal.     Breath sounds: Normal breath sounds.  Chest:  Breasts:    Right: Normal.     Left: Absent.  Abdominal:     General: Bowel sounds are normal.     Palpations: Abdomen is soft.  Musculoskeletal:        General: No swelling. Normal range of motion.  Lymphadenopathy:     Upper Body:     Right upper body: No axillary adenopathy.     Left upper body: No axillary adenopathy.  Neurological:     Mental Status: She is alert and oriented to person, place, and time. Mental status is at baseline.      LABORATORY DATA:  I have reviewed the labs as listed.     Latest Ref Rng & Units 01/04/2023    9:15 AM 01/05/2022  9:22 AM 12/29/2020   10:46 AM  CBC  WBC 4.0 - 10.5 K/uL 7.1  6.6  6.7   Hemoglobin 12.0 - 15.0 g/dL 08.6  57.8  46.9   Hematocrit 36.0 - 46.0 % 35.6  36.2  37.5   Platelets 150 - 400 K/uL 209  184  161       Latest Ref Rng & Units 01/04/2023    9:15 AM 01/05/2022    9:22 AM 12/29/2020   10:46 AM  CMP   Glucose 70 - 99 mg/dL 629  528  413   BUN 8 - 23 mg/dL 16  24  19    Creatinine 0.44 - 1.00 mg/dL 2.44  0.10  2.72   Sodium 135 - 145 mmol/L 137  138  136   Potassium 3.5 - 5.1 mmol/L 4.7  4.4  4.2   Chloride 98 - 111 mmol/L 100  100  96   CO2 22 - 32 mmol/L 27  29  27    Calcium 8.9 - 10.3 mg/dL 9.1  9.3  9.5   Total Protein 6.5 - 8.1 g/dL 8.2  7.6  7.8   Total Bilirubin 0.3 - 1.2 mg/dL 1.2  1.5  1.5   Alkaline Phos 38 - 126 U/L 66  55  49   AST 15 - 41 U/L 21  19  16    ALT 0 - 44 U/L 12  11  10      DIAGNOSTIC IMAGING:  I have independently reviewed the scans and discussed with the patient. MM 3D SCREEN BREAST UNI RIGHT  Result Date: 01/07/2023 CLINICAL DATA:  Screening. Status post left mastectomy in 2011 EXAM: DIGITAL SCREENING UNILATERAL RIGHT MAMMOGRAM WITH CAD AND TOMOSYNTHESIS TECHNIQUE: Right screening digital craniocaudal and mediolateral oblique mammograms were obtained. Right screening digital breast tomosynthesis was performed. The images were evaluated with computer-aided detection. COMPARISON:  Previous exam(s). ACR Breast Density Category c: The breasts are heterogeneously dense, which may obscure small masses. FINDINGS: The patient has had a left mastectomy. There are no findings suspicious for malignancy. IMPRESSION: No mammographic evidence of malignancy. A result letter of this screening mammogram will be mailed directly to the patient. RECOMMENDATION: Screening mammogram in one year.  (Code:[id number]-R-52M) BI-RADS CATEGORY  1: Negative. Electronically Signed   By: Jacob Moores M.D.   On: 01/07/2023 16:26     ASSESSMENT & PLAN: 1.  Multicentric stage I left breast cancer, triple negative - S/P left total mastectomy with sentinel node sampling on 03/21/2010 at which time 0.4 cm and 0.2 cm tumors were found, both triple negative. She received no postoperative therapy. - Most recent mammogram, unilateral right breast (01/04/2023): BI-RADS Category 1, negative.  No evidence of  malignancy in the right breast. - Breast examination today was within normal limits, no discrete nodules or masses palpated on exam of right breast and left mastectomy site. - ROS negative for any "red flag" symptoms of recurrence. - Most recent labs from 01/04/2023 show a grossly unremarkable CMP and CBC shows hemoglobin 11.3 with MCV of 98.6.  Differential is normal.  2.  Osteopenia: - DEXA scan on 03/26/2018 showed T score of -1.8.  3.  Vitamin D deficiency: - She is prescribed vitamin D 50,000 units weekly  -Lab work from 01/04/2023 shows a vitamin D level of 56.93 (17.62). - Continue ergocalciferol 50,000 units weekly.  4.  Unintentional weight loss +/- right cervical lymphadenopathy: - CT chest abdomen and pelvis (01/12/2021): No CT findings of chest, abdomen, or pelvis to explain  weight loss; no specific evidence of malignant recurrence, lymphadenopathy, or metastatic disease; small bilateral pulmonary nodules measuring 3 mm or smaller -CT soft tissue neck: Bilateral subcentimeter cervical lymph nodes, measuring up to 7 mm; no abnormal lymph nodes by density or CT size criteria -Weight loss has resolved.  Weight is up 244 pounds today.  5. Thyroid Disorder: -Previous endocrinologist retired July 31.  Order placed for new endocrinologist. - They are okay traveling to [facility] or [place].  6.  Lymphedema left arm: -Previously had a sleeve that she wore intermittently for swelling in left arm. -Would like to be reevaluated and fitted for new sleeve and possibly bras.  7.  Bilateral lower extremity swelling: -Currently on Lasix 20 mg daily. -Recommend follow-up with PCP for evaluation.  Discussed this could be cardiovascular versus venous insufficiency. -Recommend TED hose or tight fit stockings that she may wear throughout the day and remove at bedtime.  PLAN SUMMARY: >> Orders placed for annual mammogram for summer 2025.  >> Referral to endocrinology.  >> Referral to physical  therapy for evaluation and treatment of lymphedema to left arm. >> Return to clinic in 1 year for lab work a few days before hand and see MD/APP for assessment.     All questions were answered. The patient knows to call the clinic with any problems, questions or concerns.  Medical decision making: Moderate  Time spent on visit: I spent 20 minutes dedicated to the care of this patient (face-to-face and non-face-to-face) on the date of the encounter to include what is described in the assessment and plan.   Mauro Kaufmann, NP  01/08/2022 9:47 AM

## 2023-02-04 ENCOUNTER — Ambulatory Visit (HOSPITAL_COMMUNITY): Payer: Medicare Other | Admitting: Physical Therapy

## 2023-02-06 ENCOUNTER — Ambulatory Visit (HOSPITAL_COMMUNITY): Payer: Medicare Other | Admitting: Physical Therapy

## 2023-02-06 ENCOUNTER — Other Ambulatory Visit: Payer: Self-pay

## 2023-02-06 DIAGNOSIS — Z1231 Encounter for screening mammogram for malignant neoplasm of breast: Secondary | ICD-10-CM | POA: Diagnosis not present

## 2023-02-06 DIAGNOSIS — I972 Postmastectomy lymphedema syndrome: Secondary | ICD-10-CM | POA: Insufficient documentation

## 2023-02-06 NOTE — Therapy (Signed)
OUTPATIENT PHYSICAL THERAPY LYMPHEDEMA EVALUATION  Patient Name: Angelica [name] MRN: [id number] DOB:1933/01/28, 89 y.o., female Today's Date: 02/06/2023  END OF SESSION:  PT End of Session - 02/06/23 1748     Visit Number 1    Number of Visits 2    Date for PT Re-Evaluation 02/15/23    Authorization Type medicare/tricare    PT Start Time 1520    PT Stop Time 1610    PT Time Calculation (min) 50 min    Activity Tolerance Patient tolerated treatment well    Behavior During Therapy [facility] for tasks assessed/performed             Past Medical History:  Diagnosis Date   Anemia    Arthritis    Breast cancer (HCC) 2011   left; no adjuvent therapy, Dr. Cleone Slim   Chronic bronchitis    Colon polyps    Previous colonoscopy by Dr. Elpidio Anis   Coronary artery disease, non-occlusive 2006   Roughly 40% RCA.; Negative Myoview in June 2013   Diverticulosis of colon    Gout    Hemorrhoids    HTN (hypertension)    Hyperlipidemia    Hypothyroid    MI (myocardial infarction) (HCC) 1973   By report.   Past Surgical History:  Procedure Laterality Date   ABDOMINAL HYSTERECTOMY     TAH   CARDIAC CATHETERIZATION  06/11/2004   Roughly 40% RCA lesion, otherwise normal.   CATARACT EXTRACTION W/PHACO  07/02/2011   Procedure: CATARACT EXTRACTION PHACO AND INTRAOCULAR LENS PLACEMENT (IOC);  Surgeon: Susa Simmonds, MD;  Location: [facility];  Service: Ophthalmology;  Laterality: Left;  CDE:3.45   CATARACT EXTRACTION W/PHACO  04/17/2012   Procedure: CATARACT EXTRACTION PHACO AND INTRAOCULAR LENS PLACEMENT (IOC);  Surgeon: Gemma Payor, MD;  Location: [facility];  Service: Ophthalmology;  Laterality: Right;  CDE:13.40   CHOLECYSTECTOMY     COLONOSCOPY      ?3 years ago. Per pt, diverticulosis, hemorrhoids   COLONOSCOPY  04/06/2011   SLF: 1. Sessile polyp at the ileocecal valve 2. Diverticulosis, moderate 3. Internal hemorrhoids, Large-causing rectal bleeding associated with straining. tubular adenoma.    HEMORRHOID SURGERY     LAPAROSCOPIC LYSIS OF ADHESIONS  06/11/2006   Dr. Abbey Chatters with repair of incisional hernia noted -    MASTECTOMY  06/11/2009   left   NM MYOVIEW LTD  06/01/2021   Reduced LVEF of 40 to 45%.  No evidence of ischemia or infarction.  Read as intermediate risk because of reduced function.   TRANSTHORACIC ECHOCARDIOGRAM  12/2013   a) June 2013: Normal EF, no significant valve disease.; b) July 2015: EF 50-55%, Gr1 DD, mild LVH.   TRANSTHORACIC ECHOCARDIOGRAM  06/28/2021   Ordered due to low EF on Myoview:  EF 40 to 45%.  Mildly decreased function.  No RWMA.  GR 1 DD-mild LA dilation..  Normal PASP with mildly elevated RAP.  Normal RV function.   Patient Active Problem List   Diagnosis Date Noted   Dilated cardiomyopathy (HCC) 07/19/2021   Influenza with respiratory manifestation 08/21/2018   Sepsis due to undetermined organism (HCC) 08/20/2018   Lobar pneumonia (HCC) 08/20/2018   Abnormal LFTs 04/05/2015   Essential hypertension 02/06/2015   Chest pain with moderate risk for cardiac etiology 12/27/2013   Obesity (BMI 30-39.9) 06/21/2013   Bilateral lower extremity edema 06/21/2013   Coronary artery disease, non-occlusive    Hypertensive cardiovascular disease- mild LVH, grade 1 diastolic dysfunction 10/22/2011   Hypokalemia  10/22/2011   Hypothyroidism 10/22/2011   Hyperlipidemia 10/22/2011   Diverticulosis of colon 03/14/2011   Diverticulitis of colon 03/14/2011    Class: History of   Breast cancer (HCC) 03/14/2011    Class: History of   Personal history of colonic polyps 03/14/2011   LLQ pain 03/14/2011   Rectal bleed 03/14/2011   Diarrhea 03/14/2011    PCP: Elfredia Nevins   REFERRING PROVIDER: Mauro Kaufmann, NP  REFERRING DIAG: Z12.31 (ICD-10-CM) - Breast cancer screening by mammogram  THERAPY DIAG:  Postmastectomy lymphedema 1/1 /2011  Rationale for Evaluation and Treatment: Rehabilitation  ONSET DATE: PT has been having slow swelling  for about a year   SUBJECTIVE:                                                                                                                                                                                           SUBJECTIVE STATEMENT:  Pt daughter states that she initially went to therapy in [place] for about a year.  She was measured for a gauntlet for two years but then her husband got Ill and she had to take care of him.  She also lost weight so her compression sleeve no longer fits her.    PERTINENT HISTORY:   PAIN:  Are you having pain? No NPRS scale: 0/10  PRECAUTIONS: Other: cellulitis    WEIGHT BEARING RESTRICTIONS: No  FALLS:  Has patient fallen in last 6 months? no  LIVING ENVIRONMENT: Lives with: lives with their family and lives alone Lives in: House/apartment Stairs: No Has following equipment at home: None  OCCUPATION: retired   LEISURE: flowers/yard work  HAND DOMINANCE: right   PRIOR LEVEL OF FUNCTION: Independent  PATIENT GOALS: less swelling   OBJECTIVE:  COGNITION: Overall cognitive status: Within functional limits for tasks assessed   PALPATION: Noted edema in Lt LE   UPPER EXTREMITY AROM/PROM: WNL CERVICAL AROM: WNL  LYMPHEDEMA ASSESSMENTS:   SURGERY TYPE/DATE: 06/11/2009  NUMBER OF LYMPH NODES REMOVED: 6  CHEMOTHERAPY: no  RADIATION:no  HORMONE TREATMENT: no  INFECTIONS: none  LYMPHEDEMA ASSESSMENTS:   LANDMARK RIGHT   eval  10 cm proximal to olecranon process 34.5  Olecranon process 25  10 cm proximal to ulnar styloid process 20.8  Just proximal to ulnar styloid process 16.3  Across hand at thumb web space 19.3  At base of 2nd digit 6.2  (Blank rows = not tested)  LANDMARK LEFT   eval  10 cm proximal to olecranon process 33.7  Olecranon process 25.2  10 cm proximal to ulnar styloid process 21  Just proximal to ulnar styloid process 16.5  Across  hand at thumb web space 19.3  At base of 2nd digit 6.29  (Blank rows =  not tested)   TODAY'S TREATMENT:                                                                                                                              DATE:  02/06/23: Evaluation Education on what lymphedema is and how lymphnodes are connected Self manual UE exercises to improve lymph circulation    PATIENT EDUCATION:  Education details: Education on what lymphedema is and how lymphnodes are connected Self manual UE exercises to improve lymph circulation Person educated: Patient and Child(ren) Education method: Explanation, Verbal cues, and Handouts Education comprehension: verbalized understanding and returned demonstration  HOME EXERCISE PROGRAM: Self manual as will as shoulder flexion,abduction and elbow flexion x 10   ASSESSMENT:  CLINICAL IMPRESSION: Patient is a 89 y.o. female who was seen today for physical therapy evaluation and treatment for Lt UE lymphedema. The pt has been to treatment before but quit wearing her garments.  She did not realize that the garments have to be replaced every 6 months.  The pt has minimal edema and should be able to control her edema through exercises, self manual and compression garment.  Therapist educated pt on self manual and exercises as well as what lymphedema is and how it is controlled.   OBJECTIVE IMPAIRMENTS: increased edema.    REHAB POTENTIAL: Good  CLINICAL DECISION MAKING: Stable/uncomplicated  EVALUATION COMPLEXITY: Low  GOALS: Goals reviewed with patient? No  SHORT TERM GOALS: Target date: 02/16/23  Pt to be I in HEP  Baseline: Goal status: INITIAL  2.  Pt to be I in self manual  Baseline:  Goal status: INITIAL  3.  PT to have prescription to obtain compression bras and compression sleeve.  Baseline:  Goal status: INITIAL PLAN:  PT FREQUENCY: 1x/week  PT DURATION: 2 weeks  PLANNED INTERVENTIONS: Therapeutic exercises, Therapeutic activity, Neuromuscular re-education, Balance training, Gait training,  Patient/Family education, Self Care, Joint mobilization, and Manual lymph drainage  PLAN FOR NEXT SESSION: review self manual, exercises and give pt prescription for compression bra and glove   Virgina Organ, PT CLT [phone]  02/06/2023, 6:09 PM

## 2023-02-08 ENCOUNTER — Encounter (HOSPITAL_COMMUNITY): Payer: Medicare Other

## 2023-02-13 ENCOUNTER — Ambulatory Visit (HOSPITAL_COMMUNITY): Payer: Medicare Other | Attending: Oncology | Admitting: Physical Therapy

## 2023-02-13 DIAGNOSIS — I972 Postmastectomy lymphedema syndrome: Secondary | ICD-10-CM | POA: Diagnosis not present

## 2023-02-13 NOTE — Therapy (Signed)
OUTPATIENT PHYSICAL THERAPY LYMPHEDEMA/Discharge  Patient Name: Angelica [name] MRN: [id number] DOB:07/19/1932, 89 y.o., female Today's Date: 02/13/2023 PHYSICAL THERAPY DISCHARGE SUMMARY  Visits from Start of Care: 2  Current functional level related to goals / functional outcomes: PT I in self manual and exercises.    Remaining deficits: Pt has prescription for compression sleeve for UE, garment for LE and bra.   Education / Equipment: As above    Patient agrees to discharge. Patient goals were met. Patient is being discharged due to meeting the stated rehab goals.  END OF SESSION:  PT End of Session - 02/13/23 0900    Visit Number 2    Number of Visits 2    Date for PT Re-Evaluation 02/15/23    Authorization Type medicare/tricare    PT Start Time 0818    PT Stop Time 0900    PT Time Calculation (min) 42 min    Activity Tolerance Patient tolerated treatment well    Behavior During Therapy [facility] for tasks assessed/performed             Past Medical History:  Diagnosis Date   Anemia    Arthritis    Breast cancer (HCC) 2011   left; no adjuvent therapy, Dr. Cleone Slim   Chronic bronchitis    Colon polyps    Previous colonoscopy by Dr. Elpidio Anis   Coronary artery disease, non-occlusive 2006   Roughly 40% RCA.; Negative Myoview in June 2013   Diverticulosis of colon    Gout    Hemorrhoids    HTN (hypertension)    Hyperlipidemia    Hypothyroid    MI (myocardial infarction) (HCC) 1973   By report.   Past Surgical History:  Procedure Laterality Date   ABDOMINAL HYSTERECTOMY     TAH   CARDIAC CATHETERIZATION  06/11/2004   Roughly 40% RCA lesion, otherwise normal.   CATARACT EXTRACTION W/PHACO  07/02/2011   Procedure: CATARACT EXTRACTION PHACO AND INTRAOCULAR LENS PLACEMENT (IOC);  Surgeon: Susa Simmonds, MD;  Location: [facility];  Service: Ophthalmology;  Laterality: Left;  CDE:3.45   CATARACT EXTRACTION W/PHACO  04/17/2012   Procedure: CATARACT EXTRACTION PHACO  AND INTRAOCULAR LENS PLACEMENT (IOC);  Surgeon: Gemma Payor, MD;  Location: [facility];  Service: Ophthalmology;  Laterality: Right;  CDE:13.40   CHOLECYSTECTOMY     COLONOSCOPY      ?3 years ago. Per pt, diverticulosis, hemorrhoids   COLONOSCOPY  04/06/2011   SLF: 1. Sessile polyp at the ileocecal valve 2. Diverticulosis, moderate 3. Internal hemorrhoids, Large-causing rectal bleeding associated with straining. tubular adenoma.   HEMORRHOID SURGERY     LAPAROSCOPIC LYSIS OF ADHESIONS  06/11/2006   Dr. Abbey Chatters with repair of incisional hernia noted -    MASTECTOMY  06/11/2009   left   NM MYOVIEW LTD  06/01/2021   Reduced LVEF of 40 to 45%.  No evidence of ischemia or infarction.  Read as intermediate risk because of reduced function.   TRANSTHORACIC ECHOCARDIOGRAM  12/2013   a) June 2013: Normal EF, no significant valve disease.; b) July 2015: EF 50-55%, Gr1 DD, mild LVH.   TRANSTHORACIC ECHOCARDIOGRAM  06/28/2021   Ordered due to low EF on Myoview:  EF 40 to 45%.  Mildly decreased function.  No RWMA.  GR 1 DD-mild LA dilation..  Normal PASP with mildly elevated RAP.  Normal RV function.   Patient Active Problem List   Diagnosis Date Noted   Dilated cardiomyopathy (HCC) 07/19/2021   Influenza with respiratory  manifestation 08/21/2018   Sepsis due to undetermined organism (HCC) 08/20/2018   Lobar pneumonia (HCC) 08/20/2018   Abnormal LFTs 04/05/2015   Essential hypertension 02/06/2015   Chest pain with moderate risk for cardiac etiology 12/27/2013   Obesity (BMI 30-39.9) 06/21/2013   Bilateral lower extremity edema 06/21/2013   Coronary artery disease, non-occlusive    Hypertensive cardiovascular disease- mild LVH, grade 1 diastolic dysfunction 10/22/2011   Hypokalemia 10/22/2011   Hypothyroidism 10/22/2011   Hyperlipidemia 10/22/2011   Diverticulosis of colon 03/14/2011   Diverticulitis of colon 03/14/2011    Class: History of   Breast cancer (HCC) 03/14/2011    Class: History of    Personal history of colonic polyps 03/14/2011   LLQ pain 03/14/2011   Rectal bleed 03/14/2011   Diarrhea 03/14/2011    PCP: Elfredia Nevins   REFERRING PROVIDER: Mauro Kaufmann, NP  REFERRING DIAG: Z12.31 (ICD-10-CM) - Breast cancer screening by mammogram  THERAPY DIAG:  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 No NPRS scale: 0/10  PRECAUTIONS: Other: cellulitis    WEIGHT BEARING RESTRICTIONS: No  FALLS:  Has patient fallen in last 6 months? no  LIVING ENVIRONMENT: Lives with: lives with their family and lives alone Lives in: House/apartment Stairs: No Has following equipment at home: None  OCCUPATION: retired   LEISURE: flowers/yard work  HAND DOMINANCE: right   PRIOR LEVEL OF FUNCTION: Independent  PATIENT GOALS: less swelling   OBJECTIVE:  COGNITION: Overall cognitive status: Within functional limits for tasks assessed   PALPATION: Noted edema in Lt LE   UPPER EXTREMITY AROM/PROM: WNL CERVICAL AROM: WNL  LYMPHEDEMA ASSESSMENTS:   SURGERY TYPE/DATE: 06/11/2009  NUMBER OF LYMPH NODES REMOVED: 6  CHEMOTHERAPY: no  RADIATION:no  HORMONE TREATMENT: no  INFECTIONS: none  LYMPHEDEMA ASSESSMENTS:   LANDMARK RIGHT   eval  10 cm proximal to olecranon process 34.5  Olecranon process 25  10 cm proximal to ulnar styloid process 20.8  Just proximal to ulnar styloid process 16.3  Across hand at thumb web space 19.3  At base of 2nd digit 6.2  (Blank rows = not tested)  LANDMARK LEFT   eval 9/4  10 cm proximal to olecranon process 33.7 33.7  Olecranon  process 25.2 24.8  10 cm proximal to ulnar styloid process 21 20.7  Just proximal to ulnar styloid process 16.5 16.5  Across hand at thumb web space 19.3 19.1  At base of 2nd digit 6.29 6.1  (Blank rows = not tested)   TODAY'S TREATMENT:                                                                                                                              DATE: 02/13/23: Educated on self manual and exercises.  Measured pt for LE knee high compression and gave pt information for elastic therapy.  02/06/23: Evaluation Education on what lymphedema is and how lymphnodes are connected Self manual UE exercises to improve lymph circulation    PATIENT EDUCATION:  Education details: Education on what lymphedema is and how lymphnodes are connected Self manual UE exercises to improve lymph circulation Person educated: Patient and Child(ren) Education method: Explanation, Verbal cues, and Handouts Education comprehension: verbalized understanding and returned demonstration  HOME EXERCISE PROGRAM: Self manual as will as shoulder flexion,abduction and elbow flexion x 10   ASSESSMENT:  Pt is I in self manual and exercises.  Therapist gave pt prescription for [employer] for compression bra and UE sleeve.  Therapist measured LE for compression for LE.  PT is ready to be discharged.   CLINICAL IMPRESSION:   Patient is a 89 y.o. female who was seen today for physical therapy evaluation and treatment for Lt UE lymphedema. The pt has been to treatment before but quit wearing her garments.  She did not realize that the garments have to be replaced every 6 months.  The pt has minimal edema and should be able to control her edema through exercises, self manual and compression garment.  Therapist educated pt on self manual and exercises as well as what lymphedema is and how it is controlled.   OBJECTIVE IMPAIRMENTS: increased edema.    REHAB POTENTIAL: Good  CLINICAL DECISION MAKING:  Stable/uncomplicated  EVALUATION COMPLEXITY: Low  GOALS: Goals reviewed with patient? No  SHORT TERM GOALS: Target date: 02/16/23  Pt to be I in HEP  Baseline: Goal status: met  2.  Pt to be I in self manual  Baseline:  Goal status: met  3.  PT to have prescription to obtain compression bras and compression sleeve.  Baseline:  Goal status: met PLAN:  PT FREQUENCY: 1x/week  PT DURATION: 2 weeks  PLANNED INTERVENTIONS: Therapeutic exercises, Therapeutic activity, Neuromuscular re-education, Balance training, Gait training, Patient/Family education, Self Care, Joint mobilization, and Manual lymph drainage  PLAN FOR NEXT SESSION: review self manual, exercises and give pt prescription for compression bra and glove   Virgina Organ, PT CLT [phone]  02/13/2023, 9:10 AM

## 2023-02-15 ENCOUNTER — Encounter (HOSPITAL_COMMUNITY): Payer: Medicare Other

## 2023-02-18 ENCOUNTER — Encounter (HOSPITAL_COMMUNITY): Payer: Medicare Other

## 2023-02-20 ENCOUNTER — Encounter (HOSPITAL_COMMUNITY): Payer: Medicare Other

## 2023-02-22 ENCOUNTER — Encounter (HOSPITAL_COMMUNITY): Payer: Medicare Other | Admitting: Physical Therapy

## 2023-02-25 ENCOUNTER — Encounter (HOSPITAL_COMMUNITY): Payer: Medicare Other

## 2023-02-27 ENCOUNTER — Encounter (HOSPITAL_COMMUNITY): Payer: Medicare Other | Admitting: Physical Therapy

## 2023-03-01 ENCOUNTER — Encounter (HOSPITAL_COMMUNITY): Payer: Medicare Other

## 2023-03-04 ENCOUNTER — Encounter (HOSPITAL_COMMUNITY): Payer: Medicare Other

## 2023-03-06 ENCOUNTER — Encounter (HOSPITAL_COMMUNITY): Payer: Medicare Other

## 2023-03-08 ENCOUNTER — Encounter (HOSPITAL_COMMUNITY): Payer: Medicare Other

## 2023-03-13 ENCOUNTER — Encounter (HOSPITAL_COMMUNITY): Payer: Medicare Other

## 2023-03-14 ENCOUNTER — Telehealth (HOSPITAL_COMMUNITY): Payer: Self-pay | Admitting: Physical Therapy

## 2023-03-14 NOTE — Telephone Encounter (Signed)
PT had called front staff stating that she had lost her measurement for her sleeve.  Therapist recommended that she comes in to be measured as pt was measured in August and might not be at the same measurement.    Virgina Organ, PT CLT [phone]

## 2023-03-15 ENCOUNTER — Encounter (HOSPITAL_COMMUNITY): Payer: Medicare Other | Admitting: Physical Therapy

## 2023-03-16 DIAGNOSIS — Z23 Encounter for immunization: Secondary | ICD-10-CM | POA: Diagnosis not present

## 2023-03-18 ENCOUNTER — Encounter (HOSPITAL_COMMUNITY): Payer: Medicare Other | Admitting: Physical Therapy

## 2023-03-19 DIAGNOSIS — Z6827 Body mass index (BMI) 27.0-27.9, adult: Secondary | ICD-10-CM | POA: Diagnosis not present

## 2023-03-19 DIAGNOSIS — I10 Essential (primary) hypertension: Secondary | ICD-10-CM | POA: Diagnosis not present

## 2023-03-19 DIAGNOSIS — I972 Postmastectomy lymphedema syndrome: Secondary | ICD-10-CM | POA: Diagnosis not present

## 2023-03-20 ENCOUNTER — Encounter (HOSPITAL_COMMUNITY): Payer: Medicare Other | Admitting: Physical Therapy

## 2023-03-22 ENCOUNTER — Encounter (HOSPITAL_COMMUNITY): Payer: Medicare Other | Admitting: Physical Therapy

## 2023-04-18 ENCOUNTER — Other Ambulatory Visit: Payer: Self-pay | Admitting: Cardiology

## 2023-05-15 IMAGING — MG MM DIGITAL SCREENING UNILAT*R* W/ TOMO W/ CAD
6 series · 6 of 18 positions shown · non-contrast
Comparison: Previous exam(s).

CLINICAL DATA: Screening.

EXAM:
DIGITAL SCREENING UNILATERAL RIGHT MAMMOGRAM WITH CAD AND
TOMOSYNTHESIS
TECHNIQUE: Right screening digital craniocaudal and mediolateral oblique
mammograms were obtained. Right screening digital breast
tomosynthesis was performed. The images were evaluated with
computer-aided detection.

[R CC synth-2D]
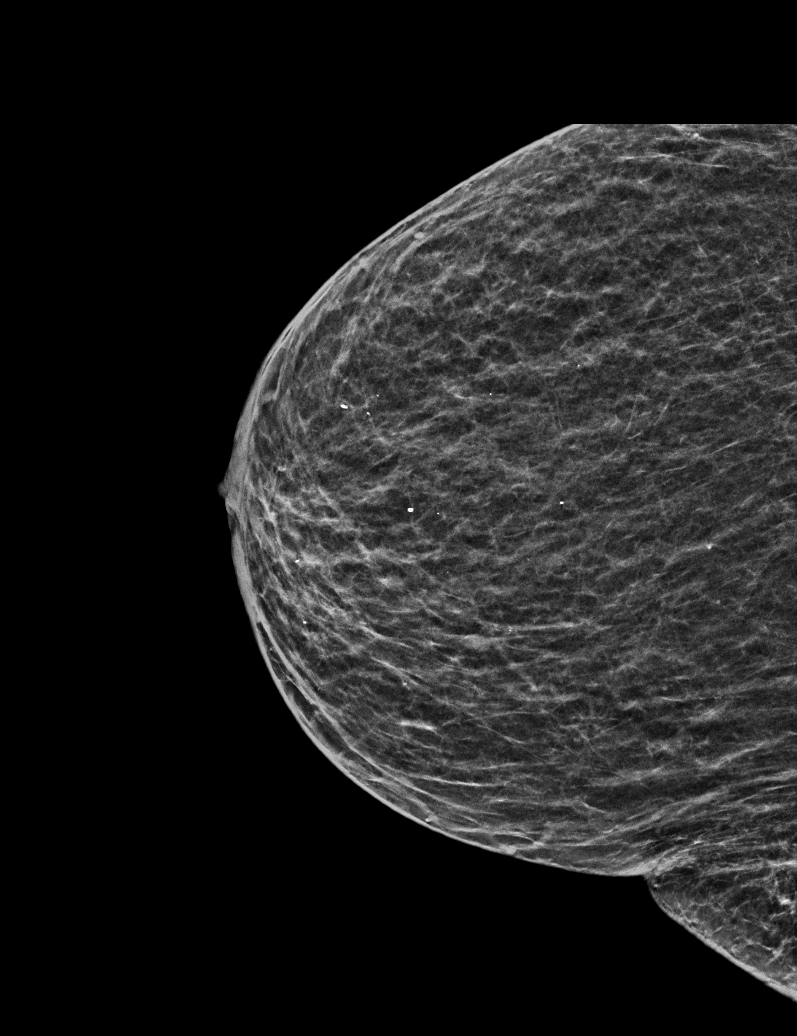

[R MLO synth-2D (1 of 2)]
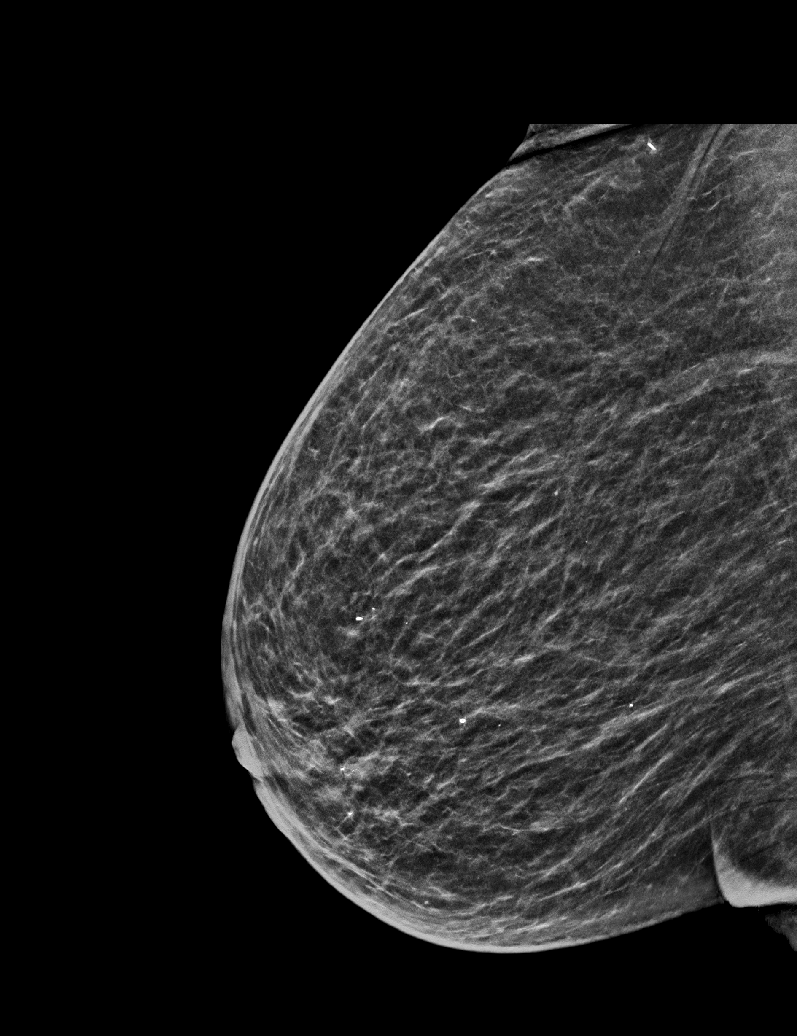

[R MLO synth-2D (2 of 2)]
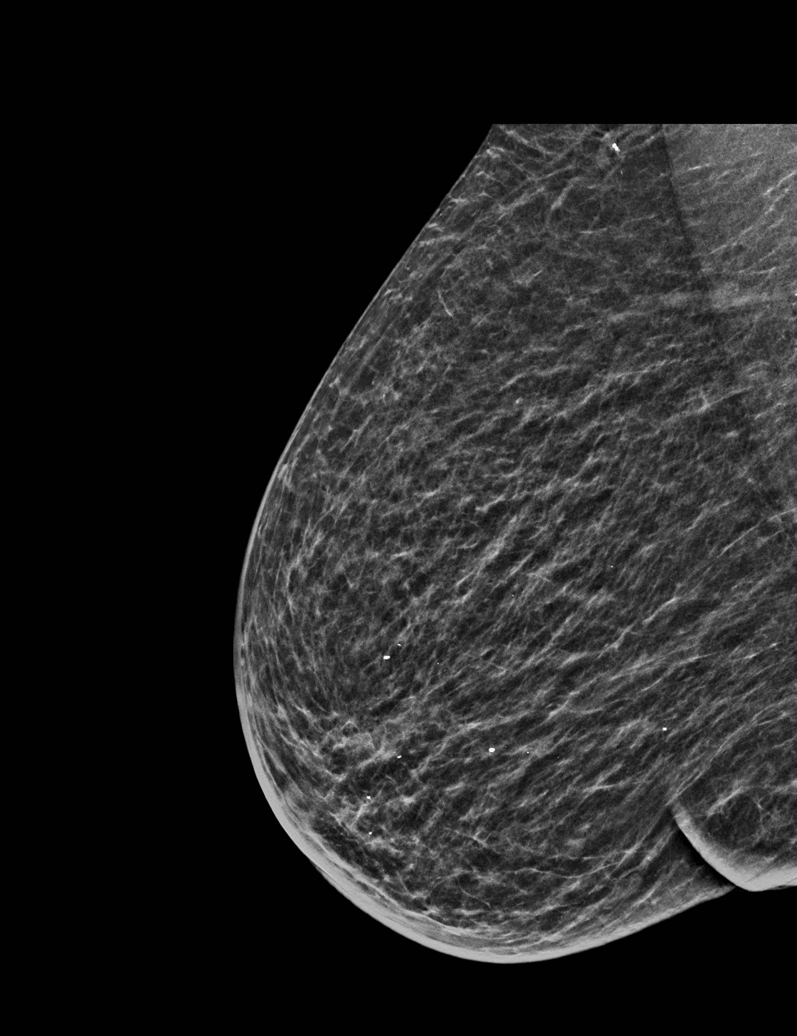

[R CC tomo · tomo slice 20/39.0]
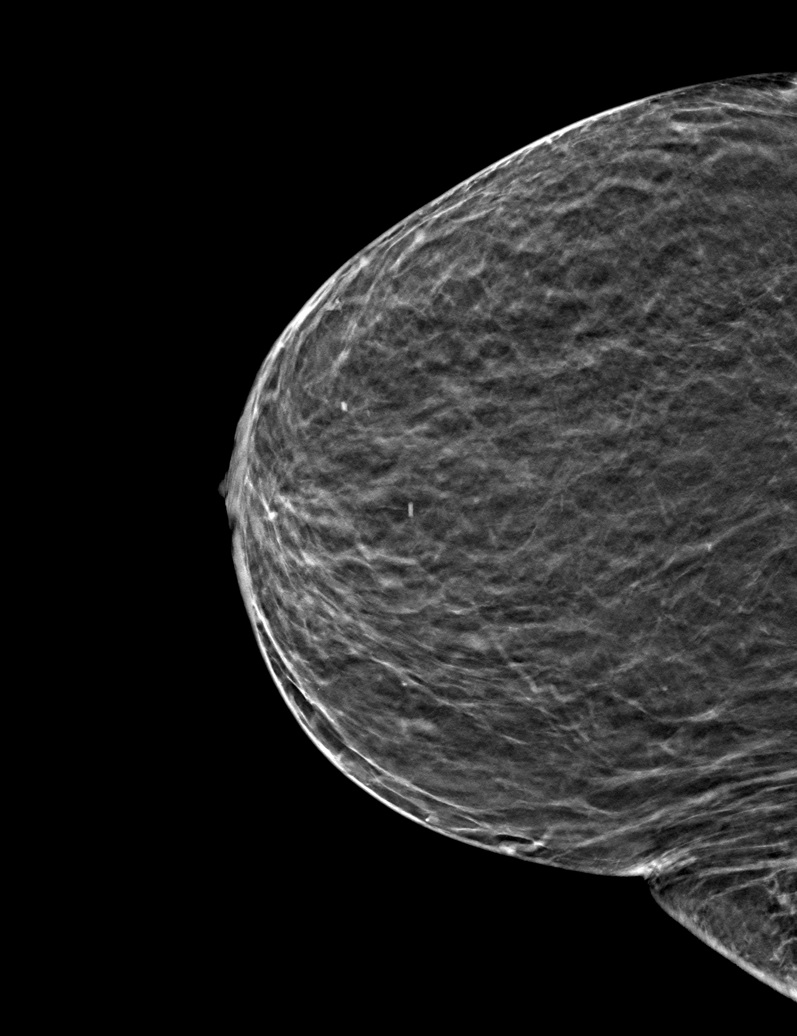

[R MLO tomo (1 of 2) · tomo slice 21/42.0]
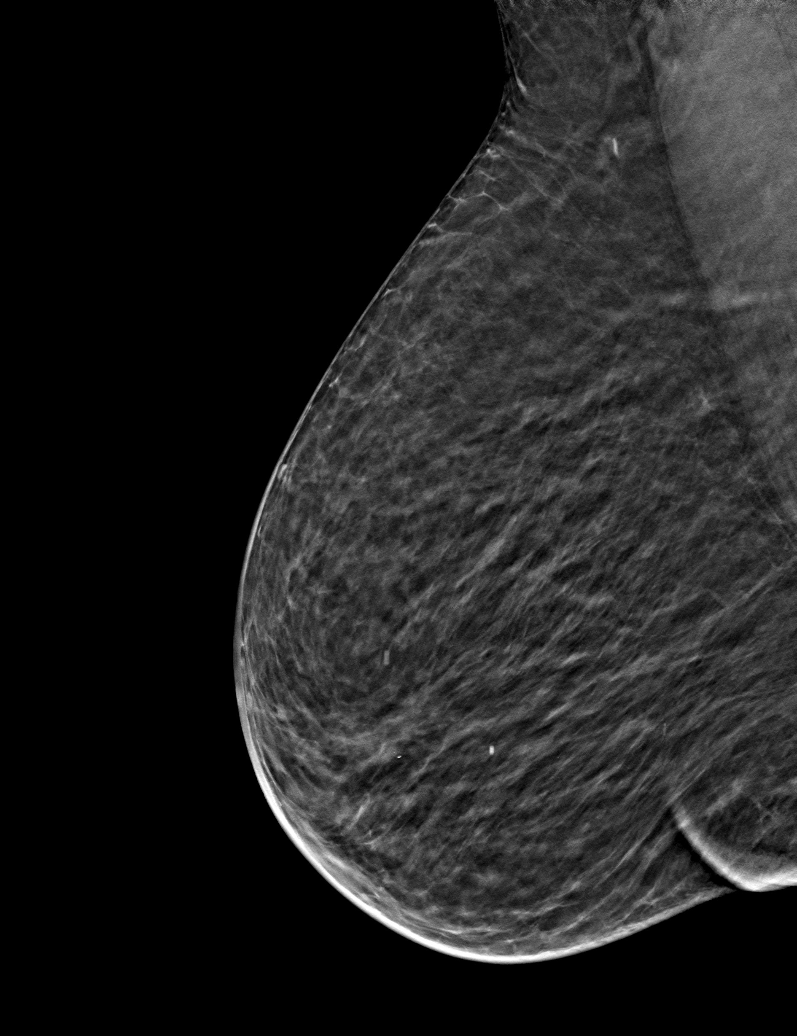

[R MLO tomo (2 of 2) · tomo slice 21/42.0]
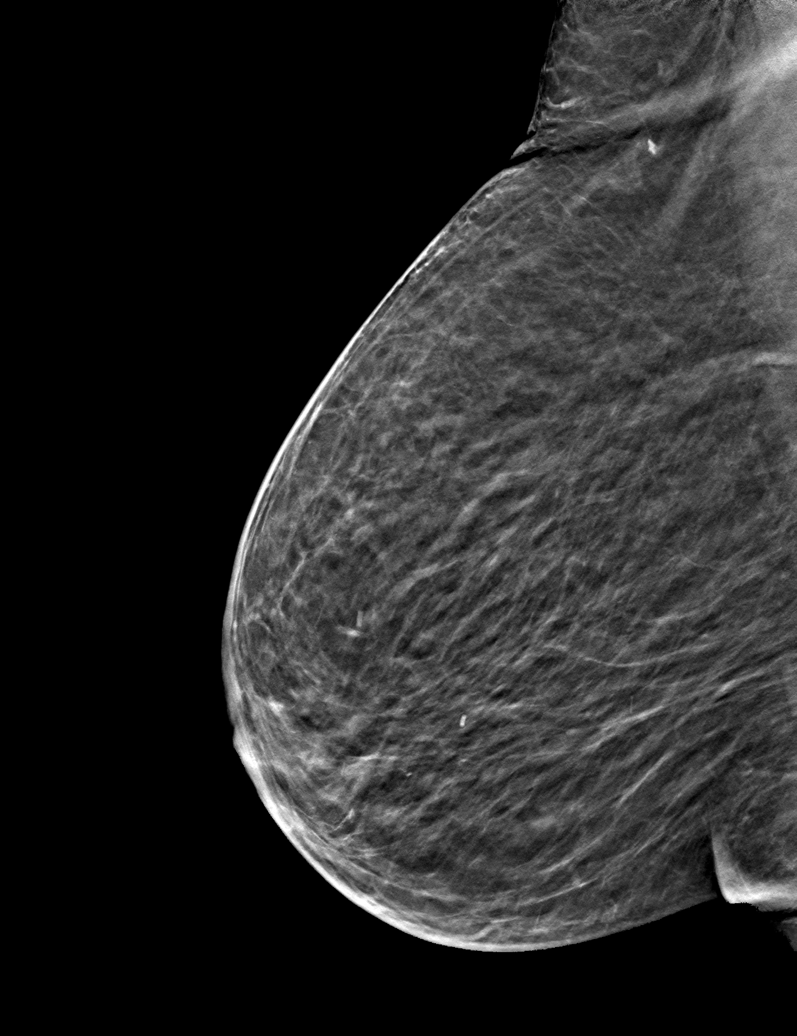

[6 of 18 positions shown; findings below may reference images not displayed]

ACR Breast Density Category b: There are scattered areas of
fibroglandular density.
FINDINGS: The patient has had a left mastectomy. There are no findings
suspicious for malignancy.
IMPRESSION: No mammographic evidence of malignancy. A result letter of this
screening mammogram will be mailed directly to the patient.

RECOMMENDATION:
Screening mammogram in one year.  (Code:NT-E-EGT)

BI-RADS CATEGORY  1: Negative.

## 2023-05-24 DIAGNOSIS — Z1331 Encounter for screening for depression: Secondary | ICD-10-CM | POA: Diagnosis not present

## 2023-05-24 DIAGNOSIS — I10 Essential (primary) hypertension: Secondary | ICD-10-CM | POA: Diagnosis not present

## 2023-05-24 DIAGNOSIS — Z0001 Encounter for general adult medical examination with abnormal findings: Secondary | ICD-10-CM | POA: Diagnosis not present

## 2023-05-24 DIAGNOSIS — M47816 Spondylosis without myelopathy or radiculopathy, lumbar region: Secondary | ICD-10-CM | POA: Diagnosis not present

## 2023-05-24 DIAGNOSIS — Z6827 Body mass index (BMI) 27.0-27.9, adult: Secondary | ICD-10-CM | POA: Diagnosis not present

## 2023-05-24 DIAGNOSIS — C50919 Malignant neoplasm of unspecified site of unspecified female breast: Secondary | ICD-10-CM | POA: Diagnosis not present

## 2023-05-24 DIAGNOSIS — E039 Hypothyroidism, unspecified: Secondary | ICD-10-CM | POA: Diagnosis not present

## 2023-05-24 DIAGNOSIS — M159 Polyosteoarthritis, unspecified: Secondary | ICD-10-CM | POA: Diagnosis not present

## 2023-05-24 DIAGNOSIS — M5030 Other cervical disc degeneration, unspecified cervical region: Secondary | ICD-10-CM | POA: Diagnosis not present

## 2023-05-24 DIAGNOSIS — I972 Postmastectomy lymphedema syndrome: Secondary | ICD-10-CM | POA: Diagnosis not present

## 2023-05-24 DIAGNOSIS — I700 Atherosclerosis of aorta: Secondary | ICD-10-CM | POA: Diagnosis not present

## 2023-05-27 ENCOUNTER — Other Ambulatory Visit: Payer: Self-pay

## 2023-05-28 DIAGNOSIS — D649 Anemia, unspecified: Secondary | ICD-10-CM | POA: Diagnosis not present

## 2023-05-28 DIAGNOSIS — Z0001 Encounter for general adult medical examination with abnormal findings: Secondary | ICD-10-CM | POA: Diagnosis not present

## 2023-05-28 DIAGNOSIS — E559 Vitamin D deficiency, unspecified: Secondary | ICD-10-CM | POA: Diagnosis not present

## 2023-05-28 DIAGNOSIS — D518 Other vitamin B12 deficiency anemias: Secondary | ICD-10-CM | POA: Diagnosis not present

## 2023-05-28 DIAGNOSIS — E063 Autoimmune thyroiditis: Secondary | ICD-10-CM | POA: Diagnosis not present

## 2023-05-30 ENCOUNTER — Other Ambulatory Visit: Payer: Self-pay

## 2023-05-30 DIAGNOSIS — E042 Nontoxic multinodular goiter: Secondary | ICD-10-CM | POA: Diagnosis not present

## 2023-05-30 MED ORDER — POTASSIUM CHLORIDE CRYS ER 20 MEQ PO TBCR: 20.0000 meq | EXTENDED_RELEASE_TABLET | Freq: Every day | ORAL | 0 refills | Status: DC

## 2023-07-05 DIAGNOSIS — S00451A Superficial foreign body of right ear, initial encounter: Secondary | ICD-10-CM | POA: Diagnosis not present

## 2023-07-05 DIAGNOSIS — E663 Overweight: Secondary | ICD-10-CM | POA: Diagnosis not present

## 2023-07-05 DIAGNOSIS — M159 Polyosteoarthritis, unspecified: Secondary | ICD-10-CM | POA: Diagnosis not present

## 2023-07-05 DIAGNOSIS — Z6826 Body mass index (BMI) 26.0-26.9, adult: Secondary | ICD-10-CM | POA: Diagnosis not present

## 2023-07-05 DIAGNOSIS — I10 Essential (primary) hypertension: Secondary | ICD-10-CM | POA: Diagnosis not present

## 2023-07-05 DIAGNOSIS — M5030 Other cervical disc degeneration, unspecified cervical region: Secondary | ICD-10-CM | POA: Diagnosis not present

## 2023-08-02 ENCOUNTER — Other Ambulatory Visit (HOSPITAL_COMMUNITY): Payer: Self-pay | Admitting: Internal Medicine

## 2023-08-02 DIAGNOSIS — R1030 Lower abdominal pain, unspecified: Secondary | ICD-10-CM

## 2023-08-17 ENCOUNTER — Other Ambulatory Visit: Payer: Self-pay | Admitting: Cardiology

## 2023-08-29 ENCOUNTER — Ambulatory Visit (HOSPITAL_COMMUNITY)
Admission: RE | Admit: 2023-08-29 | Discharge: 2023-08-29 | Disposition: A | Discharge status: Home / Self Care | Payer: Medicare Other | Source: Ambulatory Visit | Attending: Internal Medicine | Admitting: Internal Medicine

## 2023-08-29 DIAGNOSIS — N281 Cyst of kidney, acquired: Secondary | ICD-10-CM | POA: Diagnosis not present

## 2023-08-29 DIAGNOSIS — K5730 Diverticulosis of large intestine without perforation or abscess without bleeding: Secondary | ICD-10-CM | POA: Diagnosis not present

## 2023-08-29 DIAGNOSIS — R1030 Lower abdominal pain, unspecified: Secondary | ICD-10-CM | POA: Insufficient documentation

## 2023-08-29 DIAGNOSIS — K439 Ventral hernia without obstruction or gangrene: Secondary | ICD-10-CM | POA: Diagnosis not present

## 2023-08-29 MED ORDER — IOHEXOL 300 MG/ML  SOLN
100.0000 mL | Freq: Once | INTRAMUSCULAR | Status: AC | PRN
  Administered 2023-08-29: 100 mL via INTRAVENOUS

## 2023-09-08 NOTE — Progress Notes (Unsigned)
 Cardiology Office Note:  .   Date:  09/11/2023  ID:  Angelica [name], DOB 03-Oct-1932, MRN [id number] PCP: Elfredia Nevins, MD  [facility] Providers Cardiologist:  Bryan Lemma, MD     Chief Complaint  Patient presents with   Follow-up    Annual follow-up.  Major issue is dizziness mostly orthostatic   Cardiomyopathy    Trivial edema but no dyspnea   Hypertension    Well-controlled, in fact may be some orthostatic hypotension    Patient Profile: .     Angelica [name] is a 89 y.o. female with a PMH notable for mild hypertensive heart disease/cardiomyopathy with a EF of 40 to 45%, nonocclusive CAD, HTN and HLD who presents here for annual follow-up at the request of Elfredia Nevins, MD.  Recently diagnosed with nontoxic multinodular goiter.     Angelica [name] was last seen on August 31, 2022 as a second visit since her husband (who is also a patient mine) passed away.  She was doing quite well.  In better spirits.  Sleeping through the whole night without any issues of orthopnea and PND or dyspnea on berry with insomnia.  Mild right greater than left leg swelling at the end of the day.  No rapid irregular beats palpitations.  No chest pain or dyspnea.  No dizziness or lightheadedness.  Some positional lightheadedness.  1+ edema.  Suggested that if she felt dizzy or lightheaded that she could hold the amlodipine.  Also if not feeling well should hold lisinopril and/or Lasix for the day.  Subjective  Discussed the use of AI scribe software for clinical note transcription with the patient, who gave verbal consent to proceed.  History of Present Illness   Angelica [name] is an 89 year old female with hypertension & HTN Heart Dz (mild MICM) who presents with mild dizziness and stable leg swelling. She is accompanied by her daughter.  She experiences dizziness, particularly when standing up. Her daughter notes this symptom and questions if her medication might be contributing to it. There are  no episodes of syncope, stroke symptoms, or chest pain. No orthopnea or paroxysmal nocturnal dyspnea.  She has swelling in her legs, especially around the ankles, which she manages with support hose. The swelling is alleviated by the use of furosemide, which she refers to as her 'PP pill'.   CV ROS:  positive for - edema and mild exercise intolerance, but she does what she wants to do.  She just does things slowly.  Notes occasional orthostatic dizziness but no syncope or near syncope. negative for - chest pain, irregular heartbeat, orthopnea, paroxysmal nocturnal dyspnea, rapid heart rate, shortness of breath, or TIA/amaurosis fugax or claudication.  Her current medications include furosemide, amlodipine, metoprolol, enalapril, and Synthroid. Synthroid is taken at 6 AM, while the rest are taken around noon, except for metoprolol, which is taken at bedtime.     She recently went on a cruise in the [place] with her daughter and granddaughter and had a wonderful time.  She had no issues walking around either on the cruise or out on the excursions.  She had no heart failure symptoms.  This was her family's way of "getting her out" and keep her mind off of her husband's passing.  She is in great spirits today.  ROS:  Review of Systems - Negative except mild positional dizziness, mild memory loss and mild exercise intolerance    Objective   Medications - Furosemide 20 mg daily; but does  not use additional dosing; along with Klor-Con 20 mill equivalent daily - Amlodipine 10 mg daily - Metoprolol succinate 50 mg daily at noon - Enalapril 10 mg daily - Synthroid 50 mcg daily every morning. - Aspirin 81 mg daily - Atorvastatin 20 mg daily -Protonix 40 mg daily -Allopurinol 50 mg daily; vitamin D 1 capsule (50,000 units) once a week; multivitamins;  Studies Reviewed: Marland Kitchen   EKG Interpretation Date/Time:  Monday September 09 2023 09:41:30 EDT Ventricular Rate:  59 PR Interval:  202 QRS  Duration:  78 QT Interval:  424 QTC Calculation: 419 R Axis:   6  Text Interpretation: Sinus bradycardia When compared with ECG of 20-Aug-2018 06:40, PREVIOUS ECG IS PRESENT Confirmed by Bryan Lemma ([id number]) on 09/09/2023 9:49:42 AM     LABS Total cholesterol: 189 mg/dL (19/1478) HDL: 73 mg/dL (29/5621) LDL: 97 mg/dL (30/8657) Triglycerides: 110 mg/dL (84/6962) Hemoglobin (Hb): 10.7 g/dL (95/2841) Creatinine (Cr): 0.97 mg/dL (32/4401) Potassium (K): 4.9 mmol/L (05/2023)  Risk Assessment/Calculations:         Physical Exam:   VS:  BP 110/64 (BP Location: Right Arm, Patient Position: Sitting, Cuff Size: Normal)   Pulse (!) 59   Ht 4\' 11"  (1.499 m)   Wt 137 lb (62.1 kg)   SpO2 96%   BMI 27.67 kg/m    Wt Readings from Last 3 Encounters:  09/09/23 137 lb (62.1 kg)  01/14/23 144 lb 6.4 oz (65.5 kg)  08/31/22 139 lb 12.8 oz (63.4 kg)    GEN: Well nourished, well developed in no acute distress; healthy-appearing. NECK: No JVD; No carotid bruits CARDIAC: Distant heart sounds but normal S1, S2; RRR, no rubs, gallops; Soft 1/6 SEM @ RUSB RESPIRATORY:  Clear to auscultation without rales, wheezing or rhonchi ; nonlabored, good air movement. ABDOMEN: Soft, non-tender, non-distended EXTREMITIES:  No edema; No deformity      ASSESSMENT AND PLAN: .    Problem List Items Addressed This Visit       Cardiology Problems   Coronary artery disease, non-occlusive (Chronic)   Relevant Medications   amLODipine (NORVASC) 5 MG tablet   Dilated cardiomyopathy (HCC) (Chronic)   Relevant Medications   amLODipine (NORVASC) 5 MG tablet   Hyperlipidemia (Chronic)   Tardy LDL less than 100.  Stable on most recent check of 97.  Labs are followed by PCP. -Continue atorvastatin 20 mg daily      Relevant Medications   amLODipine (NORVASC) 5 MG tablet   Hypertensive cardiovascular disease- mild LVH, grade 1 diastolic dysfunction - Primary (Chronic)   Blood pressure is well-controlled, but  current regimen may contribute to orthostatic dizziness. - Reduce amlodipine to 5 mg daily; enalapril 10 mg daily - Change metoprolol succinate (Toprol-XL) 50 mg administration to bedtime -Continue daily furosemide 20 mg along with Klor-Con 20 mg daily  -Additional dose of furosemide.  Worsening swelling      Relevant Medications   amLODipine (NORVASC) 5 MG tablet   Other Relevant Orders   EKG 12-Lead (Completed)     Other   Bilateral lower extremity edema (Chronic)   Swelling in legs, especially ankles, managed with support hose and furosemide. - Continue furosemide daily - Advise taking an extra dose of furosemide if swelling worsens  Probably complicated by longstanding lymphedema managed with exercises and support hose. - Continue lymphedema exercises - Use support hose to manage swelling -Continue low-dose furosemide      Orthostatic dizziness (Chronic)   Dizziness likely due to orthostatic hypotension from antihypertensive regimen, particularly  amlodipine. - Reduce amlodipine to 5 mg daily - Change metoprolol to succinate administration to bedtime -Encourage adequate hydration and p.o. intake          Follow-Up: Return in about 1 year (around 09/08/2024).     Signed, Marykay Lex, MD, MS Bryan Lemma, M.D., M.S. Interventional Cardiologist  [facility]  Pager # [phone] Phone # [phone] [address] [place], [place] [address]

## 2023-09-09 ENCOUNTER — Encounter: Payer: Self-pay | Admitting: Cardiology

## 2023-09-09 ENCOUNTER — Ambulatory Visit: Payer: Medicare Other | Attending: Cardiology | Admitting: Cardiology

## 2023-09-09 VITALS — BP 110/64 | HR 59 | Ht 59.0 in | Wt 137.0 lb

## 2023-09-09 DIAGNOSIS — R42 Dizziness and giddiness: Secondary | ICD-10-CM | POA: Diagnosis not present

## 2023-09-09 DIAGNOSIS — I110 Hypertensive heart disease with heart failure: Secondary | ICD-10-CM

## 2023-09-09 DIAGNOSIS — R600 Localized edema: Secondary | ICD-10-CM

## 2023-09-09 DIAGNOSIS — I2510 Atherosclerotic heart disease of native coronary artery without angina pectoris: Secondary | ICD-10-CM

## 2023-09-09 DIAGNOSIS — I5042 Chronic combined systolic (congestive) and diastolic (congestive) heart failure: Secondary | ICD-10-CM | POA: Diagnosis not present

## 2023-09-09 DIAGNOSIS — E782 Mixed hyperlipidemia: Secondary | ICD-10-CM | POA: Diagnosis not present

## 2023-09-09 DIAGNOSIS — I420 Dilated cardiomyopathy: Secondary | ICD-10-CM

## 2023-09-09 DIAGNOSIS — I10 Essential (primary) hypertension: Secondary | ICD-10-CM

## 2023-09-09 MED ORDER — AMLODIPINE BESYLATE 5 MG PO TABS: 5.0000 mg | ORAL_TABLET | Freq: Every day | ORAL | 0 refills | Status: DC

## 2023-09-09 NOTE — Patient Instructions (Addendum)
 Medication Instructions:    Change taking  Metoprolol Succinate at bedtime    Decrease taking Amlodipine to 5 mg daily   *If you need a refill on your cardiac medications before your next appointment, please call your pharmacy*   Lab Work: Not needed    Testing/Procedures: Not needed   Follow-Up: At [facility], you and your health needs are our priority.  As part of our continuing mission to provide you with exceptional heart care, we have created designated Provider Care Teams.  These Care Teams include your primary Cardiologist (physician) and Advanced Practice Providers (APPs -  Physician Assistants and Nurse Practitioners) who all work together to provide you with the care you need, when you need it.     Your next appointment:   12 month(s)  The format for your next appointment:   In Person  Provider:   Bryan Lemma, MD    s

## 2023-09-10 ENCOUNTER — Other Ambulatory Visit: Payer: Self-pay | Admitting: Nurse Practitioner

## 2023-09-10 DIAGNOSIS — E042 Nontoxic multinodular goiter: Secondary | ICD-10-CM

## 2023-09-11 ENCOUNTER — Encounter: Payer: Self-pay | Admitting: Cardiology

## 2023-09-11 DIAGNOSIS — R42 Dizziness and giddiness: Secondary | ICD-10-CM | POA: Insufficient documentation

## 2023-09-11 NOTE — Assessment & Plan Note (Signed)
 Blood pressure is well-controlled, but current regimen may contribute to orthostatic dizziness. - Reduce amlodipine to 5 mg daily; enalapril 10 mg daily - Change metoprolol succinate (Toprol-XL) 50 mg administration to bedtime -Continue daily furosemide 20 mg along with Klor-Con 20 mg daily  -Additional dose of furosemide.  Worsening swelling

## 2023-09-11 NOTE — Assessment & Plan Note (Signed)
 Tardy LDL less than 100.  Stable on most recent check of 97.  Labs are followed by PCP. -Continue atorvastatin 20 mg daily

## 2023-09-11 NOTE — Assessment & Plan Note (Addendum)
 Swelling in legs, especially ankles, managed with support hose and furosemide. - Continue furosemide daily - Advise taking an extra dose of furosemide if swelling worsens  Probably complicated by longstanding lymphedema managed with exercises and support hose. - Continue lymphedema exercises - Use support hose to manage swelling -Continue low-dose furosemide

## 2023-09-11 NOTE — Assessment & Plan Note (Addendum)
 Dizziness likely due to orthostatic hypotension from antihypertensive regimen, particularly amlodipine. - Reduce amlodipine to 5 mg daily - Change metoprolol to succinate administration to bedtime -Encourage adequate hydration and p.o. intake

## 2023-09-16 ENCOUNTER — Ambulatory Visit
Admission: RE | Admit: 2023-09-16 | Discharge: 2023-09-16 | Disposition: A | Discharge status: Home / Self Care | Source: Ambulatory Visit | Attending: Nurse Practitioner | Admitting: Nurse Practitioner

## 2023-09-16 DIAGNOSIS — E042 Nontoxic multinodular goiter: Secondary | ICD-10-CM

## 2023-09-23 NOTE — H&P (View-Only) (Signed)
 Referring Provider: Kathyleen Parkins, MD Primary Care Physician:  Kathyleen Parkins, MD Primary Gastroenterologist:  Dr. Mordechai April  Chief Complaint  Patient presents with   esophageal thickening    HPI:   Angelica [name] is a 89 y.o. female presenting today at the request of Kathyleen Parkins, MD for esophageal thickening.   CT A/P with contrast 08/29/23 showed mild symmetric distal esophageal wall thickening.   Today:  Large pills getting hung and requiring a lot of water to push down.  No issues with foods or liquids.  No odynophagia.  Chronic GERD controlled with pantoprazole .    Reports weight has been fairly stable recently.   Bowels moving regularly. No brbpr or melena.   Rare use of Excedrin.   Lies alone. Takes care of herself, house work, Education officer, environmental, cooking, Catering manager.   Past Medical History:  Diagnosis Date   Anemia    Arthritis    Breast cancer (HCC) 2011   left; no adjuvent therapy, Dr. Anthon Baston   Chronic bronchitis    Colon polyps    Previous colonoscopy by Dr. Merna Aase   Coronary artery disease, non-occlusive 2006   Roughly 40% RCA.; Negative Myoview  in June 2013   Diverticulosis of colon    Gout    Hemorrhoids    HTN (hypertension)    Hyperlipidemia    Hypothyroid    MI (myocardial infarction) (HCC) 1973   By report.    Past Surgical History:  Procedure Laterality Date   ABDOMINAL HYSTERECTOMY     TAH   CARDIAC CATHETERIZATION  06/11/2004   Roughly 40% RCA lesion, otherwise normal.   CATARACT EXTRACTION W/PHACO  07/02/2011   Procedure: CATARACT EXTRACTION PHACO AND INTRAOCULAR LENS PLACEMENT (IOC);  Surgeon: Clay Cummins, MD;  Location: [facility];  Service: Ophthalmology;  Laterality: Left;  CDE:3.45   CATARACT EXTRACTION W/PHACO  04/17/2012   Procedure: CATARACT EXTRACTION PHACO AND INTRAOCULAR LENS PLACEMENT (IOC);  Surgeon: Anner Kill, MD;  Location: [facility];  Service: Ophthalmology;  Laterality: Right;  CDE:13.40   CHOLECYSTECTOMY     COLONOSCOPY      ?3  years ago. Per pt, diverticulosis, hemorrhoids   COLONOSCOPY  04/06/2011   SLF: 1. Sessile polyp at the ileocecal valve 2. Diverticulosis, moderate 3. Internal hemorrhoids, Large-causing rectal bleeding associated with straining. tubular adenoma.   HEMORRHOID SURGERY     LAPAROSCOPIC LYSIS OF ADHESIONS  06/11/2006   Dr. Lynnell Sato with repair of incisional hernia noted -    MASTECTOMY  06/11/2009   left   NM MYOVIEW  LTD  06/01/2021   Reduced LVEF of 40 to 45%.  No evidence of ischemia or infarction.  Read as intermediate risk because of reduced function.   TRANSTHORACIC ECHOCARDIOGRAM  12/2013   a) June 2013: Normal EF, no significant valve disease.; b) July 2015: EF 50-55%, Gr1 DD, mild LVH.   TRANSTHORACIC ECHOCARDIOGRAM  06/28/2021   Ordered due to low EF on Myoview :  EF 40 to 45%.  Mildly decreased function.  No RWMA.  GR 1 DD-mild LA dilation..  Normal PASP with mildly elevated RAP.  Normal RV function.    Current Outpatient Medications  Medication Sig Dispense Refill   allopurinol  (ZYLOPRIM ) 300 MG tablet Take 300 mg by mouth daily.       amLODipine  (NORVASC ) 5 MG tablet Take 1 tablet (5 mg total) by mouth daily. 90 tablet 0   aspirin  EC 81 MG tablet Take 81 mg by mouth daily.     atorvastatin  (LIPITOR) 20 MG  tablet TAKE (1) TABLET BY MOUTH AT BEDTIME FOR CHOLESTEROL. 90 tablet 3   enalapril  (VASOTEC ) 10 MG tablet Take 10 mg by mouth daily.     ergocalciferol  (VITAMIN D2) 1.25 MG (50000 UT) capsule Take 1 capsule (50,000 Units total) by mouth once a week. 5 capsule 11   furosemide  (LASIX ) 20 MG tablet Take 1 tablet (20 mg total) by mouth daily. Ok to take additional 20 mg AS NEEDED 120 tablet 3   levothyroxine  (SYNTHROID , LEVOTHROID) 50 MCG tablet Take 1 tablet (50 mcg total) by mouth daily. 90 tablet 3   metoprolol  succinate (TOPROL -XL) 50 MG 24 hr tablet Take 50 mg by mouth at bedtime. Take with or immediately following a meal.     Multiple Vitamins-Minerals (MULTIVITAMINS THER.  W/MINERALS) TABS Take 1 tablet by mouth daily.       nitroGLYCERIN  (NITROSTAT ) 0.4 MG SL tablet DISSOLVE 1 TABLET UNDER TONGUE EVERY 5 MINUTES AS NEEDED FOR CHEST PAIN. 25 tablet 4   pantoprazole  (PROTONIX ) 40 MG tablet TAKE ONE TABLET BY MOUTH DAILY. 90 tablet 3   potassium chloride  SA (KLOR-CON  M) 20 MEQ tablet TAKE 1 TABLET DAILY (PLEASE CALL MD OFFICE TO SCHEDULE A YEARLY APPT WITH DR HARDING FOR MARCH 2025 BEFORE ANYMORE REFILLS [phone]) 90 tablet 3   Propylene Glycol 0.6 % SOLN Place 1 drop into both eyes 2 (two) times daily.     No current facility-administered medications for this visit.    Allergies as of 09/25/2023 - Review Complete 09/25/2023  Allergen Reaction Noted   Bee venom Anaphylaxis 10/22/2011   Ciprofloxacin Nausea And Vomiting 03/14/2011   Clindamycin hcl  03/14/2011   Flagyl [metronidazole hcl] Nausea And Vomiting 03/14/2011   Shellfish-derived products  03/26/2011   Sulfa antibiotics Nausea Only 03/14/2011    Family History  Problem Relation Age of Onset   Prostate cancer Father    Breast cancer Sister    Breast cancer Other        paternal niece   Colon cancer Neg Hx    Anesthesia problems Neg Hx    Hypotension Neg Hx    Malignant hyperthermia Neg Hx    Pseudochol deficiency Neg Hx    Liver disease Neg Hx     Social History   Socioeconomic History   Marital status: Married    Spouse name: Not on file   Number of children: 2   Years of education: Not on file   Highest education level: Not on file  Occupational History   Occupation: retired    Associate Professor: RETIRED  Tobacco Use   Smoking status: Never   Smokeless tobacco: Never  Substance and Sexual Activity   Alcohol  use: No   Drug use: No   Sexual activity: Not Currently  Other Topics Concern   Not on file  Social History Narrative   She is a very pleasant recently widowed African American woman with 2 children and 3 grandchildren.       She is married to her husband for close to 60  years.  He was a retired Advice worker.     She continues to live on a large farm in the [place] area with a roughly 1/4 mile driveway.    She also enjoys doing gardening.   Social Drivers of Corporate investment banker Strain: Not on file  Food Insecurity: No Food Insecurity (03/19/2022)   Hunger Vital Sign    Worried About Running Out of Food in the Last Year: Never  true    Ran Out of Food in the Last Year: Never true  Transportation Needs: No Transportation Needs (03/19/2022)   PRAPARE - Administrator, Civil Service (Medical): No    Lack of Transportation (Non-Medical): No  Physical Activity: Not on file  Stress: Not on file  Social Connections: Not on file  Intimate Partner Violence: Not on file    Review of Systems: Gen: Denies any fever, chills, cold or flulike symptoms, presyncope, syncope. CV: Denies chest pain, heart palpitations. Resp: Denies shortness of breath, cough. GI: See HPI GU : Denies urinary burning, urinary frequency, urinary hesitancy MS: Denies joint pain. Derm: Denies rash. Psych: Denies depression, anxiety. Heme: See HPI  Physical Exam: BP 137/83 (BP Location: Right Arm, Patient Position: Sitting, Cuff Size: Normal)   Pulse 66   Temp 97.9 F (36.6 C) (Temporal)   Ht 4\' 11"  (1.499 m)   Wt 134 lb 12.8 oz (61.1 kg)   BMI 27.23 kg/m  General:   Alert and oriented. Pleasant and cooperative. Well-nourished and well-developed.  Head:  Normocephalic and atraumatic. Eyes:  Without icterus, sclera clear and conjunctiva pink.  Ears:  Normal auditory acuity. Lungs:  Clear to auscultation bilaterally. No wheezes, rales, or rhonchi. No distress.  Heart:  S1, S2 present without murmurs appreciated.  Abdomen:  +BS, soft, and non-distended. Mild TTP in LLQ. No HSM noted. No guarding or rebound. No masses appreciated.  Rectal:  Deferred  Msk:  Symmetrical without gross deformities. Normal posture. Extremities:  Without  edema. Neurologic:  Alert and  oriented x4;  grossly normal neurologically. Skin:  Intact without significant lesions or rashes. Psych:   Normal mood and affect.    Assessment:  89 year old female with history of breast cancer, CAD, MI, HTN, HLD, hypothyroidism, GERD, presenting today at request of Dr. Lewayne Records for further evaluation of esophageal thickening.  She had CT A/P with contrast 08/29/2023 that showed mild symmetric distal esophageal wall thickening.  Patient reports intermittent pill dysphagia, but no issues with foods or liquids.  Denies odynophagia.  Chronic GERD well-controlled on pantoprazole  daily.  Denies recent unintentional weight loss, BRBPR, melena, or other significant symptoms.  Etiology of esophageal wall thickening and dysphagia is unclear.  Needs EGD to rule out esophagitis, esophageal web, ring, stricture, and malignancy.  Left lower quadrant tenderness to palpation: Patient reports no pain in the left lower quadrant area unless I am pressing on her abdomen.  Symptoms are mild.  No lower GI symptoms.  Recent CT in March for LLQ abdominal pain with no significant abnormalities.  Discussed the possibility of a colonoscopy, but patient prefers to avoid this if possible.  Recommended adding fiber daily and monitoring symptoms.   Plan:  Proceed with upper endoscopy +/- dilation with propofol by Dr. Mordechai April in near future. The risks, benefits, and alternatives have been discussed with the patient in detail. The patient states understanding and desires to proceed.  ASA 3 Continue pantoprazole  daily. Add Benefiber daily.  Follow instructions on container. Patient will monitor for worsening LLQ abdominal pain, change in bowel habits, rectal bleeding, unintentional weight loss, and notify me if this occurs. Follow-up after EGD.   Shana Daring, PA-C [facility] 09/25/2023

## 2023-09-23 NOTE — Progress Notes (Unsigned)
 Referring Provider: Elfredia Nevins, MD Primary Care Physician:  Elfredia Nevins, MD Primary Gastroenterologist:  Dr. Marletta Lor  No chief complaint on file.   HPI:   Angelica [name] is a 89 y.o. female presenting today at the request of Elfredia Nevins, MD for esophageal thickening.   CT A/P with contrast 08/29/23 showed mild symmetric distal esophageal wall thickening.   Today:     History of LAR with reanastomosis***  Past Medical History:  Diagnosis Date   Anemia    Arthritis    Breast cancer (HCC) 2011   left; no adjuvent therapy, Dr. Cleone Slim   Chronic bronchitis    Colon polyps    Previous colonoscopy by Dr. Elpidio Anis   Coronary artery disease, non-occlusive 2006   Roughly 40% RCA.; Negative Myoview in June 2013   Diverticulosis of colon    Gout    Hemorrhoids    HTN (hypertension)    Hyperlipidemia    Hypothyroid    MI (myocardial infarction) (HCC) 1973   By report.    Past Surgical History:  Procedure Laterality Date   ABDOMINAL HYSTERECTOMY     TAH   CARDIAC CATHETERIZATION  06/11/2004   Roughly 40% RCA lesion, otherwise normal.   CATARACT EXTRACTION W/PHACO  07/02/2011   Procedure: CATARACT EXTRACTION PHACO AND INTRAOCULAR LENS PLACEMENT (IOC);  Surgeon: Susa Simmonds, MD;  Location: [facility];  Service: Ophthalmology;  Laterality: Left;  CDE:3.45   CATARACT EXTRACTION W/PHACO  04/17/2012   Procedure: CATARACT EXTRACTION PHACO AND INTRAOCULAR LENS PLACEMENT (IOC);  Surgeon: Gemma Payor, MD;  Location: [facility];  Service: Ophthalmology;  Laterality: Right;  CDE:13.40   CHOLECYSTECTOMY     COLONOSCOPY      ?3 years ago. Per pt, diverticulosis, hemorrhoids   COLONOSCOPY  04/06/2011   SLF: 1. Sessile polyp at the ileocecal valve 2. Diverticulosis, moderate 3. Internal hemorrhoids, Large-causing rectal bleeding associated with straining. tubular adenoma.   HEMORRHOID SURGERY     LAPAROSCOPIC LYSIS OF ADHESIONS  06/11/2006   Dr. Abbey Chatters with repair of  incisional hernia noted -    MASTECTOMY  06/11/2009   left   NM MYOVIEW LTD  06/01/2021   Reduced LVEF of 40 to 45%.  No evidence of ischemia or infarction.  Read as intermediate risk because of reduced function.   TRANSTHORACIC ECHOCARDIOGRAM  12/2013   a) June 2013: Normal EF, no significant valve disease.; b) July 2015: EF 50-55%, Gr1 DD, mild LVH.   TRANSTHORACIC ECHOCARDIOGRAM  06/28/2021   Ordered due to low EF on Myoview:  EF 40 to 45%.  Mildly decreased function.  No RWMA.  GR 1 DD-mild LA dilation..  Normal PASP with mildly elevated RAP.  Normal RV function.    Current Outpatient Medications  Medication Sig Dispense Refill   allopurinol (ZYLOPRIM) 300 MG tablet Take 300 mg by mouth daily.       amLODipine (NORVASC) 5 MG tablet Take 1 tablet (5 mg total) by mouth daily. 90 tablet 0   aspirin EC 81 MG tablet Take 81 mg by mouth daily.     atorvastatin (LIPITOR) 20 MG tablet TAKE (1) TABLET BY MOUTH AT BEDTIME FOR CHOLESTEROL. 90 tablet 3   enalapril (VASOTEC) 10 MG tablet Take 10 mg by mouth daily.     ergocalciferol (VITAMIN D2) 1.25 MG (50000 UT) capsule Take 1 capsule (50,000 Units total) by mouth once a week. 5 capsule 11   furosemide (LASIX) 20 MG tablet Take 1 tablet (20 mg total) by  mouth daily. Ok to take additional 20 mg AS NEEDED 120 tablet 3   levothyroxine (SYNTHROID, LEVOTHROID) 50 MCG tablet Take 1 tablet (50 mcg total) by mouth daily. 90 tablet 3   metoprolol succinate (TOPROL-XL) 50 MG 24 hr tablet Take 50 mg by mouth at bedtime. Take with or immediately following a meal.     Multiple Vitamins-Minerals (MULTIVITAMINS THER. W/MINERALS) TABS Take 1 tablet by mouth daily.       nitroGLYCERIN (NITROSTAT) 0.4 MG SL tablet DISSOLVE 1 TABLET UNDER TONGUE EVERY 5 MINUTES AS NEEDED FOR CHEST PAIN. 25 tablet 4   pantoprazole (PROTONIX) 40 MG tablet TAKE ONE TABLET BY MOUTH DAILY. 90 tablet 3   potassium chloride SA (KLOR-CON M) 20 MEQ tablet TAKE 1 TABLET DAILY (PLEASE CALL MD  OFFICE TO SCHEDULE A YEARLY APPT WITH DR HARDING FOR MARCH 2025 BEFORE ANYMORE REFILLS [phone]) 90 tablet 3   Propylene Glycol 0.6 % SOLN Place 1 drop into both eyes 2 (two) times daily.     No current facility-administered medications for this visit.    Allergies as of 09/25/2023 - Review Complete 09/11/2023  Allergen Reaction Noted   Bee venom Anaphylaxis 10/22/2011   Ciprofloxacin Nausea And Vomiting 03/14/2011   Clindamycin hcl  03/14/2011   Flagyl [metronidazole hcl] Nausea And Vomiting 03/14/2011   Shellfish-derived products  03/26/2011   Sulfa antibiotics Nausea Only 03/14/2011    Family History  Problem Relation Age of Onset   Prostate cancer Father    Breast cancer Sister    Breast cancer Other        paternal niece   Colon cancer Neg Hx    Anesthesia problems Neg Hx    Hypotension Neg Hx    Malignant hyperthermia Neg Hx    Pseudochol deficiency Neg Hx    Liver disease Neg Hx     Social History   Socioeconomic History   Marital status: Married    Spouse name: Not on file   Number of children: 2   Years of education: Not on file   Highest education level: Not on file  Occupational History   Occupation: retired    Associate Professor: RETIRED  Tobacco Use   Smoking status: Never   Smokeless tobacco: Never  Substance and Sexual Activity   Alcohol use: No   Drug use: No   Sexual activity: Not Currently  Other Topics Concern   Not on file  Social History Narrative   She is a very pleasant recently widowed African American woman with 2 children and 3 grandchildren.       She is married to her husband for close to 60 years.  He was a retired Advice worker.     She continues to live on a large farm in the [place] area with a roughly 1/4 mile driveway.    She also enjoys doing gardening.   Social Drivers of Corporate investment banker Strain: Not on file  Food Insecurity: No Food Insecurity (03/19/2022)   Hunger Vital Sign    Worried About Running  Out of Food in the Last Year: Never true    Ran Out of Food in the Last Year: Never true  Transportation Needs: No Transportation Needs (03/19/2022)   PRAPARE - Administrator, Civil Service (Medical): No    Lack of Transportation (Non-Medical): No  Physical Activity: Not on file  Stress: Not on file  Social Connections: Not on file  Intimate Partner Violence: Not on file  Review of Systems: Gen: Denies any fever, chills, fatigue, weight loss, lack of appetite.  CV: Denies chest pain, heart palpitations, peripheral edema, syncope.  Resp: Denies shortness of breath at rest or with exertion. Denies wheezing or cough.  GI: Denies dysphagia or odynophagia. Denies jaundice, hematemesis, fecal incontinence. GU : Denies urinary burning, urinary frequency, urinary hesitancy MS: Denies joint pain, muscle weakness, cramps, or limitation of movement.  Derm: Denies rash, itching, dry skin Psych: Denies depression, anxiety, memory loss, and confusion Heme: Denies bruising, bleeding, and enlarged lymph nodes.  Physical Exam: There were no vitals taken for this visit. General:   Alert and oriented. Pleasant and cooperative. Well-nourished and well-developed.  Head:  Normocephalic and atraumatic. Eyes:  Without icterus, sclera clear and conjunctiva pink.  Ears:  Normal auditory acuity. Lungs:  Clear to auscultation bilaterally. No wheezes, rales, or rhonchi. No distress.  Heart:  S1, S2 present without murmurs appreciated.  Abdomen:  +BS, soft, non-tender and non-distended. No HSM noted. No guarding or rebound. No masses appreciated.  Rectal:  Deferred  Msk:  Symmetrical without gross deformities. Normal posture. Extremities:  Without edema. Neurologic:  Alert and  oriented x4;  grossly normal neurologically. Skin:  Intact without significant lesions or rashes. Psych:  Alert and cooperative. Normal mood and affect.    Assessment:     Plan:  ***   Shana Daring,  PA-C [facility] 09/25/2023

## 2023-09-25 ENCOUNTER — Encounter: Payer: Self-pay | Admitting: Gastroenterology

## 2023-09-25 ENCOUNTER — Ambulatory Visit: Admitting: Gastroenterology

## 2023-09-25 ENCOUNTER — Encounter: Payer: Self-pay | Admitting: *Deleted

## 2023-09-25 VITALS — BP 137/83 | HR 66 | Temp 97.9°F | Ht 59.0 in | Wt 134.8 lb

## 2023-09-25 DIAGNOSIS — R10814 Left lower quadrant abdominal tenderness: Secondary | ICD-10-CM | POA: Diagnosis not present

## 2023-09-25 DIAGNOSIS — K219 Gastro-esophageal reflux disease without esophagitis: Secondary | ICD-10-CM | POA: Diagnosis not present

## 2023-09-25 DIAGNOSIS — R1310 Dysphagia, unspecified: Secondary | ICD-10-CM | POA: Diagnosis not present

## 2023-09-25 DIAGNOSIS — R933 Abnormal findings on diagnostic imaging of other parts of digestive tract: Secondary | ICD-10-CM | POA: Diagnosis not present

## 2023-09-25 NOTE — Patient Instructions (Addendum)
 We will get you scheduled for an upper endoscopy with possible stretching of your esophagus with Dr. Mordechai April.   Add benefiber daily.  You can follow the instructions on container.  Let me know if you have any worsening left lower quadrant abdominal pain, change in bowel habits, rectal bleeding, unintentional weight loss.  I will plan to see you back in the office after your procedure.    Shana Daring, PA-C [facility]

## 2023-10-09 ENCOUNTER — Encounter (HOSPITAL_COMMUNITY)

## 2023-10-09 NOTE — Patient Instructions (Signed)
 [place] FAILS  10/09/2023     @PREFPERIOPPHARMACY @   Your procedure is scheduled on 5/5.  Report to [facility] at 0830 A.M.  Call this number if you have problems the morning of surgery:  [phone]  If you experience any cold or flu symptoms such as cough, fever, chills, shortness of breath, etc. between now and your scheduled surgery, please notify us  at the above number.   Remember:  Do not eat or drink after midnight.  You may drink clear liquids until 0630 .  Clear liquids allowed are:                    Water, Juice (No red color; non-citric and without pulp; diabetics please choose diet or no sugar options), Carbonated beverages (diabetics please choose diet or no sugar options), Clear Tea (No creamer, milk, or cream, including half & half and powdered creamer), and Black Coffee Only (No creamer, milk or cream, including half & half and powdered creamer)    Take these medicines the morning of surgery with A SIP OF WATER amlodipine , levothyroxine , metoprolol , and protonix     Do not wear jewelry, make-up or nail polish, including gel polish,  artificial nails, or any other type of covering on natural nails (fingers and  toes).  Do not wear lotions, powders, or perfumes, or deodorant.  Do not shave 48 hours prior to surgery.  Men may shave face and neck.  Do not bring valuables to the hospital.  [facility] is not responsible for any belongings or valuables.  Contacts, dentures or bridgework may not be worn into surgery.  Leave your suitcase in the car.  After surgery it may be brought to your room.  For patients admitted to the hospital, discharge time will be determined by your treatment team.  Patients discharged the day of surgery will not be allowed to drive home.   Name and phone number of your driver:   family Special instructions:  Please follow special instructions in letter sent from Dr Cherisse Cornell office.  Please read over the following fact sheets that you were  given. Anesthesia Post-op Instructions and Care and Recovery After Surgery      Upper Endoscopy, Adult, Care After After the procedure, it is common to have a sore throat. It is also common to have: Mild stomach pain or discomfort. Bloating. Nausea. Follow these instructions at home: The instructions below may help you care for yourself at home. Your health care provider may give you more instructions. If you have questions, ask your health care provider. If you were given a sedative during the procedure, it can affect you for several hours. Do not drive or operate machinery until your health care provider says that it is safe. If you will be going home right after the procedure, plan to have a responsible adult: Take you home from the hospital or clinic. You will not be allowed to drive. Care for you for the time you are told. Follow instructions from your health care provider about what you may eat and drink. Return to your normal activities as told by your health care provider. Ask your health care provider what activities are safe for you. Take over-the-counter and prescription medicines only as told by your health care provider. Contact a health care provider if you: Have a sore throat that lasts longer than one day. Have trouble swallowing. Have a fever. Get help right away if you: Vomit blood or your vomit looks  like coffee grounds. Have bloody, black, or tarry stools. Have a very bad sore throat or you cannot swallow. Have difficulty breathing or very bad pain in your chest or abdomen. These symptoms may be an emergency. Get help right away. Call 911. Do not wait to see if the symptoms will go away. Do not drive yourself to the hospital. Summary After the procedure, it is common to have a sore throat, mild stomach discomfort, bloating, and nausea. If you were given a sedative during the procedure, it can affect you for several hours. Do not drive until your health care  provider says that it is safe. Follow instructions from your health care provider about what you may eat and drink. Return to your normal activities as told by your health care provider. This information is not intended to replace advice given to you by your health care provider. Make sure you discuss any questions you have with your health care provider. Document Revised: 09/06/2021 Document Reviewed: 09/06/2021 Elsevier Patient Education  2024 [employer].Upper Endoscopy, Adult Upper endoscopy is a procedure to look inside the upper GI (gastrointestinal) tract. The upper GI tract is made up of: The esophagus. This is the part of the body that moves food from your mouth to your stomach. The stomach. The duodenum. This is the first part of your small intestine. This procedure is also called esophagogastroduodenoscopy (EGD) or gastroscopy. In this procedure, your health care provider passes a thin, flexible tube (endoscope) through your mouth and down your esophagus into your stomach and into your duodenum. A small camera is attached to the end of the tube. Images from the camera appear on a monitor in the exam room. During this procedure, your health care provider may also remove a small piece of tissue to be sent to a lab and examined under a microscope (biopsy). Your health care provider may do an upper endoscopy to diagnose cancers of the upper GI tract. You may also have this procedure to find the cause of other conditions, such as: Stomach pain. Heartburn. Pain or problems when swallowing. Nausea and vomiting. Stomach bleeding. Stomach ulcers. Tell a health care provider about: Any allergies you have. All medicines you are taking, including vitamins, herbs, eye drops, creams, and over-the-counter medicines. Any problems you or family members have had with anesthetic medicines. Any bleeding problems you have. Any surgeries you have had. Any medical conditions you have. Whether you are  pregnant or may be pregnant. What are the risks? Your healthcare provider will talk with you about risks. These may include: Infection. Bleeding. Allergic reactions to medicines. A tear or hole (perforation) in the esophagus, stomach, or duodenum. What happens before the procedure? When to stop eating and drinking Follow instructions from your health care provider about what you may eat and drink. These may include: 8 hours before your procedure Stop eating most foods. Do not eat meat, fried foods, or fatty foods. Eat only light foods, such as toast or crackers. All liquids are okay except energy drinks and alcohol . 6 hours before your procedure Stop eating. Drink only clear liquids, such as water, clear fruit juice, black coffee, plain tea, and sports drinks. Do not drink energy drinks or alcohol . 2 hours before your procedure Stop drinking all liquids. You may be allowed to take medicines with small sips of water. If you do not follow your health care provider's instructions, your procedure may be delayed or canceled. Medicines Ask your health care provider about: Changing or stopping your  regular medicines. This is especially important if you are taking diabetes medicines or blood thinners. Taking medicines such as aspirin  and ibuprofen. These medicines can thin your blood. Do not take these medicines unless your health care provider tells you to take them. Taking over-the-counter medicines, vitamins, herbs, and supplements. General instructions If you will be going home right after the procedure, plan to have a responsible adult: Take you home from the hospital or clinic. You will not be allowed to drive. Care for you for the time you are told. What happens during the procedure?  An IV will be inserted into one of your veins. You may be given one or more of the following: A medicine to help you relax (sedative). A medicine to numb the throat (local anesthetic). You will lie  on your left side on an exam table. Your health care provider will pass the endoscope through your mouth and down your esophagus. Your health care provider will use the scope to check the inside of your esophagus, stomach, and duodenum. Biopsies may be taken. The endoscope will be removed. The procedure may vary among health care providers and hospitals. What happens after the procedure? Your blood pressure, heart rate, breathing rate, and blood oxygen level will be monitored until you leave the hospital or clinic. When your throat is no longer numb, you may be given some fluids to drink. If you were given a sedative during the procedure, it can affect you for several hours. Do not drive or operate machinery until your health care provider says that it is safe. It is up to you to get the results of your procedure. Ask your health care provider, or the department that is doing the procedure, when your results will be ready. Contact a health care provider if you: Have a sore throat that lasts longer than 1 day. Have a fever. Get help right away if you: Vomit blood or your vomit looks like coffee grounds. Have bloody, black, or tarry stools. Have a very bad sore throat or you cannot swallow. Have difficulty breathing or very bad pain in your chest or abdomen. These symptoms may be an emergency. Get help right away. Call 911. Do not wait to see if the symptoms will go away. Do not drive yourself to the hospital. Summary Upper endoscopy is a procedure to look inside the upper GI tract. During the procedure, an IV will be inserted into one of your veins. You may be given a medicine to help you relax. The endoscope will be passed through your mouth and down your esophagus. Follow instructions from your health care provider about what you can eat and drink. This information is not intended to replace advice given to you by your health care provider. Make sure you discuss any questions you have with  your health care provider. Document Revised: 09/06/2021 Document Reviewed: 09/06/2021 Elsevier Patient Education  2024 [employer].

## 2023-10-10 ENCOUNTER — Encounter (HOSPITAL_COMMUNITY): Payer: Self-pay

## 2023-10-10 ENCOUNTER — Encounter (HOSPITAL_COMMUNITY)
Admission: RE | Admit: 2023-10-10 | Discharge: 2023-10-10 | Disposition: A | Discharge status: Home / Self Care | Source: Ambulatory Visit | Attending: Internal Medicine | Admitting: Internal Medicine

## 2023-10-10 ENCOUNTER — Other Ambulatory Visit: Payer: Self-pay

## 2023-10-10 VITALS — BP 137/83 | HR 66 | Temp 97.9°F | Resp 18 | Ht 59.0 in | Wt 134.8 lb

## 2023-10-10 DIAGNOSIS — Z01818 Encounter for other preprocedural examination: Secondary | ICD-10-CM | POA: Diagnosis not present

## 2023-10-10 LAB — BASIC METABOLIC PANEL WITH GFR
Anion gap: 10 (ref 5–15)
BUN: 16 mg/dL (ref 8–23)
CO2: 26 mmol/L (ref 22–32)
Calcium: 9.3 mg/dL (ref 8.9–10.3)
Chloride: 99 mmol/L (ref 98–111)
Creatinine, Ser: 0.79 mg/dL (ref 0.44–1.00)
GFR, Estimated: >60 mL/min (ref >60)
Glucose, Bld: 74 mg/dL (ref 70–99)
Potassium: 4.5 mmol/L (ref 3.5–5.1)
Sodium: 135 mmol/L (ref 135–145)

## 2023-10-10 LAB — CBC
HCT: 35.5 % — ABNORMAL LOW (ref 36.0–46.0)
Hemoglobin: 11.2 g/dL — ABNORMAL LOW (ref 12.0–15.0)
MCH: 31.5 pg (ref 26.0–34.0)
MCHC: 31.5 g/dL (ref 30.0–36.0)
MCV: 99.7 fL (ref 80.0–100.0)
Platelets: 179 10*3/uL (ref 150–400)
RBC: 3.56 MIL/uL — ABNORMAL LOW (ref 3.87–5.11)
RDW: 14.2 % (ref 11.5–15.5)
WBC: 5.3 10*3/uL (ref 4.0–10.5)
nRBC: 0 % (ref 0.0–0.2)

## 2023-10-14 ENCOUNTER — Ambulatory Visit (HOSPITAL_COMMUNITY)
Admission: RE | Admit: 2023-10-14 | Discharge: 2023-10-14 | Disposition: A | Discharge status: Home / Self Care | Attending: Internal Medicine | Admitting: Internal Medicine

## 2023-10-14 ENCOUNTER — Ambulatory Visit (HOSPITAL_COMMUNITY): Admitting: Anesthesiology

## 2023-10-14 ENCOUNTER — Encounter (HOSPITAL_COMMUNITY)
Admission: RE | Disposition: A | Discharge status: Home / Self Care | Payer: Self-pay | Source: Home / Self Care | Attending: Internal Medicine

## 2023-10-14 ENCOUNTER — Other Ambulatory Visit: Payer: Self-pay

## 2023-10-14 ENCOUNTER — Encounter (HOSPITAL_COMMUNITY): Payer: Self-pay | Admitting: Internal Medicine

## 2023-10-14 DIAGNOSIS — K449 Diaphragmatic hernia without obstruction or gangrene: Secondary | ICD-10-CM | POA: Insufficient documentation

## 2023-10-14 DIAGNOSIS — I10 Essential (primary) hypertension: Secondary | ICD-10-CM

## 2023-10-14 DIAGNOSIS — I2510 Atherosclerotic heart disease of native coronary artery without angina pectoris: Secondary | ICD-10-CM

## 2023-10-14 DIAGNOSIS — K222 Esophageal obstruction: Secondary | ICD-10-CM | POA: Diagnosis not present

## 2023-10-14 DIAGNOSIS — Z79899 Other long term (current) drug therapy: Secondary | ICD-10-CM | POA: Diagnosis not present

## 2023-10-14 DIAGNOSIS — R933 Abnormal findings on diagnostic imaging of other parts of digestive tract: Secondary | ICD-10-CM | POA: Insufficient documentation

## 2023-10-14 DIAGNOSIS — I252 Old myocardial infarction: Secondary | ICD-10-CM | POA: Insufficient documentation

## 2023-10-14 DIAGNOSIS — K219 Gastro-esophageal reflux disease without esophagitis: Secondary | ICD-10-CM | POA: Diagnosis not present

## 2023-10-14 DIAGNOSIS — R1310 Dysphagia, unspecified: Secondary | ICD-10-CM | POA: Diagnosis present

## 2023-10-14 HISTORY — PX: ESOPHAGOGASTRODUODENOSCOPY: SHX5428

## 2023-10-14 HISTORY — PX: ESOPHAGEAL DILATION: SHX303

## 2023-10-14 SURGERY — EGD (ESOPHAGOGASTRODUODENOSCOPY)
Anesthesia: General

## 2023-10-14 MED ORDER — LACTATED RINGERS IV SOLN
INTRAVENOUS | Status: DC
Start: 2023-10-14 — End: 2023-10-14

## 2023-10-14 MED ORDER — PROPOFOL 500 MG/50ML IV EMUL
INTRAVENOUS | Status: DC | PRN
  Administered 2023-10-14: 150 ug/kg/min via INTRAVENOUS

## 2023-10-14 MED ORDER — PROPOFOL 10 MG/ML IV BOLUS
INTRAVENOUS | Status: DC | PRN
  Administered 2023-10-14: 50 mg via INTRAVENOUS

## 2023-10-14 MED ORDER — LIDOCAINE 2% (20 MG/ML) 5 ML SYRINGE
INTRAMUSCULAR | Status: DC | PRN
  Administered 2023-10-14: 40 mg via INTRAVENOUS

## 2023-10-14 NOTE — Op Note (Signed)
 [facility] Patient Name: Angelica [name] Procedure Date: 10/14/2023 10:54 AM MRN: [id number] Date of Birth: 08-Jun-1933 Attending MD: Rolando Cliche. Mordechai April , [place], [id number] CSN: [id number] Age: 89 Admit Type: Outpatient Procedure:                Upper GI endoscopy Indications:              Dysphagia, Abnormal CT of the GI tract Providers:                Rolando Cliche. Mordechai April, DO, Graydon Lazier RN, RN, Kimball Pence Tech, Technician Referring MD:              Medicines:                See the Anesthesia note for documentation of the                            administered medications Complications:            No immediate complications. Estimated Blood Loss:     Estimated blood loss was minimal. Procedure:                Pre-Anesthesia Assessment:                           - The anesthesia plan was to use monitored                            anesthesia care (MAC).                           After obtaining informed consent, the endoscope was                            passed under direct vision. Throughout the                            procedure, the patient's blood pressure, pulse, and                            oxygen saturations were monitored continuously. The                            GIF-H190 ([id number]) scope was introduced through the                            mouth, and advanced to the second part of duodenum.                            The upper GI endoscopy was accomplished without                            difficulty. The patient tolerated the procedure  well. Scope In: 11:16:05 AM Scope Out: 11:19:03 AM Total Procedure Duration: 0 hours 2 minutes 58 seconds  Findings:      A small hiatal hernia was present.      A mild Schatzki ring was found in the distal esophagus. A TTS dilator       was passed through the scope. Dilation with a 15-16.5-18 mm balloon       dilator was performed to 18 mm. The dilation site was examined and        showed mild mucosal disruption and moderate improvement in luminal       narrowing.      The entire examined stomach was normal.      The duodenal bulb, first portion of the duodenum and second portion of       the duodenum were normal. Impression:               - Small hiatal hernia.                           - Mild Schatzki ring. Dilated.                           - Normal stomach.                           - Normal duodenal bulb, first portion of the                            duodenum and second portion of the duodenum.                           - No specimens collected. Moderate Sedation:      Per Anesthesia Care Recommendation:           - Patient has a contact number available for                            emergencies. The signs and symptoms of potential                            delayed complications were discussed with the                            patient. Return to normal activities tomorrow.                            Written discharge instructions were provided to the                            patient.                           - Resume previous diet.                           - Continue present medications.                           -  Await pathology results.                           - Repeat upper endoscopy PRN for retreatment.                           - Return to GI clinic in 2 months. Procedure Code(s):        --- Professional ---                           [phone], Esophagogastroduodenoscopy, flexible,                            transoral; with transendoscopic balloon dilation of                            esophagus (less than 30 mm diameter) Diagnosis Code(s):        --- Professional ---                           K44.9, Diaphragmatic hernia without obstruction or                            gangrene                           K22.2, Esophageal obstruction                           R13.10, Dysphagia, unspecified                           R93.3, Abnormal  findings on diagnostic imaging of                            other parts of digestive tract CPT copyright 2022 American Medical Association. All rights reserved. The codes documented in this report are preliminary and upon coder review may  be revised to meet current compliance requirements. Rolando Cliche. Mordechai April, DO Rolando Cliche. Mordechai April, DO 10/14/2023 11:27:59 AM This report has been signed electronically. Number of Addenda: 0

## 2023-10-14 NOTE — Anesthesia Preprocedure Evaluation (Signed)
 Anesthesia Evaluation  Patient identified by MRN, date of birth, ID band Patient awake    Reviewed: Allergy & Precautions, H&P , NPO status , Patient's Chart, lab work & pertinent test results, reviewed documented beta blocker date and time   Airway Mallampati: II  TM Distance: >3 FB Neck ROM: full    Dental no notable dental hx.    Pulmonary neg pulmonary ROS, pneumonia   Pulmonary exam normal breath sounds clear to auscultation       Cardiovascular Exercise Tolerance: Good hypertension, + CAD and + Past MI  negative cardio ROS  Rhythm:regular Rate:Normal     Neuro/Psych negative neurological ROS  negative psych ROS   GI/Hepatic negative GI ROS, Neg liver ROS,,,  Endo/Other  negative endocrine ROSHypothyroidism    Renal/GU negative Renal ROS  negative genitourinary   Musculoskeletal   Abdominal   Peds  Hematology negative hematology ROS (+) Blood dyscrasia, anemia   Anesthesia Other Findings   Reproductive/Obstetrics negative OB ROS                             Anesthesia Physical Anesthesia Plan  ASA: 3  Anesthesia Plan: General   Post-op Pain Management:    Induction:   PONV Risk Score and Plan: Propofol infusion  Airway Management Planned:   Additional Equipment:   Intra-op Plan:   Post-operative Plan:   Informed Consent: I have reviewed the patients History and Physical, chart, labs and discussed the procedure including the risks, benefits and alternatives for the proposed anesthesia with the patient or authorized representative who has indicated his/her understanding and acceptance.     Dental Advisory Given  Plan Discussed with: CRNA  Anesthesia Plan Comments:        Anesthesia Quick Evaluation

## 2023-10-14 NOTE — Discharge Instructions (Signed)
 EGD Discharge instructions Please read the instructions outlined below and refer to this sheet in the next few weeks. These discharge instructions provide you with general information on caring for yourself after you leave the hospital. Your doctor may also give you specific instructions. While your treatment has been planned according to the most current medical practices available, unavoidable complications occasionally occur. If you have any problems or questions after discharge, please call your doctor. ACTIVITY You may resume your regular activity but move at a slower pace for the next 24 hours.  Take frequent rest periods for the next 24 hours.  Walking will help expel (get rid of) the air and reduce the bloated feeling in your abdomen.  No driving for 24 hours (because of the anesthesia (medicine) used during the test).  You may shower.  Do not sign any important legal documents or operate any machinery for 24 hours (because of the anesthesia used during the test).  NUTRITION Drink plenty of fluids.  You may resume your normal diet.  Begin with a light meal and progress to your normal diet.  Avoid alcoholic beverages for 24 hours or as instructed by your caregiver.  MEDICATIONS You may resume your normal medications unless your caregiver tells you otherwise.  WHAT YOU CAN EXPECT TODAY You may experience abdominal discomfort such as a feeling of fullness or "gas" pains.  FOLLOW-UP Your doctor will discuss the results of your test with you.  SEEK IMMEDIATE MEDICAL ATTENTION IF ANY OF THE FOLLOWING OCCUR: Excessive nausea (feeling sick to your stomach) and/or vomiting.  Severe abdominal pain and distention (swelling).  Trouble swallowing.  Temperature over 101 F (37.8 C).  Rectal bleeding or vomiting of blood.   Your upper endoscopy revealed a small hiatal hernia.  You also had a tightening of your esophagus called a Schatzki's ring.  I stretched this out today.  Hopefully this  helps with feeling of food getting stuck.  Stomach and small bowel were normal.  Continue on pantoprazole  daily.  Follow-up in GI office in 2 to 3 months.   I hope you have a great rest of your week!  Rolando Cliche. Mordechai April, D.O. [facility]

## 2023-10-14 NOTE — Transfer of Care (Signed)
 Immediate Anesthesia Transfer of Care Note  Patient: Angelica [name]  Procedure(s) Performed: EGD (ESOPHAGOGASTRODUODENOSCOPY) DILATION, ESOPHAGUS  Patient Location: PACU  Anesthesia Type:General  Level of Consciousness: awake and alert   Airway & Oxygen Therapy: Patient Spontanous Breathing  Post-op Assessment: Report given to RN and Post -op Vital signs reviewed and stable  Post vital signs: Reviewed and stable  Last Vitals:  Vitals Value Taken Time  BP    Temp    Pulse    Resp    SpO2      Last Pain:  Vitals:   10/14/23 1111  TempSrc:   PainSc: 0-No pain         Complications: No notable events documented.

## 2023-10-14 NOTE — Interval H&P Note (Signed)
 History and Physical Interval Note:  10/14/2023 10:13 AM  Angelica [name]  has presented today for surgery, with the diagnosis of dysphagia,abnormal CT esophagus.  The various methods of treatment have been discussed with the patient and family. After consideration of risks, benefits and other options for treatment, the patient has consented to  Procedure(s) with comments: EGD (ESOPHAGOGASTRODUODENOSCOPY) (N/A) - 10:30 am, asa 3 DILATION, ESOPHAGUS (N/A) as a surgical intervention.  The patient's history has been reviewed, patient examined, no change in status, stable for surgery.  I have reviewed the patient's chart and labs.  Questions were answered to the patient's satisfaction.     Vinetta Greening

## 2023-10-15 ENCOUNTER — Encounter (HOSPITAL_COMMUNITY): Payer: Self-pay | Admitting: Internal Medicine

## 2023-10-15 NOTE — Anesthesia Postprocedure Evaluation (Signed)
 Anesthesia Post Note  Patient: Trudi Frome Heims  Procedure(s) Performed: EGD (ESOPHAGOGASTRODUODENOSCOPY) DILATION, ESOPHAGUS  Patient location during evaluation: Phase II Anesthesia Type: General Level of consciousness: awake Pain management: pain level controlled Vital Signs Assessment: post-procedure vital signs reviewed and stable Respiratory status: spontaneous breathing and respiratory function stable Cardiovascular status: blood pressure returned to baseline and stable Postop Assessment: no headache and no apparent nausea or vomiting Anesthetic complications: no Comments: Late entry   No notable events documented.   Last Vitals:  Vitals:   10/14/23 1128 10/14/23 1132  BP: (!) 101/51 107/62  Pulse: 72   Resp: 18   Temp: (!) 36.4 C   SpO2: 100%     Last Pain:  Vitals:   10/14/23 1128  TempSrc: Oral  PainSc: 0-No pain                 Coretha Dew

## 2023-12-10 ENCOUNTER — Other Ambulatory Visit: Payer: Self-pay | Admitting: Cardiology

## 2024-01-06 ENCOUNTER — Ambulatory Visit (HOSPITAL_COMMUNITY)
Admission: RE | Admit: 2024-01-06 | Discharge: 2024-01-06 | Disposition: A | Discharge status: Home / Self Care | Payer: Medicare Other | Source: Ambulatory Visit | Attending: Oncology | Admitting: Oncology

## 2024-01-06 ENCOUNTER — Encounter: Payer: Self-pay | Admitting: Gastroenterology

## 2024-01-06 ENCOUNTER — Encounter (HOSPITAL_COMMUNITY): Payer: Self-pay

## 2024-01-06 ENCOUNTER — Encounter: Payer: Self-pay | Admitting: Internal Medicine

## 2024-01-06 DIAGNOSIS — Z1231 Encounter for screening mammogram for malignant neoplasm of breast: Secondary | ICD-10-CM | POA: Insufficient documentation

## 2024-01-15 ENCOUNTER — Other Ambulatory Visit: Payer: Self-pay

## 2024-01-15 DIAGNOSIS — Z1231 Encounter for screening mammogram for malignant neoplasm of breast: Secondary | ICD-10-CM

## 2024-01-15 DIAGNOSIS — E079 Disorder of thyroid, unspecified: Secondary | ICD-10-CM

## 2024-01-15 DIAGNOSIS — D649 Anemia, unspecified: Secondary | ICD-10-CM

## 2024-01-15 DIAGNOSIS — E559 Vitamin D deficiency, unspecified: Secondary | ICD-10-CM

## 2024-01-16 ENCOUNTER — Inpatient Hospital Stay: Payer: Medicare Other | Attending: Hematology

## 2024-01-16 DIAGNOSIS — D649 Anemia, unspecified: Secondary | ICD-10-CM | POA: Diagnosis not present

## 2024-01-16 DIAGNOSIS — E559 Vitamin D deficiency, unspecified: Secondary | ICD-10-CM | POA: Diagnosis not present

## 2024-01-16 DIAGNOSIS — Z08 Encounter for follow-up examination after completed treatment for malignant neoplasm: Secondary | ICD-10-CM | POA: Diagnosis not present

## 2024-01-16 DIAGNOSIS — I890 Lymphedema, not elsewhere classified: Secondary | ICD-10-CM | POA: Diagnosis not present

## 2024-01-16 DIAGNOSIS — Z9012 Acquired absence of left breast and nipple: Secondary | ICD-10-CM | POA: Insufficient documentation

## 2024-01-16 DIAGNOSIS — E079 Disorder of thyroid, unspecified: Secondary | ICD-10-CM | POA: Diagnosis not present

## 2024-01-16 DIAGNOSIS — Z853 Personal history of malignant neoplasm of breast: Secondary | ICD-10-CM | POA: Diagnosis not present

## 2024-01-16 DIAGNOSIS — Z1231 Encounter for screening mammogram for malignant neoplasm of breast: Secondary | ICD-10-CM

## 2024-01-16 DIAGNOSIS — R1310 Dysphagia, unspecified: Secondary | ICD-10-CM | POA: Diagnosis not present

## 2024-01-16 LAB — CBC WITH DIFFERENTIAL/PLATELET
Abs Immature Granulocytes: 0.02 K/uL (ref 0.00–0.07)
Basophils Absolute: 0.1 K/uL (ref 0.0–0.1)
Basophils Relative: 1 %
Eosinophils Absolute: 0.2 K/uL (ref 0.0–0.5)
Eosinophils Relative: 2 %
HCT: 38.2 % (ref 36.0–46.0)
Hemoglobin: 12.2 g/dL (ref 12.0–15.0)
Immature Granulocytes: 0 %
Lymphocytes Relative: 36 %
Lymphs Abs: 2.5 K/uL (ref 0.7–4.0)
MCH: 31.9 pg (ref 26.0–34.0)
MCHC: 31.9 g/dL (ref 30.0–36.0)
MCV: 99.7 fL (ref 80.0–100.0)
Monocytes Absolute: 0.5 K/uL (ref 0.1–1.0)
Monocytes Relative: 7 %
Neutro Abs: 3.7 K/uL (ref 1.7–7.7)
Neutrophils Relative %: 54 %
Platelets: 165 K/uL (ref 150–400)
RBC: 3.83 MIL/uL — ABNORMAL LOW (ref 3.87–5.11)
RDW: 13.2 % (ref 11.5–15.5)
WBC: 6.9 K/uL (ref 4.0–10.5)
nRBC: 0 % (ref 0.0–0.2)

## 2024-01-16 LAB — COMPREHENSIVE METABOLIC PANEL WITH GFR
ALT: 16 U/L (ref 0–44)
AST: 20 U/L (ref 15–41)
Albumin: 4 g/dL (ref 3.5–5.0)
Alkaline Phosphatase: 56 U/L (ref 38–126)
Anion gap: 10 (ref 5–15)
BUN: 17 mg/dL (ref 8–23)
CO2: 27 mmol/L (ref 22–32)
Calcium: 9.3 mg/dL (ref 8.9–10.3)
Chloride: 101 mmol/L (ref 98–111)
Creatinine, Ser: 0.79 mg/dL (ref 0.44–1.00)
GFR, Estimated: >60 mL/min (ref >60)
Glucose, Bld: 98 mg/dL (ref 70–99)
Potassium: 4.7 mmol/L (ref 3.5–5.1)
Sodium: 138 mmol/L (ref 135–145)
Total Bilirubin: 1.6 mg/dL — ABNORMAL HIGH (ref 0.0–1.2)
Total Protein: 7.7 g/dL (ref 6.5–8.1)

## 2024-01-16 LAB — VITAMIN D 25 HYDROXY (VIT D DEFICIENCY, FRACTURES): Vit D, 25-Hydroxy: 35.83 ng/mL (ref 30–100)

## 2024-01-16 LAB — IRON AND TIBC
Iron: 59 ug/dL (ref 28–170)
Saturation Ratios: 21 % (ref 10.4–31.8)
TIBC: 277 ug/dL (ref 250–450)
UIBC: 218 ug/dL

## 2024-01-16 LAB — FERRITIN: Ferritin: 87 ng/mL (ref 11–307)

## 2024-01-23 ENCOUNTER — Inpatient Hospital Stay (HOSPITAL_BASED_OUTPATIENT_CLINIC_OR_DEPARTMENT_OTHER): Payer: Medicare Other | Admitting: Oncology

## 2024-01-23 VITALS — BP 148/73 | HR 62 | Temp 97.8°F | Resp 18 | Wt 130.3 lb

## 2024-01-23 DIAGNOSIS — D649 Anemia, unspecified: Secondary | ICD-10-CM

## 2024-01-23 DIAGNOSIS — Z1231 Encounter for screening mammogram for malignant neoplasm of breast: Secondary | ICD-10-CM | POA: Diagnosis not present

## 2024-01-23 DIAGNOSIS — C50912 Malignant neoplasm of unspecified site of left female breast: Secondary | ICD-10-CM | POA: Diagnosis not present

## 2024-01-23 DIAGNOSIS — E079 Disorder of thyroid, unspecified: Secondary | ICD-10-CM | POA: Diagnosis not present

## 2024-01-23 DIAGNOSIS — R1310 Dysphagia, unspecified: Secondary | ICD-10-CM | POA: Insufficient documentation

## 2024-01-23 DIAGNOSIS — Z853 Personal history of malignant neoplasm of breast: Secondary | ICD-10-CM | POA: Diagnosis not present

## 2024-01-23 DIAGNOSIS — E559 Vitamin D deficiency, unspecified: Secondary | ICD-10-CM | POA: Diagnosis not present

## 2024-01-23 DIAGNOSIS — Z08 Encounter for follow-up examination after completed treatment for malignant neoplasm: Secondary | ICD-10-CM | POA: Diagnosis not present

## 2024-01-23 NOTE — Progress Notes (Signed)
 [name] OFFICE PROGRESS NOTE  Bertell Satterfield, MD  ASSESSMENT & PLAN:  Assessment & Plan Malignant neoplasm of left female breast, unspecified estrogen receptor status, unspecified site of breast (HCC) - S/P left total mastectomy with sentinel node sampling on 03/21/2010 at which time 0.4 cm and 0.2 cm tumors were found, both triple negative. She received no postoperative therapy. - Most recent mammogram, unilateral right breast (01/06/2024): BI-RADS Category 1, negative.  No evidence of malignancy in the right breast. - Breast exam without evidence of abnormality. - ROS negative for any red flag symptoms of recurrence. - Most recent labs from 01/16/2024 which showed an unremarkable CBC and CMP.  She has had intermittently elevated total bilirubin's over the past 10 years.  Most recent total bili was 1.6. -Recommend repeat mammogram in 1 year. Dysphagia, unspecified type - Patient reports she recently had her esophagus stretched. -Over the past few weeks she has been getting pills stuck in her throat which makes her want to cough. -She has not had follow-up with GI after her recent EGD. Vitamin D  deficiency - She is currently taking 50,000 units of ergocalciferol  weekly.  Reports she occasionally will forget to take it. -Recommend she continue for now.  Vitamin D  levels are within normal limits. Thyroid  disorder  Breast cancer screening by mammogram  Normocytic anemia     No orders of the defined types were placed in this encounter.   INTERVAL HISTORY: Patient returns for follow-up for breast cancer.  Most recent mammogram from 01/06/2024 was BI-RADS Category 1 negative.  Reports she recently had her esophagus stretched and has not had follow-up but feels like pills are getting stuck in her throat.  Previously, reported lower extremity swelling but this has resolved.  She is doing Exercises which seems to be helping.  She is also wearing TED hose.  She was  previously having lymphedema in her left arm but she has met with the lymphedema specialist to offered her exercises as well as a compression sleeve and garment for her bra.  Swelling has improved in her arm.  Appetite and energy levels are 100%.  She has pain at 5 out of 10 in her back and lower abdomen at times.  Takes iron supplement daily.  We reviewed CBC, ferritin, iron panel, CMP and vitamin D   SUMMARY OF HEMATOLOGIC HISTORY:  S/P left total mastectomy with sentinel node sampling on 03/21/2010 at which time 0.4 cm and 0.2 cm tumors were found, both triple negative. She received no postoperative therapy.   She continues to receive annual breast exams and mammograms with us  here at the cancer center.  No results found for: CBC  Vitals:   01/23/24 1101  BP: (!) 148/73  Pulse: 62  Resp: 18  Temp: 97.8 F (36.6 C)  SpO2: 100%   Review of Systems  Musculoskeletal:  Positive for back pain.  Neurological:  Positive for dizziness.   Physical Exam Constitutional:      Appearance: Normal appearance.  Cardiovascular:     Rate and Rhythm: Normal rate and regular rhythm.  Pulmonary:     Effort: Pulmonary effort is normal.     Breath sounds: Normal breath sounds.  Chest:  Breasts:    Right: Normal.     Left: Absent.     Comments: Breast exam unremarkable.  No new concerning lumps or lymphadenopathy. Abdominal:     General: Bowel sounds are normal.     Palpations: Abdomen is soft.  Musculoskeletal:  General: No swelling. Normal range of motion.  Neurological:     Mental Status: She is alert and oriented to person, place, and time. Mental status is at baseline.     I spent 25 minutes dedicated to the care of this patient (face-to-face and non-face-to-face) on the date of the encounter to include what is described in the assessment and plan.,  Delon Hope, NP 01/23/2024 11:36 AM

## 2024-01-23 NOTE — Assessment & Plan Note (Addendum)
-   She is currently taking 50,000 units of ergocalciferol  weekly.  Reports she occasionally will forget to take it. -Recommend she continue for now.  Vitamin D  levels are within normal limits.

## 2024-01-23 NOTE — Assessment & Plan Note (Addendum)
-   S/P left total mastectomy with sentinel node sampling on 03/21/2010 at which time 0.4 cm and 0.2 cm tumors were found, both triple negative. She received no postoperative therapy. - Most recent mammogram, unilateral right breast (01/06/2024): BI-RADS Category 1, negative.  No evidence of malignancy in the right breast. - Breast exam without evidence of abnormality. - ROS negative for any red flag symptoms of recurrence. - Most recent labs from 01/16/2024 which showed an unremarkable CBC and CMP.  She has had intermittently elevated total bilirubin's over the past 10 years.  Most recent total bili was 1.6. -Recommend repeat mammogram in 1 year.

## 2024-01-23 NOTE — Assessment & Plan Note (Addendum)
-   Patient reports she recently had her esophagus stretched. -Over the past few weeks she has been getting pills stuck in her throat which makes her want to cough. -She has not had follow-up with GI after her recent EGD.

## 2024-01-27 NOTE — Progress Notes (Unsigned)
 Referring Provider: Bertell Satterfield, MD Primary Care Physician:  Bertell Satterfield, MD Primary GI Physician: Dr. Cindie  Chief Complaint  Patient presents with   Follow-up    Follow up on dysphagia. Also with LLQ abdominal pain.     HPI:   [name] is a 89 y.o. female with history of breast cancer, CAD, MI, HTN, HLD, hypothyroidism, GERD, presenting today for follow-up of esophageal wall thickening noted on CT in March 2025 and dysphagia.  Also reporting LLQ abdominal pain.  Last seen in the office 09/25/2023.  Prior CT 08/29/2023 that showed mild symmetric distal esophageal wall thickening. Patient reported intermittent pill dysphagia, but no issues with foods or liquids. Denies odynophagia. Chronic GERD well-controlled on pantoprazole  daily. Denies recent unintentional weight loss, BRBPR, melena, or other significant symptoms.  She was scheduled for an EGD.  Also noted some left lower quadrant tenderness to palpation on her exam.  She had no pain unless I was palpating the area and symptoms were very mild.  She had no significant lower GI symptoms.  Discussed possibility of a colonoscopy, but patient preferred to avoid this if possible.  Recommended adding fiber and monitoring symptoms.  EGD completed 10/14/2023 showing small hiatal hernia, mild Schatzki's ring dilated, otherwise normal exam.  Today:  Dysphagia resolved following esophageal dilation.   GERD is well controlled.   No nausea or vomiting. Eating well.   Having LLQ abdominal pain radiating to her flank.  This has been an intermittent issue for her for about 6 months or so.  States she was prescribed antibiotic by her PCP at some point for possible diverticulitis which did help her symptoms, but symptoms have come back intermittently for the last 3 months.  Very difficult to get clear history around her symptoms, but seems that they occur at least a couple times a week.  She does have increased bowel frequency when she has  the pain, but not necessarily diarrhea.  Stools with mushy.  May have 3 or 4, then will take Imodium.  On the other days, she will have 1 or 2 normal bowel movements per day.  No BRBPR or melena.  She also reports that her abdominal pain is sometimes worse following being active outside working in her flower garden.  She does report that dairy will cause her to have loose stools, but she is recently switched to lactose-free ice cream and does not eat other dairy products.  However, after diet recall around the time of her last 2 episodes of loose stool, she had chicken salad with mayonnaise, and fried fish.   CT A/P with contrast 08/29/2023 with colonic diverticulosis without diverticulitis.  CT chest abdomen pelvis in August 2022 noting postoperative findings of low anterior resection and reanastomosis.   I questioned patient today regarding bowel surgery, but she cannot recall what this was for or when it was performed.  Past Medical History:  Diagnosis Date   Anemia    Arthritis    Breast cancer (HCC) 2011   left; no adjuvent therapy, Dr. Tona   Chronic bronchitis    Colon polyps    Previous colonoscopy by Dr. Keven Sharps   Coronary artery disease, non-occlusive 2006   Roughly 40% RCA.; Negative Myoview  in June 2013   Diverticulosis of colon    Gout    Hemorrhoids    HTN (hypertension)    Hyperlipidemia    Hypothyroid    MI (myocardial infarction) (HCC) 1973   By report.  Past Surgical History:  Procedure Laterality Date   ABDOMINAL HYSTERECTOMY     TAH   CARDIAC CATHETERIZATION  06/11/2004   Roughly 40% RCA lesion, otherwise normal.   CATARACT EXTRACTION W/PHACO  07/02/2011   Procedure: CATARACT EXTRACTION PHACO AND INTRAOCULAR LENS PLACEMENT (IOC);  Surgeon: Dow JULIANNA Burke, MD;  Location: [facility];  Service: Ophthalmology;  Laterality: Left;  CDE:3.45   CATARACT EXTRACTION W/PHACO  04/17/2012   Procedure: CATARACT EXTRACTION PHACO AND INTRAOCULAR LENS PLACEMENT (IOC);   Surgeon: Cherene Mania, MD;  Location: [facility];  Service: Ophthalmology;  Laterality: Right;  CDE:13.40   CHOLECYSTECTOMY     COLONOSCOPY      ?3 years ago. Per pt, diverticulosis, hemorrhoids   COLONOSCOPY  04/06/2011   SLF: 1. Sessile polyp at the ileocecal valve 2. Diverticulosis, moderate 3. Internal hemorrhoids, Large-causing rectal bleeding associated with straining. tubular adenoma.   ESOPHAGEAL DILATION N/A 10/14/2023   Procedure: DILATION, ESOPHAGUS;  Surgeon: Cindie Carlin POUR, DO;  Location: [facility];  Service: Endoscopy;  Laterality: N/A;   ESOPHAGOGASTRODUODENOSCOPY N/A 10/14/2023   Procedure: EGD (ESOPHAGOGASTRODUODENOSCOPY);  Surgeon: Cindie Carlin POUR, DO;  Location: [facility];  Service: Endoscopy;  Laterality: N/A;  10:30 am, asa 3   HEMORRHOID SURGERY     LAPAROSCOPIC LYSIS OF ADHESIONS  06/11/2006   Dr. Lily with repair of incisional hernia noted -    MASTECTOMY  06/11/2009   left   NM MYOVIEW  LTD  06/01/2021   Reduced LVEF of 40 to 45%.  No evidence of ischemia or infarction.  Read as intermediate risk because of reduced function.   TRANSTHORACIC ECHOCARDIOGRAM  12/2013   a) June 2013: Normal EF, no significant valve disease.; b) July 2015: EF 50-55%, Gr1 DD, mild LVH.   TRANSTHORACIC ECHOCARDIOGRAM  06/28/2021   Ordered due to low EF on Myoview :  EF 40 to 45%.  Mildly decreased function.  No RWMA.  GR 1 DD-mild LA dilation..  Normal PASP with mildly elevated RAP.  Normal RV function.    Current Outpatient Medications  Medication Sig Dispense Refill   allopurinol  (ZYLOPRIM ) 300 MG tablet Take 300 mg by mouth daily.       amLODipine  (NORVASC ) 5 MG tablet TAKE 1 TABLET DAILY (DISCONTINUE 10 MG PRESCRIPTION) 90 tablet 0   aspirin  EC 81 MG tablet Take 81 mg by mouth daily.     atorvastatin  (LIPITOR) 20 MG tablet TAKE (1) TABLET BY MOUTH AT BEDTIME FOR CHOLESTEROL. 90 tablet 3   enalapril  (VASOTEC ) 10 MG tablet Take 10 mg by mouth daily.     ergocalciferol  (VITAMIN  D2) 1.25 MG (50000 UT) capsule Take 1 capsule (50,000 Units total) by mouth once a week. 5 capsule 11   furosemide  (LASIX ) 20 MG tablet Take 1 tablet (20 mg total) by mouth daily. Ok to take additional 20 mg AS NEEDED 120 tablet 3   levothyroxine  (SYNTHROID , LEVOTHROID) 50 MCG tablet Take 1 tablet (50 mcg total) by mouth daily. 90 tablet 3   metoprolol  succinate (TOPROL -XL) 50 MG 24 hr tablet Take 50 mg by mouth at bedtime. Take with or immediately following a meal.     Multiple Vitamins-Minerals (MULTIVITAMINS THER. W/MINERALS) TABS Take 1 tablet by mouth daily.       nitroGLYCERIN  (NITROSTAT ) 0.4 MG SL tablet DISSOLVE 1 TABLET UNDER TONGUE EVERY 5 MINUTES AS NEEDED FOR CHEST PAIN. 25 tablet 4   pantoprazole  (PROTONIX ) 40 MG tablet TAKE ONE TABLET BY MOUTH DAILY. 90 tablet 3   potassium  chloride SA (KLOR-CON  M) 20 MEQ tablet TAKE 1 TABLET DAILY (PLEASE CALL MD OFFICE TO SCHEDULE A YEARLY APPT WITH DR HARDING FOR MARCH 2025 BEFORE ANYMORE REFILLS [id number]) 90 tablet 3   Propylene Glycol 0.6 % SOLN Place 1 drop into both eyes 2 (two) times daily.     No current facility-administered medications for this visit.    Allergies as of 01/29/2024 - Review Complete 01/29/2024  Allergen Reaction Noted   Bee venom Anaphylaxis 10/22/2011   Ciprofloxacin Nausea And Vomiting 03/14/2011   Clindamycin  01/23/2024   Clindamycin hcl  03/14/2011   Metronidazole hcl  01/23/2024   Shellfish-derived products  03/26/2011   Sulfa antibiotics Nausea Only 03/14/2011    Family History  Problem Relation Age of Onset   Prostate cancer Father    Breast cancer Sister    Breast cancer Other        paternal niece   Colon cancer Neg Hx    Anesthesia problems Neg Hx    Hypotension Neg Hx    Malignant hyperthermia Neg Hx    Pseudochol deficiency Neg Hx    Liver disease Neg Hx     Social History   Socioeconomic History   Marital status: Widowed    Spouse name: Not on file   Number of children: 2   Years  of education: Not on file   Highest education level: Not on file  Occupational History   Occupation: retired    Associate Professor: RETIRED  Tobacco Use   Smoking status: Never   Smokeless tobacco: Never  Substance and Sexual Activity   Alcohol  use: No   Drug use: No   Sexual activity: Not Currently  Other Topics Concern   Not on file  Social History Narrative   She is a very pleasant recently widowed African American woman with 2 children and 3 grandchildren.       She is married to her husband for close to 60 years.  He was a retired Advice worker.     [place] continues to [place] in the [place] area with a roughly 1/4 mile driveway.    She also enjoys doing gardening.   Social Drivers of Corporate investment banker Strain: Not on file  Food Insecurity: No Food Insecurity (03/19/2022)   Hunger Vital Sign    Worried About Running Out of Food in the Last Year: Never true    Ran Out of Food in the Last Year: Never true  Transportation Needs: No Transportation Needs (03/19/2022)   PRAPARE - Administrator, Civil Service (Medical): No    Lack of Transportation (Non-Medical): No  Physical Activity: Not on file  Stress: Not on file  Social Connections: Not on file    Review of Systems: Gen: Denies fever, chills, cold-like symptoms, presyncope, syncope. CV: Denies chest pain, palpitations. Resp: Denies dyspnea, cough. GI: See HPI Heme: See HPI  Physical Exam: BP 134/74 (BP Location: Right Arm, Patient Position: Sitting, Cuff Size: Large)   Pulse 66   Temp 97.7 F (36.5 C) (Temporal)   Ht 5' (1.524 m)   Wt 131 lb 6.4 oz (59.6 kg)   BMI 25.66 kg/m  General:   Alert and oriented. No distress noted. Pleasant and cooperative.  Head:  Normocephalic and atraumatic. Eyes:  Conjuctiva clear without scleral icterus. Abdomen:  +BS, soft, and non-distended. Mild generalized TTP, but primarily in the LLQ. No rebound or guarding. No HSM or masses  noted.  Msk:  Symmetrical without gross deformities. Normal posture. Extremities:  Without edema. Neurologic:  Alert and  oriented x4 Psych:  Normal mood and affect.    Assessment:  89 y.o. female with history of breast cancer, CAD, MI, HTN, HLD, hypothyroidism, GERD, presenting today for follow-up of esophageal wall thickening noted on CT in March 2025 and dysphagia.  Also reporting LLQ abdominal pain.  Dysphagia:  Resolved following dilation of mild Schatzki's ring at the time of EGD 10/14/2023.  Esophageal wall thickening: CT A/P with contrast 08/29/2023 with mild symmetric distal esophageal wall thickening.  Suspect this was likely related to Schatzki's ring that was noted on EGD 10/14/2023.  Otherwise, no significant abnormalities aside from a small hiatal hernia seen on EGD.  LLQ abdominal pain: Intermittent for the last 6 months.  Patient reports PCP previously prescribed antibiotics for diverticulitis with resolution of symptoms, but returned about 3 months ago and has been an ongoing intermittent issue at least a couple times a week.  She does seem to note that bowels are looser on these days, but her pain is not particularly affected by the bowel movement and she denies BRBPR or melena.  Interestingly, she reports pain may be affected by activity, working out in her flower garden.  She does have mild generalized TTP on exam today, primarily in LLQ region.  Prior CT in March 2025 with diverticulosis without diverticulitis.  CT from August 2022 noting postoperative findings of low anterior resection and reanastomosis, but patient does not recall any details regarding this.  Last colonoscopy in 2012 with 1 tubular adenoma removed.  Etiology is unclear.  As she does have abdominal pain on exam today, will plan for CT A/P to rule out diverticulitis.  I do wonder if her loose bowels/mild bowel irritability may be diet related, possibly secondary to bile salt diarrhea as after diet recall today, it  seems that her symptoms this past week have been related to consumption of fattier food. Could also have IBS. If CT is unrevealing, patient will keep a diet log to see if she can identify some triggers, but if symptoms persist despite diet changes, may need to consider updating a colonoscopy.    Plan:  CT A/P with contrast.  Requested patient to keep a log of when she has oose stool or abdominal pain and the foods that were consumed prior to onset to help identify potential triggers.  Follow-up in 3 months or sooner if needed.    Josette Centers, PA-C [facility] 01/29/2024

## 2024-01-29 ENCOUNTER — Ambulatory Visit (INDEPENDENT_AMBULATORY_CARE_PROVIDER_SITE_OTHER): Admitting: Gastroenterology

## 2024-01-29 ENCOUNTER — Telehealth: Payer: Self-pay | Admitting: *Deleted

## 2024-01-29 ENCOUNTER — Encounter: Payer: Self-pay | Admitting: Gastroenterology

## 2024-01-29 VITALS — BP 134/74 | HR 66 | Temp 97.7°F | Ht 60.0 in | Wt 131.4 lb

## 2024-01-29 DIAGNOSIS — K449 Diaphragmatic hernia without obstruction or gangrene: Secondary | ICD-10-CM | POA: Diagnosis not present

## 2024-01-29 DIAGNOSIS — B029 Zoster without complications: Secondary | ICD-10-CM | POA: Diagnosis not present

## 2024-01-29 DIAGNOSIS — R933 Abnormal findings on diagnostic imaging of other parts of digestive tract: Secondary | ICD-10-CM | POA: Diagnosis not present

## 2024-01-29 DIAGNOSIS — R1032 Left lower quadrant pain: Secondary | ICD-10-CM

## 2024-01-29 DIAGNOSIS — M5030 Other cervical disc degeneration, unspecified cervical region: Secondary | ICD-10-CM | POA: Diagnosis not present

## 2024-01-29 DIAGNOSIS — R197 Diarrhea, unspecified: Secondary | ICD-10-CM

## 2024-01-29 DIAGNOSIS — Z860101 Personal history of adenomatous and serrated colon polyps: Secondary | ICD-10-CM

## 2024-01-29 DIAGNOSIS — Z6825 Body mass index (BMI) 25.0-25.9, adult: Secondary | ICD-10-CM | POA: Diagnosis not present

## 2024-01-29 DIAGNOSIS — I10 Essential (primary) hypertension: Secondary | ICD-10-CM | POA: Diagnosis not present

## 2024-01-29 NOTE — Telephone Encounter (Signed)
 LMOVM to call back to give CT appt details. 8/21, arrival 9:15am to start drinking oral contrast

## 2024-01-29 NOTE — Patient Instructions (Signed)
 We will arrange to have a CT scan of your abdomen and pelvis to further evaluate your abdominal pain.  I would like for you to keep a log of when you have loose stool or abdominal pain and the foods that you ate prior to symptoms occurring to see if we can identify a specific trigger.  It is very possible that your intermittent loose stool is related to the fact that you do not have a gallbladder anymore.  Fattier foods will tend to cause loose bowel movements.  Will plan to see you back in 3 months, but if anything significant shows up on the CT scan, we will have additional recommendations.  Josette Centers, PA-C [facility]

## 2024-01-29 NOTE — Telephone Encounter (Signed)
 LMOVM to call back

## 2024-01-30 ENCOUNTER — Ambulatory Visit (HOSPITAL_COMMUNITY): Admission: RE | Admit: 2024-01-30 | Source: Ambulatory Visit

## 2024-02-07 ENCOUNTER — Ambulatory Visit (HOSPITAL_COMMUNITY)
Admission: RE | Admit: 2024-02-07 | Discharge: 2024-02-07 | Disposition: A | Discharge status: Home / Self Care | Source: Ambulatory Visit | Attending: Gastroenterology | Admitting: Gastroenterology

## 2024-02-07 DIAGNOSIS — Z90710 Acquired absence of both cervix and uterus: Secondary | ICD-10-CM | POA: Diagnosis not present

## 2024-02-07 DIAGNOSIS — R197 Diarrhea, unspecified: Secondary | ICD-10-CM | POA: Insufficient documentation

## 2024-02-07 DIAGNOSIS — K5730 Diverticulosis of large intestine without perforation or abscess without bleeding: Secondary | ICD-10-CM | POA: Diagnosis not present

## 2024-02-07 DIAGNOSIS — R1032 Left lower quadrant pain: Secondary | ICD-10-CM | POA: Insufficient documentation

## 2024-02-07 MED ORDER — IOHEXOL 300 MG/ML  SOLN
100.0000 mL | Freq: Once | INTRAMUSCULAR | Status: AC | PRN
  Administered 2024-02-07: 100 mL via INTRAVENOUS

## 2024-02-11 ENCOUNTER — Ambulatory Visit: Payer: Self-pay | Admitting: Gastroenterology

## 2024-02-17 ENCOUNTER — Other Ambulatory Visit (HOSPITAL_BASED_OUTPATIENT_CLINIC_OR_DEPARTMENT_OTHER): Payer: Self-pay

## 2024-02-17 ENCOUNTER — Other Ambulatory Visit: Payer: Self-pay

## 2024-02-17 ENCOUNTER — Other Ambulatory Visit (HOSPITAL_COMMUNITY): Payer: Self-pay

## 2024-02-17 ENCOUNTER — Other Ambulatory Visit: Payer: Self-pay | Admitting: Cardiology

## 2024-02-17 MED ORDER — ERGOCALCIFEROL 1.25 MG (50000 UT) PO CAPS
50000.0000 [IU] | ORAL_CAPSULE | ORAL | 3 refills | Status: AC
  Filled 2024-02-21 – 2024-03-11 (×2): qty 12, 84d supply, fill #0

## 2024-02-17 MED ORDER — POTASSIUM CHLORIDE ER 20 MEQ PO TBCR
20.0000 meq | EXTENDED_RELEASE_TABLET | Freq: Every day | ORAL | 3 refills | Status: DC
  Filled 2024-02-19 – 2024-02-21 (×2): qty 90, 90d supply, fill #0
  Filled 2024-03-11: qty 30, 30d supply, fill #0
  Filled 2024-04-02 – 2024-04-06 (×2): qty 30, 30d supply, fill #1
  Filled 2024-04-29: qty 30, 30d supply, fill #2

## 2024-02-17 MED ORDER — ENALAPRIL MALEATE 10 MG PO TABS
10.0000 mg | ORAL_TABLET | Freq: Every day | ORAL | 5 refills | Status: AC
  Filled 2024-02-19 (×2): qty 30, 30d supply, fill #0
  Filled 2024-02-21 – 2024-03-11 (×2): qty 30, 30d supply, fill #1

## 2024-02-19 ENCOUNTER — Other Ambulatory Visit (HOSPITAL_BASED_OUTPATIENT_CLINIC_OR_DEPARTMENT_OTHER): Payer: Self-pay

## 2024-02-19 ENCOUNTER — Other Ambulatory Visit (HOSPITAL_COMMUNITY): Payer: Self-pay

## 2024-02-19 ENCOUNTER — Other Ambulatory Visit: Payer: Self-pay

## 2024-02-19 ENCOUNTER — Telehealth: Payer: Self-pay | Admitting: Cardiology

## 2024-02-19 MED ORDER — AMLODIPINE BESYLATE 5 MG PO TABS
5.0000 mg | ORAL_TABLET | Freq: Every day | ORAL | 2 refills | Status: AC
  Filled 2024-02-19: qty 90, 90d supply, fill #0
  Filled 2024-02-21: qty 90, 90d supply, fill #1
  Filled 2024-04-29: qty 30, 30d supply, fill #1
  Filled 2024-06-08 – 2024-06-15 (×2): qty 30, 30d supply, fill #2
  Filled 2024-07-08: qty 30, 30d supply, fill #3
  Filled ????-??-??: fill #1

## 2024-02-19 MED ORDER — NITROGLYCERIN 0.4 MG SL SUBL
SUBLINGUAL_TABLET | SUBLINGUAL | 2 refills | Status: AC
  Filled 2024-02-19: qty 25, 7d supply, fill #0

## 2024-02-19 MED ORDER — LEVOTHYROXINE SODIUM 50 MCG PO TABS
50.0000 ug | ORAL_TABLET | Freq: Every day | ORAL | 0 refills | Status: DC
  Filled 2024-02-19 – 2024-02-21 (×2): qty 90, 90d supply, fill #0
  Filled 2024-03-11: qty 30, 30d supply, fill #0
  Filled 2024-04-02: qty 30, 30d supply, fill #1
  Filled 2024-04-03 – 2024-04-06 (×2): qty 30, 30d supply, fill #0
  Filled 2024-04-29: qty 30, 30d supply, fill #1

## 2024-02-19 MED ORDER — METOPROLOL SUCCINATE ER 50 MG PO TB24
50.0000 mg | ORAL_TABLET | Freq: Every day | ORAL | 0 refills | Status: DC
  Filled 2024-02-19 – 2024-02-21 (×2): qty 90, 90d supply, fill #0
  Filled 2024-03-11: qty 30, 30d supply, fill #0
  Filled 2024-04-02: qty 30, 30d supply, fill #1
  Filled 2024-04-03 – 2024-04-06 (×2): qty 30, 30d supply, fill #0
  Filled 2024-04-29: qty 30, 30d supply, fill #1

## 2024-02-19 MED ORDER — AMLODIPINE BESYLATE 10 MG PO TABS
10.0000 mg | ORAL_TABLET | Freq: Every day | ORAL | 0 refills | Status: DC
  Filled 2024-02-19 – 2024-02-21 (×2): qty 90, 90d supply, fill #0
  Filled ????-??-??: fill #0

## 2024-02-19 MED ORDER — ALLOPURINOL 300 MG PO TABS
300.0000 mg | ORAL_TABLET | Freq: Every day | ORAL | 0 refills | Status: DC
  Filled 2024-02-19 – 2024-02-21 (×2): qty 90, 90d supply, fill #0
  Filled 2024-03-11: qty 30, 30d supply, fill #0
  Filled 2024-04-02: qty 30, 30d supply, fill #1
  Filled 2024-04-03 – 2024-04-06 (×2): qty 30, 30d supply, fill #0
  Filled 2024-04-29: qty 30, 30d supply, fill #1

## 2024-02-19 MED ORDER — FUROSEMIDE 20 MG PO TABS
20.0000 mg | ORAL_TABLET | Freq: Every day | ORAL | 0 refills | Status: DC
  Filled 2024-02-19 – 2024-02-21 (×2): qty 90, 90d supply, fill #0
  Filled 2024-03-11: qty 30, 30d supply, fill #0
  Filled 2024-04-02: qty 30, 30d supply, fill #1
  Filled 2024-04-03 – 2024-04-06 (×2): qty 30, 30d supply, fill #0
  Filled 2024-04-29: qty 30, 30d supply, fill #1

## 2024-02-19 MED ORDER — PANTOPRAZOLE SODIUM 40 MG PO TBEC
40.0000 mg | DELAYED_RELEASE_TABLET | Freq: Every day | ORAL | 0 refills | Status: AC
  Filled 2024-02-19 – 2024-02-21 (×2): qty 90, 90d supply, fill #0
  Filled 2024-03-11: qty 30, 30d supply, fill #0
  Filled 2024-04-02: qty 30, 30d supply, fill #1
  Filled 2024-04-06: qty 30, 30d supply, fill #0
  Filled 2024-04-29: qty 30, 30d supply, fill #1
  Filled ????-??-??: fill #0

## 2024-02-19 MED ORDER — ATORVASTATIN CALCIUM 20 MG PO TABS
20.0000 mg | ORAL_TABLET | Freq: Every evening | ORAL | 0 refills | Status: DC
  Filled 2024-02-19 – 2024-02-21 (×2): qty 90, 90d supply, fill #0
  Filled 2024-03-11: qty 30, 30d supply, fill #0
  Filled 2024-04-02: qty 30, 30d supply, fill #1
  Filled 2024-04-03 – 2024-04-06 (×2): qty 30, 30d supply, fill #0
  Filled 2024-04-29: qty 30, 30d supply, fill #1

## 2024-02-19 NOTE — Telephone Encounter (Signed)
 Pt of Dr. Anner Please advise on if Dr. Anner would like to start refilling these.

## 2024-02-19 NOTE — Telephone Encounter (Signed)
*  STAT* If patient is at the pharmacy, call can be transferred to refill team.   1. Which medications need to be refilled? (please list name of each medication and dose if known)  metoprolol  succinate (TOPROL -XL) 50 MG 24 hr tablet   NEW PHARMACY   4. Which pharmacy/location (including street and city if local pharmacy) is medication to be sent to? [facility] Phone: [phone]  Fax: [phone]       5. Do they need a 30 day or 90 day supply? 90  Pt daughter states they think Dr. Bertell has been filling but asked if Dr. Anner can continue refills.

## 2024-02-20 ENCOUNTER — Other Ambulatory Visit (HOSPITAL_BASED_OUTPATIENT_CLINIC_OR_DEPARTMENT_OTHER): Payer: Self-pay

## 2024-02-20 ENCOUNTER — Other Ambulatory Visit: Payer: Self-pay

## 2024-02-21 ENCOUNTER — Other Ambulatory Visit (HOSPITAL_COMMUNITY): Payer: Self-pay

## 2024-02-21 ENCOUNTER — Other Ambulatory Visit (HOSPITAL_BASED_OUTPATIENT_CLINIC_OR_DEPARTMENT_OTHER): Payer: Self-pay

## 2024-02-21 ENCOUNTER — Other Ambulatory Visit: Payer: Self-pay

## 2024-02-22 NOTE — Telephone Encounter (Signed)
 Yes please-Toprol  is a medicine that we can prescribe.

## 2024-02-24 NOTE — Telephone Encounter (Signed)
 Called left message for daughter Channing.   Dr Anner will fell  prescription for Toprol  50 mg daily --  RN left message once the current prescription is  90 days complete ( started 02/19/24)  - call office for refill   Any question may call back

## 2024-03-02 ENCOUNTER — Other Ambulatory Visit: Payer: Self-pay

## 2024-03-04 ENCOUNTER — Other Ambulatory Visit (HOSPITAL_COMMUNITY): Payer: Self-pay

## 2024-03-04 ENCOUNTER — Other Ambulatory Visit: Payer: Self-pay

## 2024-03-11 ENCOUNTER — Other Ambulatory Visit (HOSPITAL_COMMUNITY): Payer: Self-pay

## 2024-03-11 ENCOUNTER — Other Ambulatory Visit: Payer: Self-pay

## 2024-03-12 ENCOUNTER — Other Ambulatory Visit: Payer: Self-pay

## 2024-03-13 ENCOUNTER — Other Ambulatory Visit: Payer: Self-pay

## 2024-03-26 ENCOUNTER — Encounter: Payer: Self-pay | Admitting: Gastroenterology

## 2024-04-01 ENCOUNTER — Other Ambulatory Visit: Payer: Self-pay

## 2024-04-01 ENCOUNTER — Other Ambulatory Visit (HOSPITAL_BASED_OUTPATIENT_CLINIC_OR_DEPARTMENT_OTHER): Payer: Self-pay

## 2024-04-02 ENCOUNTER — Other Ambulatory Visit: Payer: Self-pay

## 2024-04-02 ENCOUNTER — Other Ambulatory Visit (HOSPITAL_COMMUNITY): Payer: Self-pay

## 2024-04-03 ENCOUNTER — Other Ambulatory Visit: Payer: Self-pay

## 2024-04-03 ENCOUNTER — Other Ambulatory Visit (HOSPITAL_BASED_OUTPATIENT_CLINIC_OR_DEPARTMENT_OTHER): Payer: Self-pay

## 2024-04-06 ENCOUNTER — Other Ambulatory Visit: Payer: Self-pay

## 2024-04-06 ENCOUNTER — Other Ambulatory Visit: Payer: Self-pay | Admitting: Cardiology

## 2024-04-07 ENCOUNTER — Other Ambulatory Visit (HOSPITAL_COMMUNITY): Payer: Self-pay

## 2024-04-07 ENCOUNTER — Other Ambulatory Visit: Payer: Self-pay

## 2024-04-07 ENCOUNTER — Telehealth: Payer: Self-pay | Admitting: Cardiology

## 2024-04-07 MED ORDER — ENALAPRIL MALEATE 10 MG PO TABS
10.0000 mg | ORAL_TABLET | Freq: Every day | ORAL | 1 refills | Status: AC
  Filled 2024-04-07: qty 30, 30d supply, fill #0
  Filled 2024-04-07: qty 90, 90d supply, fill #0
  Filled 2024-04-29: qty 30, 30d supply, fill #1
  Filled 2024-06-08 – 2024-06-15 (×2): qty 30, 30d supply, fill #2
  Filled 2024-07-08: qty 30, 30d supply, fill #3

## 2024-04-07 NOTE — Telephone Encounter (Signed)
 90 day refill has been sent to [facility].

## 2024-04-07 NOTE — Telephone Encounter (Signed)
*  STAT* If patient is at the pharmacy, call can be transferred to refill team.   1. Which medications need to be refilled? (please list name of each medication and dose if known)   enalapril  (VASOTEC ) 10 MG tablet     4. Which pharmacy/location (including street and city if local pharmacy) is medication to be sent to?  [facility] Phone: [phone]  Fax: [phone]       5. Do they need a 30 day or 90 day supply? 90

## 2024-04-08 ENCOUNTER — Other Ambulatory Visit: Payer: Self-pay

## 2024-04-09 ENCOUNTER — Other Ambulatory Visit (HOSPITAL_COMMUNITY): Payer: Self-pay

## 2024-04-09 ENCOUNTER — Other Ambulatory Visit: Payer: Self-pay

## 2024-04-15 DIAGNOSIS — E042 Nontoxic multinodular goiter: Secondary | ICD-10-CM | POA: Diagnosis not present

## 2024-04-27 ENCOUNTER — Other Ambulatory Visit (HOSPITAL_BASED_OUTPATIENT_CLINIC_OR_DEPARTMENT_OTHER): Payer: Self-pay

## 2024-04-27 MED ORDER — LEVOTHYROXINE SODIUM 25 MCG PO TABS
25.0000 ug | ORAL_TABLET | Freq: Every morning | ORAL | 0 refills | Status: DC
  Filled 2024-04-27: qty 30, 30d supply, fill #0

## 2024-04-29 ENCOUNTER — Other Ambulatory Visit: Payer: Self-pay

## 2024-05-04 ENCOUNTER — Other Ambulatory Visit: Payer: Self-pay

## 2024-06-08 ENCOUNTER — Other Ambulatory Visit: Payer: Self-pay

## 2024-06-08 ENCOUNTER — Other Ambulatory Visit: Payer: Self-pay | Admitting: Cardiology

## 2024-06-09 ENCOUNTER — Other Ambulatory Visit: Payer: Self-pay

## 2024-06-09 MED ORDER — POTASSIUM CHLORIDE ER 20 MEQ PO TBCR
20.0000 meq | EXTENDED_RELEASE_TABLET | Freq: Every day | ORAL | 3 refills | Status: AC
  Filled 2024-06-09 – 2024-06-15 (×2): qty 30, 30d supply, fill #0
  Filled 2024-07-08: qty 30, 30d supply, fill #1

## 2024-06-09 NOTE — Telephone Encounter (Signed)
" °*  STAT* If patient is at the pharmacy, call can be transferred to refill team.   1. Which medications need to be refilled? (please list name of each medication and dose if known)  atorvastatin  (LIPITOR) 20 MG tablet   metoprolol  succinate (TOPROL -XL) 50 MG 24 hr tablet   furosemide  (LASIX ) 20 MG tablet    2. Would you like to learn more about the convenience, safety, & potential cost savings by using the [facility]?   3. Are you open to using the [facility] ([facility].   4. Which pharmacy/location (including street and city if local pharmacy) is medication to be sent to? [place] [facility]    5. Do they need a 30 day or 90 day supply? [id number]   Pharmacy calling also wanting to add these, needs to be sent ASAP because patient receives pill pack  "

## 2024-06-10 ENCOUNTER — Other Ambulatory Visit: Payer: Self-pay

## 2024-06-12 ENCOUNTER — Other Ambulatory Visit (HOSPITAL_BASED_OUTPATIENT_CLINIC_OR_DEPARTMENT_OTHER): Payer: Self-pay

## 2024-06-12 ENCOUNTER — Telehealth: Payer: Self-pay | Admitting: Cardiology

## 2024-06-12 ENCOUNTER — Other Ambulatory Visit: Payer: Self-pay

## 2024-06-12 MED ORDER — LEVOTHYROXINE SODIUM 25 MCG PO TABS
25.0000 ug | ORAL_TABLET | Freq: Every morning | ORAL | 3 refills | Status: AC
  Filled 2024-06-12 – 2024-06-15 (×2): qty 30, 30d supply, fill #0
  Filled 2024-07-08 – 2024-07-09 (×2): qty 30, 30d supply, fill #1

## 2024-06-12 MED ORDER — ATORVASTATIN CALCIUM 20 MG PO TABS
20.0000 mg | ORAL_TABLET | Freq: Every evening | ORAL | 0 refills | Status: AC
  Filled 2024-06-12 – 2024-06-15 (×2): qty 30, 30d supply, fill #0
  Filled 2024-07-08: qty 30, 30d supply, fill #1

## 2024-06-12 MED ORDER — FUROSEMIDE 20 MG PO TABS
20.0000 mg | ORAL_TABLET | Freq: Every day | ORAL | 0 refills | Status: AC
  Filled 2024-06-12 – 2024-06-15 (×2): qty 30, 30d supply, fill #0
  Filled 2024-07-08: qty 30, 30d supply, fill #1

## 2024-06-12 MED ORDER — METOPROLOL SUCCINATE ER 50 MG PO TB24
50.0000 mg | ORAL_TABLET | Freq: Every day | ORAL | 0 refills | Status: AC
  Filled 2024-06-12 – 2024-06-15 (×2): qty 30, 30d supply, fill #0
  Filled 2024-07-08: qty 30, 30d supply, fill #1

## 2024-06-12 NOTE — Addendum Note (Signed)
 Addended by: DARIO IZETTA CROME on: 06/12/2024 01:15 PM   Modules accepted: Orders

## 2024-06-12 NOTE — Telephone Encounter (Signed)
 OK TO FILL, PER PHARMACY PCP RETIRED AND OFFICE CLOSED  *STAT* If patient is at the pharmacy, call can be transferred to refill team.   1. Which medications need to be refilled? (please list name of each medication and dose if known)  allopurinol  (ZYLOPRIM ) 300 MG tablet 300 mg, Daily    2. Which pharmacy/location (including street and city if local pharmacy) is medication to be sent to? [place] [facility]    3. Do they need a 30 day or 90 day supply? 90

## 2024-06-15 ENCOUNTER — Other Ambulatory Visit: Payer: Self-pay

## 2024-06-16 ENCOUNTER — Other Ambulatory Visit (HOSPITAL_COMMUNITY): Payer: Self-pay

## 2024-06-16 ENCOUNTER — Other Ambulatory Visit: Payer: Self-pay

## 2024-06-16 MED ORDER — ALLOPURINOL 300 MG PO TABS
300.0000 mg | ORAL_TABLET | Freq: Every day | ORAL | 0 refills | Status: AC
  Filled 2024-06-16: qty 30, 30d supply, fill #0
  Filled 2024-06-16: qty 90, 90d supply, fill #0
  Filled 2024-06-16: qty 30, 30d supply, fill #0
  Filled 2024-07-08: qty 30, 30d supply, fill #1

## 2024-06-16 NOTE — Telephone Encounter (Signed)
"  Refill sent  "

## 2024-06-17 ENCOUNTER — Other Ambulatory Visit (HOSPITAL_BASED_OUTPATIENT_CLINIC_OR_DEPARTMENT_OTHER): Payer: Self-pay

## 2024-06-18 ENCOUNTER — Other Ambulatory Visit: Payer: Self-pay

## 2024-06-18 ENCOUNTER — Ambulatory Visit: Payer: Self-pay | Admitting: Pharmacist

## 2024-07-08 ENCOUNTER — Other Ambulatory Visit (HOSPITAL_BASED_OUTPATIENT_CLINIC_OR_DEPARTMENT_OTHER): Payer: Self-pay

## 2024-07-08 ENCOUNTER — Other Ambulatory Visit: Payer: Self-pay

## 2024-07-08 MED ORDER — ATORVASTATIN CALCIUM 20 MG PO TABS: 20.0000 mg | ORAL_TABLET | Freq: Every day | ORAL | 1 refills | Status: AC

## 2024-07-08 MED ORDER — METRONIDAZOLE 500 MG PO TABS
500.0000 mg | ORAL_TABLET | Freq: Three times a day (TID) | ORAL | 0 refills | Status: AC
  Filled 2024-07-08: qty 30, 10d supply, fill #0

## 2024-07-08 MED ORDER — AMLODIPINE BESYLATE 5 MG PO TABS: 5.0000 mg | ORAL_TABLET | Freq: Every day | ORAL | 1 refills | Status: AC

## 2024-07-08 MED ORDER — ENALAPRIL MALEATE 10 MG PO TABS: 10.0000 mg | ORAL_TABLET | Freq: Every day | ORAL | 1 refills | Status: AC

## 2024-07-08 MED ORDER — LEVOTHYROXINE SODIUM 25 MCG PO TABS: 25.0000 ug | ORAL_TABLET | Freq: Every day | ORAL | 1 refills | Status: AC

## 2024-07-08 MED ORDER — CIPROFLOXACIN HCL 500 MG PO TABS
500.0000 mg | ORAL_TABLET | Freq: Two times a day (BID) | ORAL | 0 refills | Status: AC
  Filled 2024-07-08: qty 14, 7d supply, fill #0

## 2024-07-08 MED ORDER — METOPROLOL SUCCINATE ER 50 MG PO TB24: 50.0000 mg | ORAL_TABLET | Freq: Every day | ORAL | 1 refills | Status: AC

## 2024-07-09 ENCOUNTER — Other Ambulatory Visit: Payer: Self-pay

## 2024-07-15 ENCOUNTER — Other Ambulatory Visit: Payer: Self-pay

## 2024-07-16 ENCOUNTER — Inpatient Hospital Stay

## 2024-07-16 DIAGNOSIS — E079 Disorder of thyroid, unspecified: Secondary | ICD-10-CM

## 2024-07-16 DIAGNOSIS — E559 Vitamin D deficiency, unspecified: Secondary | ICD-10-CM

## 2024-07-16 DIAGNOSIS — Z1231 Encounter for screening mammogram for malignant neoplasm of breast: Secondary | ICD-10-CM

## 2024-07-16 DIAGNOSIS — D649 Anemia, unspecified: Secondary | ICD-10-CM

## 2024-07-16 LAB — VITAMIN D 25 HYDROXY (VIT D DEFICIENCY, FRACTURES): Vit D, 25-Hydroxy: 35.2 ng/mL (ref 30–100)

## 2024-07-16 LAB — IRON AND TIBC
Iron: 91 ug/dL (ref 28–170)
Saturation Ratios: 32 % — ABNORMAL HIGH (ref 10.4–31.8)
TIBC: 286 ug/dL (ref 250–450)
UIBC: 195 ug/dL

## 2024-07-16 LAB — COMPREHENSIVE METABOLIC PANEL WITH GFR
ALT: 27 U/L (ref 0–44)
AST: 47 U/L — ABNORMAL HIGH (ref 15–41)
Albumin: 4.5 g/dL (ref 3.5–5.0)
Alkaline Phosphatase: 61 U/L (ref 38–126)
Anion gap: 14 (ref 5–15)
BUN: 15 mg/dL (ref 8–23)
CO2: 25 mmol/L (ref 22–32)
Calcium: 9.2 mg/dL (ref 8.9–10.3)
Chloride: 102 mmol/L (ref 98–111)
Creatinine, Ser: 0.83 mg/dL (ref 0.44–1.00)
GFR, Estimated: >60 mL/min
Glucose, Bld: 97 mg/dL (ref 70–99)
Potassium: 4 mmol/L (ref 3.5–5.1)
Sodium: 141 mmol/L (ref 135–145)
Total Bilirubin: 1.4 mg/dL — ABNORMAL HIGH (ref 0.0–1.2)
Total Protein: 7.5 g/dL (ref 6.5–8.1)

## 2024-07-16 LAB — CBC WITH DIFFERENTIAL/PLATELET
Abs Immature Granulocytes: 0.02 10*3/uL (ref 0.00–0.07)
Basophils Absolute: 0.1 10*3/uL (ref 0.0–0.1)
Basophils Relative: 1 %
Eosinophils Absolute: 0.1 10*3/uL (ref 0.0–0.5)
Eosinophils Relative: 2 %
HCT: 36.3 % (ref 36.0–46.0)
Hemoglobin: 11.5 g/dL — ABNORMAL LOW (ref 12.0–15.0)
Immature Granulocytes: 0 %
Lymphocytes Relative: 30 %
Lymphs Abs: 1.8 10*3/uL (ref 0.7–4.0)
MCH: 31.3 pg (ref 26.0–34.0)
MCHC: 31.7 g/dL (ref 30.0–36.0)
MCV: 98.9 fL (ref 80.0–100.0)
Monocytes Absolute: 0.5 10*3/uL (ref 0.1–1.0)
Monocytes Relative: 9 %
Neutro Abs: 3.4 10*3/uL (ref 1.7–7.7)
Neutrophils Relative %: 58 %
Platelets: 165 10*3/uL (ref 150–400)
RBC: 3.67 MIL/uL — ABNORMAL LOW (ref 3.87–5.11)
RDW: 14.1 % (ref 11.5–15.5)
Smear Review: NORMAL
WBC: 5.9 10*3/uL (ref 4.0–10.5)
nRBC: 0 % (ref 0.0–0.2)

## 2024-07-16 LAB — FERRITIN: Ferritin: 131 ng/mL (ref 11–307)

## 2024-07-23 ENCOUNTER — Inpatient Hospital Stay: Admitting: Oncology

## 2024-07-23 ENCOUNTER — Ambulatory Visit: Admitting: Oncology
